# Patient Record
Sex: Male | Born: 1950 | Race: Black or African American | Hispanic: No | Marital: Single | Smoking: Former smoker
Health system: Southern US, Community
[De-identification: ages and names within clinical notes are randomized; demographics above are authoritative.]

## PROBLEM LIST (undated history)

## (undated) DIAGNOSIS — G4730 Sleep apnea, unspecified: Secondary | ICD-10-CM

## (undated) DIAGNOSIS — E7800 Pure hypercholesterolemia, unspecified: Secondary | ICD-10-CM

## (undated) DIAGNOSIS — I10 Essential (primary) hypertension: Secondary | ICD-10-CM

## (undated) DIAGNOSIS — E119 Type 2 diabetes mellitus without complications: Secondary | ICD-10-CM

## (undated) HISTORY — DX: Sleep apnea, unspecified: G47.30

## (undated) HISTORY — PX: HERNIA REPAIR: SHX51

---

## 2002-04-09 DIAGNOSIS — R079 Chest pain, unspecified: Secondary | ICD-10-CM | POA: Insufficient documentation

## 2003-02-18 ENCOUNTER — Other Ambulatory Visit: Payer: Self-pay

## 2003-04-30 ENCOUNTER — Other Ambulatory Visit: Payer: Self-pay

## 2004-01-06 ENCOUNTER — Emergency Department: Payer: Self-pay | Admitting: Emergency Medicine

## 2004-04-22 ENCOUNTER — Ambulatory Visit: Payer: Self-pay

## 2004-05-02 ENCOUNTER — Emergency Department: Payer: Self-pay | Admitting: Emergency Medicine

## 2004-07-27 ENCOUNTER — Ambulatory Visit: Payer: Self-pay | Admitting: Internal Medicine

## 2004-08-07 ENCOUNTER — Emergency Department: Payer: Self-pay | Admitting: Emergency Medicine

## 2005-07-14 ENCOUNTER — Emergency Department: Payer: Self-pay | Admitting: Internal Medicine

## 2005-10-13 ENCOUNTER — Emergency Department: Payer: Self-pay | Admitting: General Practice

## 2006-02-07 ENCOUNTER — Emergency Department: Payer: Self-pay | Admitting: Emergency Medicine

## 2006-07-07 ENCOUNTER — Emergency Department: Payer: Self-pay | Admitting: Emergency Medicine

## 2006-08-01 ENCOUNTER — Emergency Department: Payer: Self-pay | Admitting: Emergency Medicine

## 2006-08-25 ENCOUNTER — Emergency Department: Payer: Self-pay | Admitting: Emergency Medicine

## 2006-09-07 ENCOUNTER — Emergency Department: Payer: Self-pay

## 2006-10-28 ENCOUNTER — Ambulatory Visit: Payer: Self-pay | Admitting: Family Medicine

## 2006-12-24 ENCOUNTER — Emergency Department: Payer: Self-pay

## 2007-01-15 ENCOUNTER — Emergency Department: Payer: Self-pay | Admitting: Internal Medicine

## 2007-01-15 ENCOUNTER — Other Ambulatory Visit: Payer: Self-pay

## 2007-05-30 ENCOUNTER — Other Ambulatory Visit: Payer: Self-pay

## 2007-05-31 ENCOUNTER — Observation Stay: Payer: Self-pay | Admitting: *Deleted

## 2007-09-27 ENCOUNTER — Emergency Department: Payer: Self-pay

## 2008-04-24 ENCOUNTER — Emergency Department: Payer: Self-pay | Admitting: Emergency Medicine

## 2008-05-21 ENCOUNTER — Ambulatory Visit: Payer: Self-pay | Admitting: Internal Medicine

## 2008-06-13 ENCOUNTER — Ambulatory Visit: Payer: Self-pay | Admitting: Internal Medicine

## 2008-06-24 ENCOUNTER — Ambulatory Visit: Payer: Self-pay | Admitting: Specialist

## 2008-12-03 ENCOUNTER — Emergency Department: Payer: Self-pay | Admitting: Emergency Medicine

## 2010-01-05 ENCOUNTER — Encounter: Payer: Self-pay | Admitting: Internal Medicine

## 2010-03-07 ENCOUNTER — Emergency Department: Payer: Self-pay | Admitting: Emergency Medicine

## 2010-10-04 ENCOUNTER — Emergency Department: Payer: Self-pay | Admitting: Emergency Medicine

## 2010-11-06 ENCOUNTER — Emergency Department: Payer: Self-pay | Admitting: Emergency Medicine

## 2011-09-14 ENCOUNTER — Emergency Department: Payer: Self-pay | Admitting: *Deleted

## 2012-02-27 ENCOUNTER — Emergency Department: Payer: Self-pay | Admitting: Emergency Medicine

## 2012-03-13 ENCOUNTER — Ambulatory Visit: Payer: Self-pay | Admitting: General Surgery

## 2012-03-13 LAB — CBC WITH DIFFERENTIAL/PLATELET
Basophil #: 0 10*3/uL (ref 0.0–0.1)
Basophil %: 0.6 %
HGB: 12.5 g/dL — ABNORMAL LOW (ref 13.0–18.0)
Lymphocyte %: 37.3 %
MCV: 84 fL (ref 80–100)
Monocyte %: 11.3 %
RBC: 4.42 10*6/uL (ref 4.40–5.90)
RDW: 15.9 % — ABNORMAL HIGH (ref 11.5–14.5)

## 2012-03-20 ENCOUNTER — Ambulatory Visit: Payer: Self-pay | Admitting: General Surgery

## 2012-03-21 LAB — PATHOLOGY REPORT

## 2012-04-07 ENCOUNTER — Emergency Department: Payer: Self-pay | Admitting: Emergency Medicine

## 2012-04-07 LAB — URINALYSIS, COMPLETE
Bacteria: NONE SEEN
Bilirubin,UR: NEGATIVE
Blood: NEGATIVE
Glucose,UR: NEGATIVE mg/dL (ref 0–75)
Ketone: NEGATIVE
Leukocyte Esterase: NEGATIVE
Nitrite: NEGATIVE
Ph: 5 (ref 4.5–8.0)
Protein: NEGATIVE
RBC,UR: <1 /HPF (ref 0–5)
Specific Gravity: 1.009 (ref 1.003–1.030)
Squamous Epithelial: NONE SEEN
WBC UR: NONE SEEN /HPF (ref 0–5)

## 2012-10-23 ENCOUNTER — Ambulatory Visit: Payer: Self-pay | Admitting: Gastroenterology

## 2012-10-24 LAB — PATHOLOGY REPORT

## 2013-01-08 ENCOUNTER — Emergency Department: Payer: Self-pay | Admitting: Emergency Medicine

## 2013-01-29 ENCOUNTER — Emergency Department: Payer: Self-pay | Admitting: Emergency Medicine

## 2013-01-29 LAB — BASIC METABOLIC PANEL
BUN: 20 mg/dL — ABNORMAL HIGH (ref 7–18)
Calcium, Total: 8.7 mg/dL (ref 8.5–10.1)
Co2: 25 mmol/L (ref 21–32)
EGFR (African American): >60
Glucose: 188 mg/dL — ABNORMAL HIGH (ref 65–99)
Osmolality: 272 (ref 275–301)
Potassium: 4.3 mmol/L (ref 3.5–5.1)
Sodium: 132 mmol/L — ABNORMAL LOW (ref 136–145)

## 2013-01-29 LAB — URINALYSIS, COMPLETE
Glucose,UR: 50 mg/dL (ref 0–75)
Leukocyte Esterase: NEGATIVE
Nitrite: NEGATIVE
Squamous Epithelial: 1
WBC UR: <1 /HPF (ref 0–5)

## 2013-01-29 LAB — CBC WITH DIFFERENTIAL/PLATELET
Basophil %: 0.4 %
Eosinophil %: 1.9 %
HGB: 11.9 g/dL — ABNORMAL LOW (ref 13.0–18.0)
Lymphocyte %: 29 %
MCH: 28.8 pg (ref 26.0–34.0)
MCHC: 33.9 g/dL (ref 32.0–36.0)
MCV: 85 fL (ref 80–100)
Monocyte #: 0.8 x10 3/mm (ref 0.2–1.0)
Monocyte %: 11 %
Neutrophil #: 4.3 10*3/uL (ref 1.4–6.5)
Neutrophil %: 57.7 %
Platelet: 261 10*3/uL (ref 150–440)
RBC: 4.15 10*6/uL — ABNORMAL LOW (ref 4.40–5.90)
RDW: 15.6 % — ABNORMAL HIGH (ref 11.5–14.5)

## 2013-01-29 LAB — TROPONIN I: Troponin-I: <0.02 ng/mL

## 2013-10-16 ENCOUNTER — Emergency Department: Payer: Self-pay | Admitting: Emergency Medicine

## 2013-11-14 ENCOUNTER — Ambulatory Visit: Payer: Self-pay | Admitting: Ophthalmology

## 2014-04-20 ENCOUNTER — Emergency Department: Payer: Self-pay | Admitting: Emergency Medicine

## 2014-06-20 NOTE — Op Note (Signed)
PATIENT NAME:  [name], Randy SHATTO MR#:  [id number] DATE OF BIRTH:  07-Jan-1951  DATE OF PROCEDURE:  03/20/2012  PREOPERATIVE DIAGNOSIS:  Partially incarcerated ventral hernia.   POSTOPERATIVE DIAGNOSIS: Incarcerated ventral hernia with omentum.   OPERATION: Repair of incarcerated ventral hernia, partial omentectomy, use of prosthetic mesh   SURGEON: Mckinley Jewel, MD   ANESTHESIA: General.   COMPLICATIONS: None.   ESTIMATED BLOOD LOSS: Less than 25 mL.   DRAINS: None.   DESCRIPTION OF PROCEDURE: The patient was put to sleep in the supine position on the operating table. The area at the umbilicus and above the umbilicus was prepped and draped out as a sterile field. Vertical skin incision was made extending from about 5 cm above the umbilicus and through the umbilicus to the inferior portion, and carefully dissection was performed to expose a large hernial protrusion which contained omental tissue which could not be reduced easily. It was also significantly scarred and fibrotic in places. The entire hernial protrusion was then dissected off from the surrounding subcutaneous tissue down to the fascia. The patient had a transversely-oriented 3 to 3.5 cm fascial defect. The sac was excised out. The omental tissue that was incarcerated was approximately 6 cm in diameter, and given the fibrotic nature it was decided to remove this. At a normal-appearing portion of the omentum, it was serially clamped, cut and ligated with 2-0 Vicryl and the compromised omentum removed. Following this, the remaining omentum was reduced back into the abdominal cavity. The fascial edges were well outlined. With the fascial edges lifted up, the peritoneum underneath was dissected off from the posterior aspect of the fascia all around for placement of a mesh in the preperitoneal space. After this was achieved, the peritoneal opening was closed with 2-0 Vicryl running stitch. The space was measured. A 6 cm circular Ventral  Proceed Patch was brought up to the field. It was then positioned in the preperitoneal space. The fascial opening was then reapproximated with interrupted figure-of-eight stitches of 0 Prolene incorporating the anterior leaves, and the excess of the anterior leaves were trimmed with the fascial edge. A satisfactory reinforced repair was obtained. The wound was then irrigated. Subcutaneous tissue was closed in 2 layers with 2-0 Vicryl and 3-0 Vicryl, and the skin was approximated with subcuticular 4-0 Vicryl, covered with Dermabond. The procedure was well tolerated. He was subsequently extubated and returned to the recovery room in stable condition.   ____________________________ S.Robinette Haines, MD sgs:cb D: 03/21/2012 09:16:05 ET T: 03/21/2012 10:05:55 ET JOB#: [id number]  cc: Synthia Innocent. Jamal Collin, MD, <Dictator> [facility] Robinette Haines MD ELECTRONICALLY SIGNED 03/22/2012 7:35

## 2014-11-27 ENCOUNTER — Emergency Department
Admission: EM | Admit: 2014-11-27 | Discharge: 2014-11-27 | Disposition: A | Discharge status: Home / Self Care | Payer: Medicare Other | Attending: Emergency Medicine | Admitting: Emergency Medicine

## 2014-11-27 ENCOUNTER — Encounter: Payer: Self-pay | Admitting: Emergency Medicine

## 2014-11-27 DIAGNOSIS — I10 Essential (primary) hypertension: Secondary | ICD-10-CM | POA: Insufficient documentation

## 2014-11-27 DIAGNOSIS — M545 Low back pain, unspecified: Secondary | ICD-10-CM

## 2014-11-27 DIAGNOSIS — Z87891 Personal history of nicotine dependence: Secondary | ICD-10-CM | POA: Insufficient documentation

## 2014-11-27 DIAGNOSIS — E119 Type 2 diabetes mellitus without complications: Secondary | ICD-10-CM | POA: Insufficient documentation

## 2014-11-27 HISTORY — DX: Essential (primary) hypertension: I10

## 2014-11-27 HISTORY — DX: Type 2 diabetes mellitus without complications: E11.9

## 2014-11-27 LAB — URINALYSIS COMPLETE WITH MICROSCOPIC (ARMC ONLY)
BACTERIA UA: NONE SEEN
Bilirubin Urine: NEGATIVE
Glucose, UA: NEGATIVE mg/dL
Hgb urine dipstick: NEGATIVE
KETONES UR: NEGATIVE mg/dL
LEUKOCYTES UA: NEGATIVE
Nitrite: NEGATIVE
PH: 5 (ref 5.0–8.0)
Protein, ur: NEGATIVE mg/dL
RBC / HPF: NONE SEEN RBC/hpf (ref 0–5)
Specific Gravity, Urine: 1.005 (ref 1.005–1.030)

## 2014-11-27 MED ORDER — DIAZEPAM 2 MG PO TABS: 2.0000 mg | ORAL_TABLET | Freq: Three times a day (TID) | ORAL | Status: DC | PRN

## 2014-11-27 MED ORDER — KETOROLAC TROMETHAMINE 60 MG/2ML IM SOLN
60.0000 mg | Freq: Once | INTRAMUSCULAR | Status: AC
  Administered 2014-11-27: 60 mg via INTRAMUSCULAR
  Filled 2014-11-27: qty 2

## 2014-11-27 MED ORDER — ETODOLAC 400 MG PO TABS: 400.0000 mg | ORAL_TABLET | Freq: Two times a day (BID) | ORAL | Status: DC

## 2014-11-27 NOTE — ED Provider Notes (Signed)
[facility] Emergency Department Provider Note  ____________________________________________  Time seen: Approximately 7:24 AM  I have reviewed the triage vital signs and the nursing notes.   HISTORY  Chief Complaint Back Pain   HPI Randy [name] is a 64 y.o. male is here complaining of left lower back pain this week. Patient states he did not have an actual injury but has been doing yardwork. He states that this happened last year and feels the same. He has not taken any over-the-counter medications because "they don't work". He denies any dysuria, hematuria, nausea or vomiting, fever or chills. Family history for kidney stones and 9. Patient denies any paresthesias from his back into his left lower extremity. Pain is constant and 10 out of 10. However initially patient told the nurse that his pain was 15 out of 10.   Past Medical History  Diagnosis Date  . Hypertension   . Diabetes mellitus without complication     There are no active problems to display for this patient.   History reviewed. No pertinent past surgical history.  Current Outpatient Rx  Name  Route  Sig  Dispense  Refill  . diazepam (VALIUM) 2 MG tablet   Oral   Take 1 tablet (2 mg total) by mouth every 8 (eight) hours as needed for muscle spasms.   9 tablet   0   . etodolac (LODINE) 400 MG tablet   Oral   Take 1 tablet (400 mg total) by mouth 2 (two) times daily.   20 tablet   0     Allergies Review of patient's allergies indicates no known allergies.  History reviewed. No pertinent family history.  Social History Social History  Substance Use Topics  . Smoking status: Former Research scientist (life sciences)  . Smokeless tobacco: None  . Alcohol Use: Yes    Review of Systems Constitutional: No fever/chills Eyes: No visual changes. ENT: No sore throat. Cardiovascular: Denies chest pain. Respiratory: Denies shortness of breath. Gastrointestinal: No abdominal pain.  No nausea, no  vomiting.  No diarrhea.  No constipation. Genitourinary: Negative for dysuria. Musculoskeletal: Positive for back pain. Skin: Negative for rash. Neurological: Negative for headaches, focal weakness or numbness.  10-point ROS otherwise negative.  ____________________________________________   PHYSICAL EXAM:  VITAL SIGNS: ED Triage Vitals  Enc Vitals Group     BP --      Pulse --      Resp --      Temp --      Temp src --      SpO2 --      Weight --      Height --      Head Cir --      Peak Flow --      Pain Score --      Pain Loc --      Pain Edu? --      Excl. in [place]? --     Constitutional: Alert and oriented. Well appearing and in no acute distress. Eyes: Conjunctivae are normal. PERRL. EOMI. Head: Atraumatic. Nose: No congestion/rhinnorhea. Neck: No stridor.   Cardiovascular: Normal rate, regular rhythm. Grossly normal heart sounds.  Good peripheral circulation. Respiratory: Normal respiratory effort.  No retractions. Lungs CTAB. Gastrointestinal: Soft and nontender. No distention. Bowel sounds normoactive 4 Musculoskeletal: No gross deformity was noted on examination of the back. There is left lower paravertebral muscle tenderness. Range of motion is slow and guarded but no active muscle spasms are seen. Straight leg raises  on the left arm approximately 45 with discomfort. No lower extremity tenderness nor edema.  No joint effusions. Neurologic:  Normal speech and language. No gross focal neurologic deficits are appreciated. No gait instability. Reflexes 2+ and equal bilaterally Skin:  Skin is warm, dry and intact. No rash noted. Psychiatric: Mood and affect are normal. Speech and behavior are normal.  ____________________________________________   LABS (all labs ordered are listed, but only abnormal results are displayed)  Labs Reviewed  URINALYSIS COMPLETEWITH MICROSCOPIC ([place]) - Abnormal; Notable for the following:    Color, Urine STRAW (*)     APPearance CLEAR (*)    Squamous Epithelial / LPF 0-5 (*)    All other components within normal limits   RADIOLOGY deferred ____________________________________________   PROCEDURES  Procedure(s) performed: None  Critical Care performed: No  ____________________________________________   INITIAL IMPRESSION / ASSESSMENT AND PLAN / ED COURSE  Pertinent labs & imaging results that were available during my care of the patient were reviewed by me and considered in my medical decision making (see chart for details).  Urinalysis was negative. Patient was told that this is more muscle skeletal. He was discharged on etodolac and Valium 2 mg 3 times a day for 3 days. He is to use ice or heat to his muscles. He is to follow-up with his PCP or orthopedist if any continued problems. ____________________________________________   FINAL CLINICAL IMPRESSION(S) / ED DIAGNOSES  Final diagnoses:  Left-sided low back pain without sciatica      Johnn Hai, PA-C 11/27/14 1159  Orbie Pyo, MD 11/27/14 1212

## 2014-11-27 NOTE — Discharge Instructions (Signed)
Follow-up with your doctor if not improving in 5-7 days. Take medication as directed. Use moist heat or ice to muscle area as needed

## 2014-11-27 NOTE — ED Notes (Addendum)
Pt to ed with c/o left side lower back pain this week.  States he was doing yard work earlier in the week and thinks he may have worked too hard. Pt rates pain 15/10, when reexplained pain scale he still states pain is greater than 10.  Pt laughing during assessment.

## 2015-01-06 ENCOUNTER — Ambulatory Visit: Payer: Medicare Other

## 2015-01-16 ENCOUNTER — Ambulatory Visit: Payer: Medicare Other | Admitting: Physical Therapy

## 2015-01-19 ENCOUNTER — Ambulatory Visit: Payer: Medicare Other | Admitting: Physical Therapy

## 2015-01-27 ENCOUNTER — Encounter: Payer: Medicare Other | Admitting: Physical Therapy

## 2015-01-29 ENCOUNTER — Encounter: Payer: Medicare Other | Admitting: Physical Therapy

## 2015-02-02 ENCOUNTER — Encounter: Payer: Medicare Other | Admitting: Physical Therapy

## 2015-02-05 ENCOUNTER — Encounter: Payer: Medicare Other | Admitting: Physical Therapy

## 2015-04-11 ENCOUNTER — Emergency Department
Admission: EM | Admit: 2015-04-11 | Discharge: 2015-04-11 | Disposition: A | Discharge status: Home / Self Care | Payer: Medicare Other | Attending: Emergency Medicine | Admitting: Emergency Medicine

## 2015-04-11 ENCOUNTER — Encounter: Payer: Self-pay | Admitting: *Deleted

## 2015-04-11 DIAGNOSIS — M545 Low back pain, unspecified: Secondary | ICD-10-CM

## 2015-04-11 DIAGNOSIS — Z79899 Other long term (current) drug therapy: Secondary | ICD-10-CM | POA: Diagnosis not present

## 2015-04-11 DIAGNOSIS — I10 Essential (primary) hypertension: Secondary | ICD-10-CM | POA: Diagnosis not present

## 2015-04-11 DIAGNOSIS — Z87891 Personal history of nicotine dependence: Secondary | ICD-10-CM | POA: Diagnosis not present

## 2015-04-11 DIAGNOSIS — E119 Type 2 diabetes mellitus without complications: Secondary | ICD-10-CM | POA: Diagnosis not present

## 2015-04-11 DIAGNOSIS — Z794 Long term (current) use of insulin: Secondary | ICD-10-CM | POA: Insufficient documentation

## 2015-04-11 DIAGNOSIS — Z7982 Long term (current) use of aspirin: Secondary | ICD-10-CM | POA: Diagnosis not present

## 2015-04-11 HISTORY — DX: Pure hypercholesterolemia, unspecified: E78.00

## 2015-04-11 LAB — URINALYSIS COMPLETE WITH MICROSCOPIC (ARMC ONLY)
BILIRUBIN URINE: NEGATIVE
Bacteria, UA: NONE SEEN
Glucose, UA: 50 mg/dL — AB
Hgb urine dipstick: NEGATIVE
KETONES UR: NEGATIVE mg/dL
Leukocytes, UA: NEGATIVE
Nitrite: NEGATIVE
Protein, ur: NEGATIVE mg/dL
RBC / HPF: NONE SEEN RBC/hpf (ref 0–5)
Specific Gravity, Urine: 1.016 (ref 1.005–1.030)
pH: 5 (ref 5.0–8.0)

## 2015-04-11 MED ORDER — DIAZEPAM 2 MG PO TABS
2.0000 mg | ORAL_TABLET | Freq: Once | ORAL | Status: AC
  Administered 2015-04-11: 2 mg via ORAL
  Filled 2015-04-11: qty 1

## 2015-04-11 MED ORDER — DIAZEPAM 2 MG PO TABS: 2.0000 mg | ORAL_TABLET | Freq: Three times a day (TID) | ORAL | Status: DC | PRN

## 2015-04-11 MED ORDER — KETOROLAC TROMETHAMINE 60 MG/2ML IM SOLN
60.0000 mg | Freq: Once | INTRAMUSCULAR | Status: AC
  Administered 2015-04-11: 60 mg via INTRAMUSCULAR
  Filled 2015-04-11: qty 2

## 2015-04-11 MED ORDER — ETODOLAC 400 MG PO TABS: 400.0000 mg | ORAL_TABLET | Freq: Two times a day (BID) | ORAL | Status: DC

## 2015-04-11 MED ORDER — OXYCODONE-ACETAMINOPHEN 5-325 MG PO TABS
1.0000 | ORAL_TABLET | Freq: Once | ORAL | Status: AC
  Administered 2015-04-11: 1 via ORAL
  Filled 2015-04-11: qty 1

## 2015-04-11 NOTE — Discharge Instructions (Signed)
Back Pain, Adult Back pain is very common. The pain often gets better over time. The cause of back pain is usually not dangerous. Most people can learn to manage their back pain on their own.  HOME CARE  Watch your back pain for any changes. The following actions may help to lessen any pain you are feeling:  Stay active. Start with short walks on flat ground if you can. Try to walk farther each day.  Exercise regularly as told by your doctor. Exercise helps your back heal faster. It also helps avoid future injury by keeping your muscles strong and flexible.  Do not sit, drive, or stand in one place for more than 30 minutes.  Do not stay in bed. Resting more than 1-2 days can slow down your recovery.  Be careful when you bend or lift an object. Use good form when lifting:  Bend at your knees.  Keep the object close to your body.  Do not twist.  Sleep on a firm mattress. Lie on your side, and bend your knees. If you lie on your back, put a pillow under your knees.  Take medicines only as told by your doctor.  Put ice on the injured area.  Put ice in a plastic bag.  Place a towel between your skin and the bag.  Leave the ice on for 20 minutes, 2-3 times a day for the first 2-3 days. After that, you can switch between ice and heat packs.  Avoid feeling anxious or stressed. Find good ways to deal with stress, such as exercise.  Maintain a healthy weight. Extra weight puts stress on your back. GET HELP IF:   You have pain that does not go away with rest or medicine.  You have worsening pain that goes down into your legs or buttocks.  You have pain that does not get better in one week.  You have pain at night.  You lose weight.  You have a fever or chills. GET HELP RIGHT AWAY IF:   You cannot control when you poop (bowel movement) or pee (urinate).  Your arms or legs feel weak.  Your arms or legs lose feeling (numbness).  You feel sick to your stomach (nauseous) or  throw up (vomit).  You have belly (abdominal) pain.  You feel like you may pass out (faint).   This information is not intended to replace advice given to you by your health care provider. Make sure you discuss any questions you have with your health care provider.   Document Released: 08/03/2007 Document Revised: 03/07/2014 Document Reviewed: 06/18/2013 Elsevier Interactive Patient Education 2016 [place] will  need to see your doctor at [facility]. Call Monday to set up an appointment. If not improving you should see an orthopedist. Take Lodine and diazepam as directed. Ice and heat to back for comfort.

## 2015-04-11 NOTE — ED Provider Notes (Signed)
[facility] Emergency Department Provider Note  ____________________________________________  Time seen: Approximately 7:15 AM  I have reviewed the triage vital signs and the nursing notes.   HISTORY  Chief Complaint Back Pain   HPI Randy [name] is a 65 y.o. male bilateral low back pain5 days. Patient denies any history of injury. He states that he does a lot of repetition movements and has had problems with his back in the past. Patient states that standing and moving increases his pain. He denies any radiation into his legs, denies incontinence of bowel or bladder. He is not taking any other counter medication for his pain. He denies any urinary symptoms or history of kidney stone. Patient states that he has had pain off and on for many years but has never seen an orthopedist. He also has ongoing seen his primary care doctor for his diabetes and is never mentioned to his back problems. Currently he rates his pain as 10 over 10.   Past Medical History  Diagnosis Date  . Hypertension   . Diabetes mellitus without complication ([place])   . Hypercholesteremia     There are no active problems to display for this patient.   Past Surgical History  Procedure Laterality Date  . Hernia repair      Current Outpatient Rx  Name  Route  Sig  Dispense  Refill  . aspirin 81 MG tablet   Oral   Take 81 mg by mouth daily.         Marland Kitchen atorvastatin (LIPITOR) 40 MG tablet   Oral   Take 40 mg by mouth daily.         . insulin glargine (LANTUS) 100 UNIT/ML injection   Subcutaneous   Inject 50 Units into the skin 2 (two) times daily.         . insulin lispro (HUMALOG) 100 UNIT/ML injection   Subcutaneous   Inject 30 Units into the skin 3 (three) times daily with meals.         Marland Kitchen lisinopril (PRINIVIL,ZESTRIL) 10 MG tablet   Oral   Take 10 mg by mouth daily.         . tamsulosin (FLOMAX) 0.4 MG CAPS capsule   Oral   Take 0.4 mg by mouth.         .  diazepam (VALIUM) 2 MG tablet   Oral   Take 1 tablet (2 mg total) by mouth every 8 (eight) hours as needed for muscle spasms.   9 tablet   0   . etodolac (LODINE) 400 MG tablet   Oral   Take 1 tablet (400 mg total) by mouth 2 (two) times daily.   20 tablet   0     Allergies Review of patient's allergies indicates no known allergies.  History reviewed. No pertinent family history.  Social History Social History  Substance Use Topics  . Smoking status: Former Smoker    Types: Cigarettes  . Smokeless tobacco: Never Used  . Alcohol Use: Yes     Comment: infreqently    Review of Systems Constitutional: No fever/chills Cardiovascular: Denies chest pain. Respiratory: Denies shortness of breath. Gastrointestinal: No abdominal pain.  No nausea, no vomiting.   Genitourinary: Negative for dysuria. Musculoskeletal: Positive for low back pain. Skin: Negative for rash. Neurological: Negative for headaches, focal weakness or numbness.  10-point ROS otherwise negative.  ____________________________________________   PHYSICAL EXAM:  VITAL SIGNS: ED Triage Vitals  Enc Vitals Group  BP 04/11/15 0425 138/72 mmHg     Pulse Rate 04/11/15 0425 88     Resp 04/11/15 0425 20     Temp 04/11/15 0425 98.2 F (36.8 C)     Temp Source 04/11/15 0425 Oral     SpO2 04/11/15 0425 95 %     Weight 04/11/15 0425 271 lb (122.925 kg)     Height 04/11/15 0425 5\' 6"  (1.676 m)     Head Cir --      Peak Flow --      Pain Score --      Pain Loc --      Pain Edu? --      Excl. in [place]? --     Constitutional: Alert and oriented. Well appearing and in no acute distress. Patient is grossly obese. Eyes: Conjunctivae are normal. PERRL. EOMI. Head: Atraumatic. Nose: No congestion/rhinnorhea. Mouth/Throat: Mucous membranes are moist.  Oropharynx non-erythematous. Neck: No stridor.  Supple Cardiovascular: Normal rate, regular rhythm. Grossly normal heart sounds.  Good peripheral  circulation. Respiratory: Normal respiratory effort.  No retractions. Lungs CTAB. Gastrointestinal: Soft and nontender. No distention.Marland Kitchen No CVA tenderness. Musculoskeletal: Examination of the back feels show any gross abnormality. There is moderate tenderness on palpation of that. Vertebral muscles bilaterally lumbar area. There is no active muscle spasm seen however patient is having difficult time getting from a sitting to standing position and also from a supine position to sitting. Straight leg raise is approximately 30 with discomfort in his back. Slow guarded gait was noted. Neurologic:  Normal speech and language. No gross focal neurologic deficits are appreciated. No gait instability. Reflexes 1+ bilaterally. Skin:  Skin is warm, dry and intact. No rash noted. Psychiatric: Mood and affect are normal. Speech and behavior are normal.  ____________________________________________   LABS (all labs ordered are listed, but only abnormal results are displayed)  Labs Reviewed  URINALYSIS COMPLETEWITH MICROSCOPIC ([place]) - Abnormal; Notable for the following:    Color, Urine YELLOW (*)    APPearance CLEAR (*)    Glucose, UA 50 (*)    Squamous Epithelial / LPF 0-5 (*)    All other components within normal limits     PROCEDURES  Procedure(s) performed: None  Critical Care performed: No  ____________________________________________   INITIAL IMPRESSION / ASSESSMENT AND PLAN / ED COURSE  Pertinent labs & imaging results that were available during my care of the patient were reviewed by me and considered in my medical decision making (see chart for details).  Patient was given Toradol 60 mg IM while in the emergency room along with diazepam 2 mg. Patient was given a prescription for this diazepam 1 tablet every 8 hours for 3 days. His also given a prescription for Lodine 400 mg twice a day with food. Patient is to see his primary care doctor and possibly referral to an orthopedist  for his back pain. ____________________________________________   FINAL CLINICAL IMPRESSION(S) / ED DIAGNOSES  Final diagnoses:  Bilateral low back pain without sciatica      Johnn Hai, PA-C 04/11/15 1041  Harvest Dark, MD 04/11/15 1418

## 2015-04-11 NOTE — ED Notes (Signed)
Pt c/o low back pain x 5 days, exacerbated w/ movement. Denies radiation to legs. Pt has taken no OTC meds for his pain.

## 2015-04-11 NOTE — ED Notes (Signed)
Bilateral back and flank pain x 5 days. Denies changes in urination. Denies injury. Tender to palpation. A/o. Able to stand but increases pain. Pain 10/10.

## 2015-09-17 IMAGING — CT CT HEAD WITHOUT CONTRAST
1 series · 16 of 30 positions shown, 20 images · non-contrast
Comparison: None.

CLINICAL DATA: Syncope

EXAM:
CT HEAD WITHOUT CONTRAST
TECHNIQUE: Contiguous axial images were obtained from the base of the skull
through the vertex without intravenous contrast.

[Series 2: head wo · axial · 0.45mm/px · z∈[-32,+128]mm · 16 of 36 slices shown, 20 images]
[im 2/36  brain]
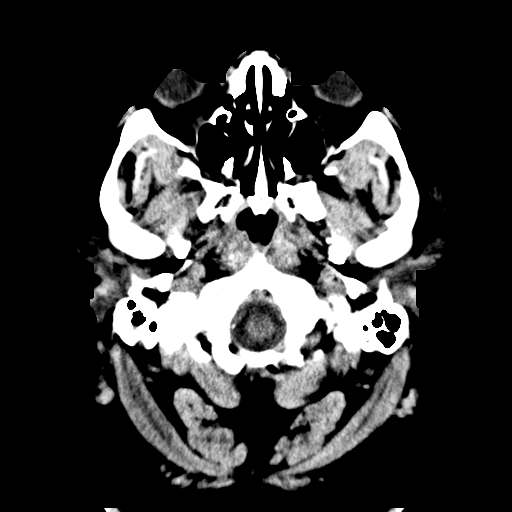
[im 2/36  bone]
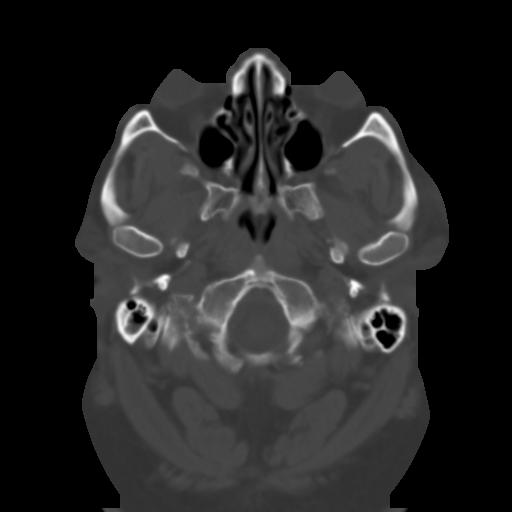
[im 4/36  brain]
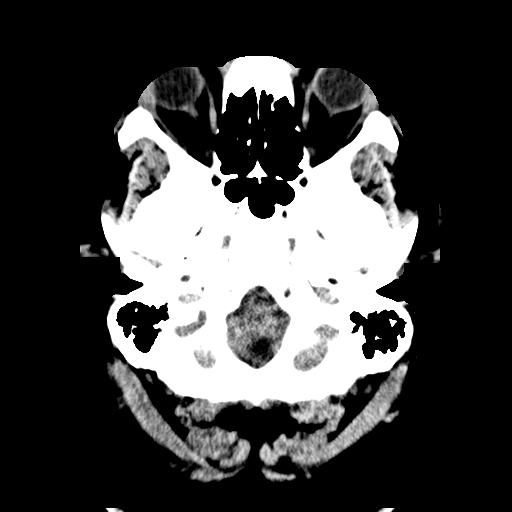
[im 7/36  brain]
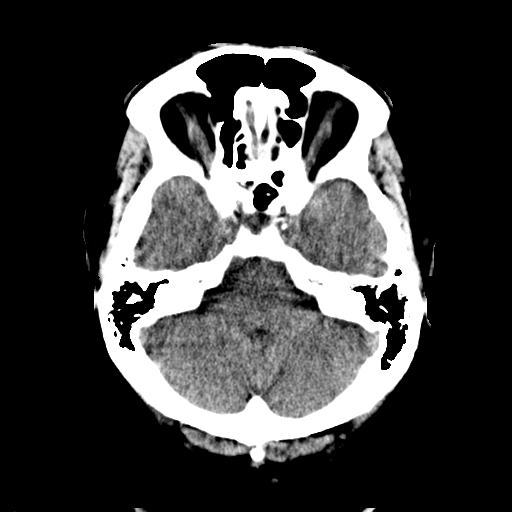
[im 9/36  brain]
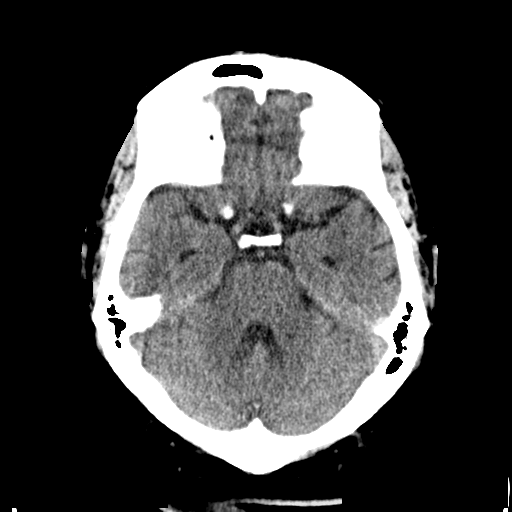
[im 10/36  brain]
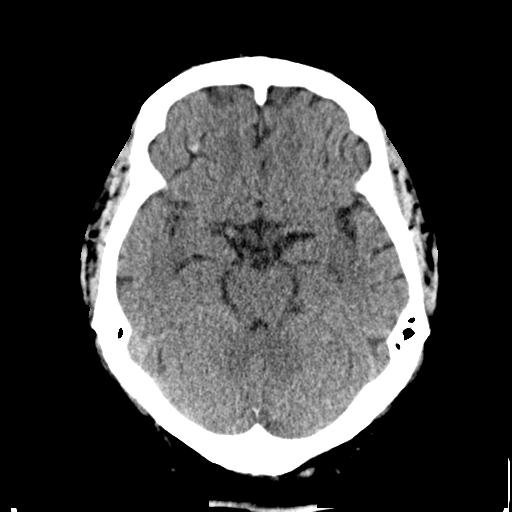
[im 10/36  bone]
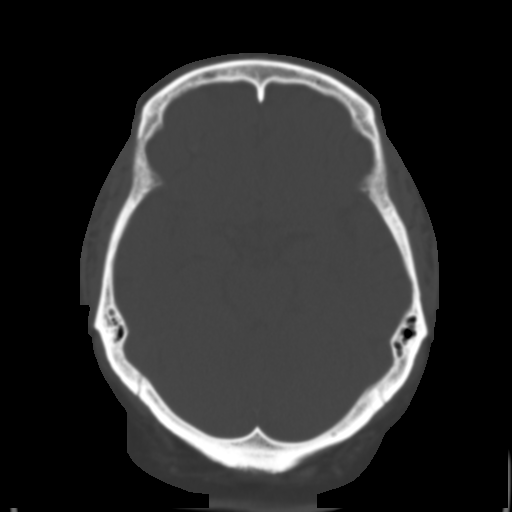
[im 13/36  brain]
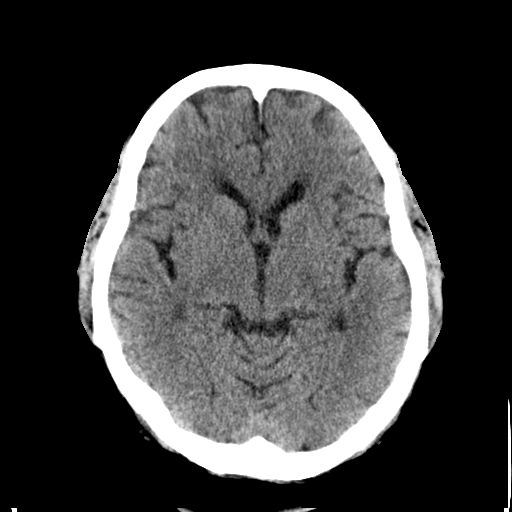
[im 15/36  brain]
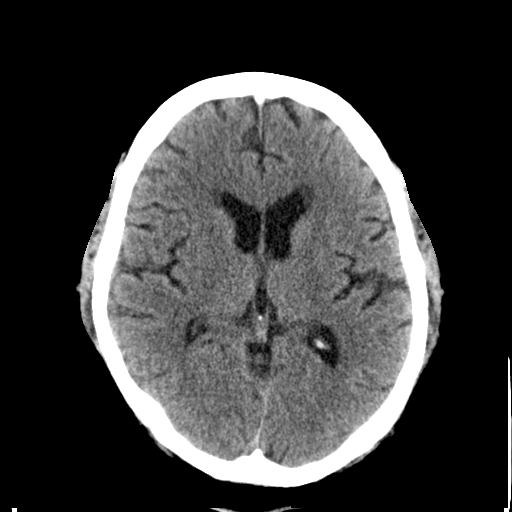
[im 17/36  brain]
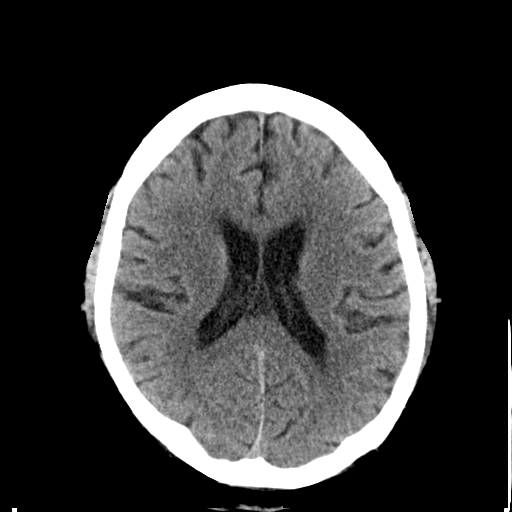
[im 19/36  brain]
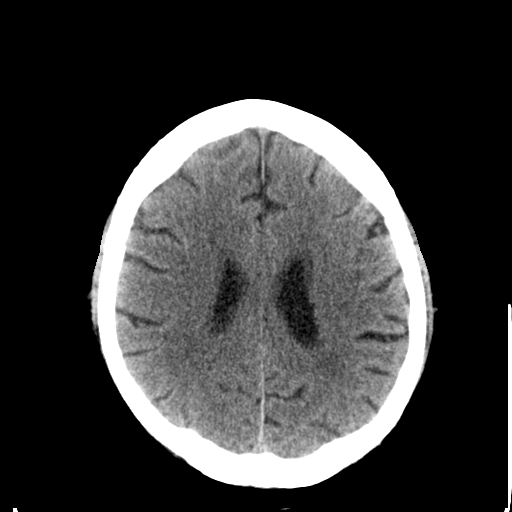
[im 19/36  bone]
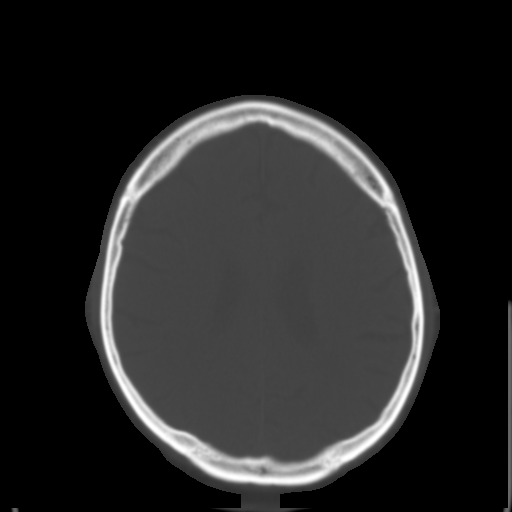
[im 21/36  brain]
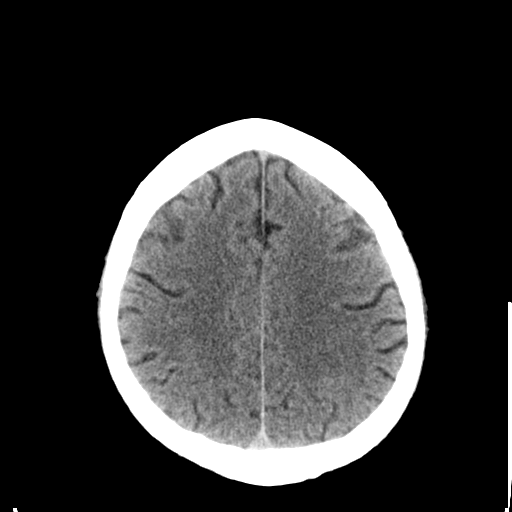
[im 23/36  brain]
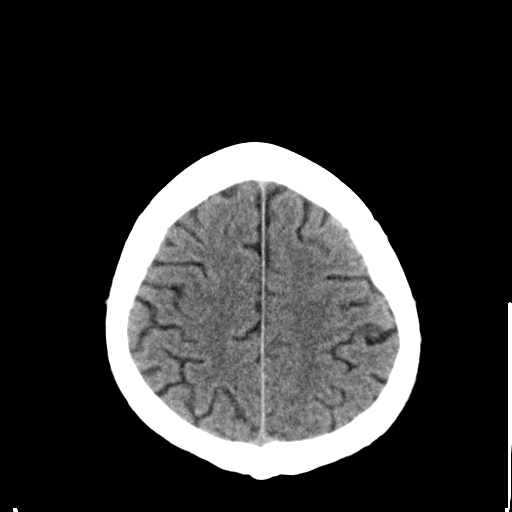
[im 26/36  brain]
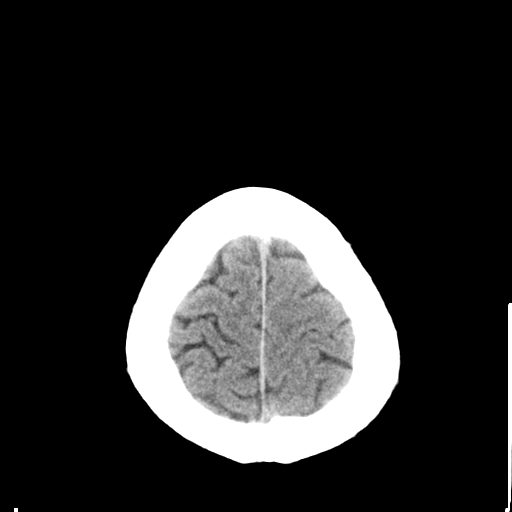
[im 27/36  brain]
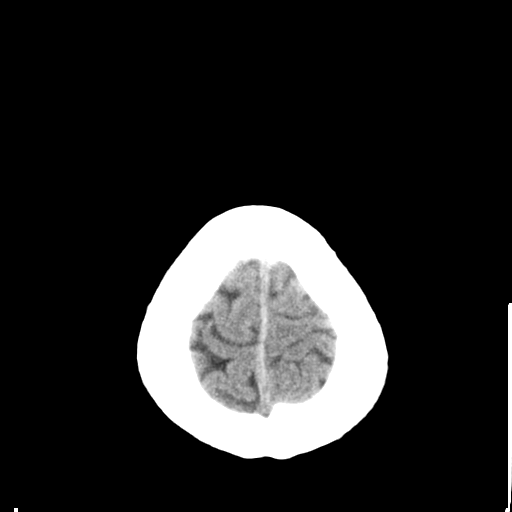
[im 27/36  bone]
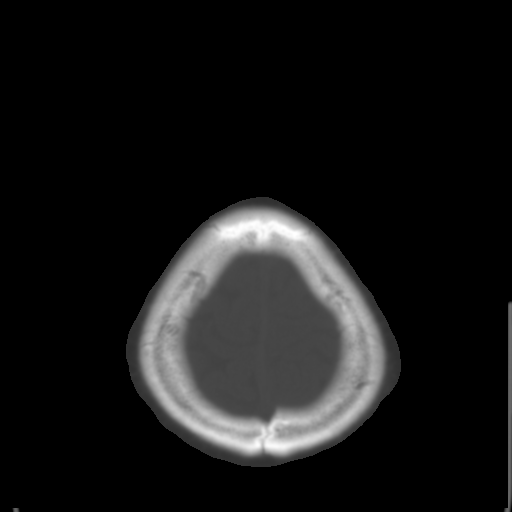
[im 29/36  brain]
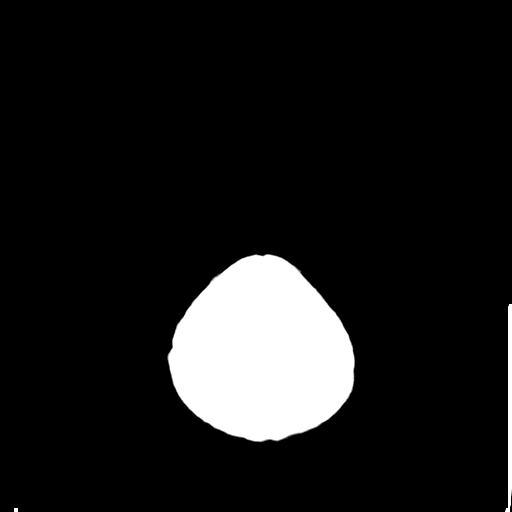
[im 32/36  brain]
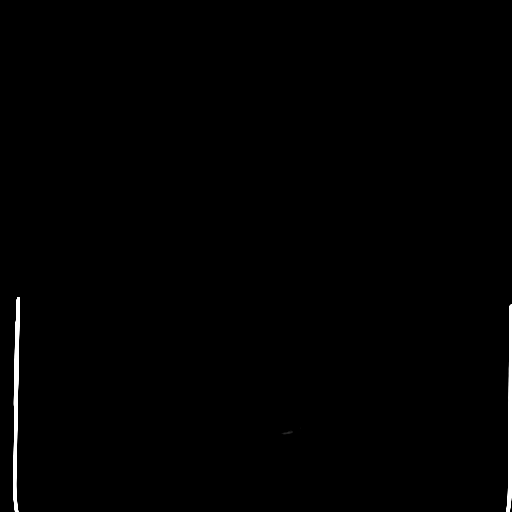
[im 34/36  brain]
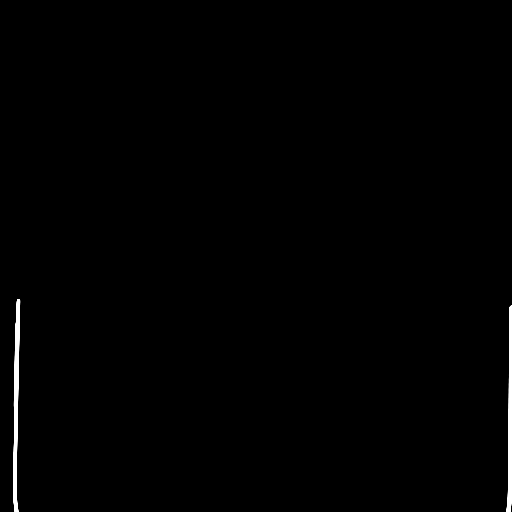

[16 of 30 positions shown; findings below may reference images not displayed]

FINDINGS: No skull fracture is noted. Paranasal sinuses and mastoid air cells
are unremarkable.

No intracranial hemorrhage, mass effect or midline shift. No acute
cortical infarction. Mild cerebral atrophy. No mass lesion is noted
on this unenhanced scan. Mild periventricular white matter decreased
attenuation probable due to chronic small vessel ischemic changes.
IMPRESSION: No acute intracranial abnormality. Mild cerebral atrophy. Mild
periventricular white matter decreased attenuation probable due to
chronic small vessel ischemic changes.

## 2015-09-17 IMAGING — CT CT MAXILLOFACIAL WITHOUT CONTRAST
3 of 4 series · 16 of 47 positions shown, 19 images · non-contrast
Comparison: None.

CLINICAL DATA: Syncopal episode with fall and injury to mandible.

EXAM:
CT MAXILLOFACIAL WITHOUT CONTRAST
TECHNIQUE: Multidetector CT imaging of the maxillofacial structures was
performed. Multiplanar CT image reconstructions were also generated.
A small metallic BB was placed on the right temple in order to
reliably differentiate right from left.

[Series 2: max soft · axial · 0.34mm/px · z∈[-274,-124]mm · 10 of 89 slices shown, 13 images]
[im 7/89  brain]
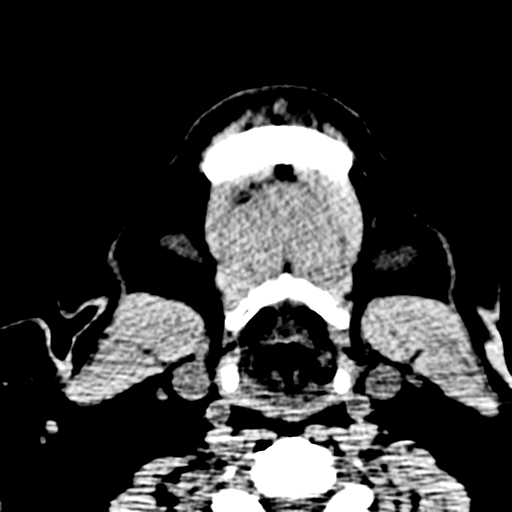
[im 7/89  bone]
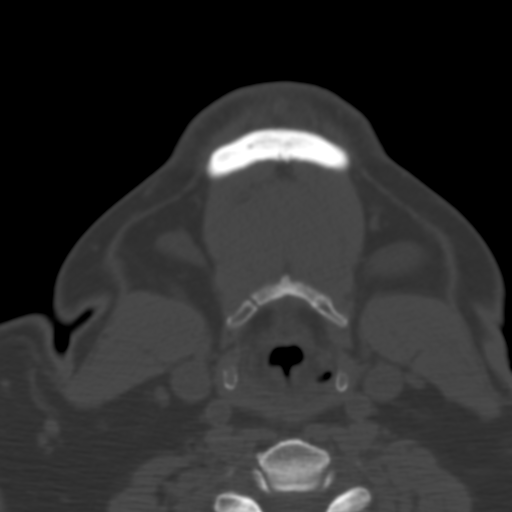
[im 16/89  bone]
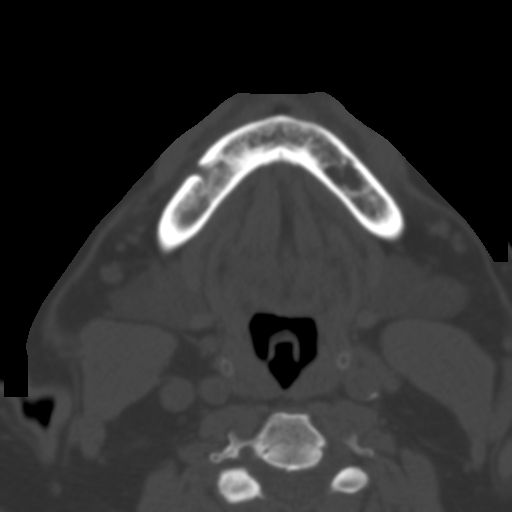
[im 25/89  bone]
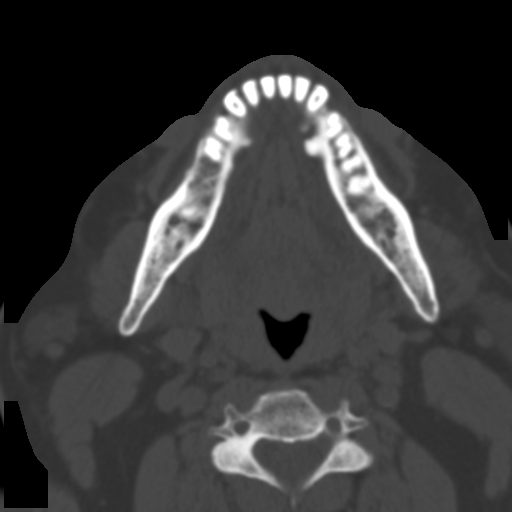
[im 31/89  bone]
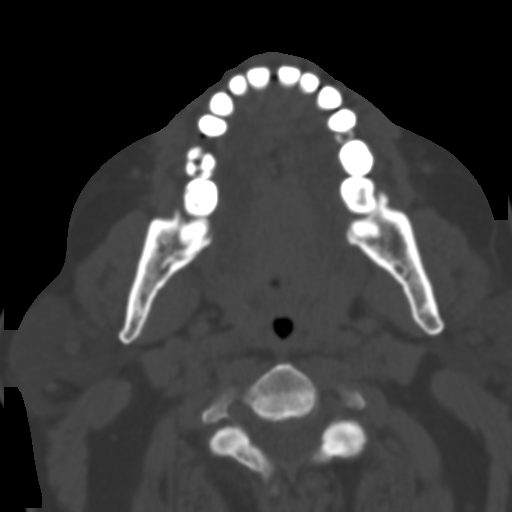
[im 40/89  brain]
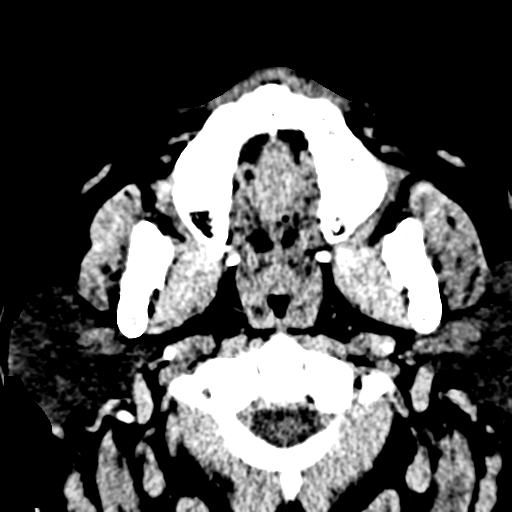
[im 40/89  bone]
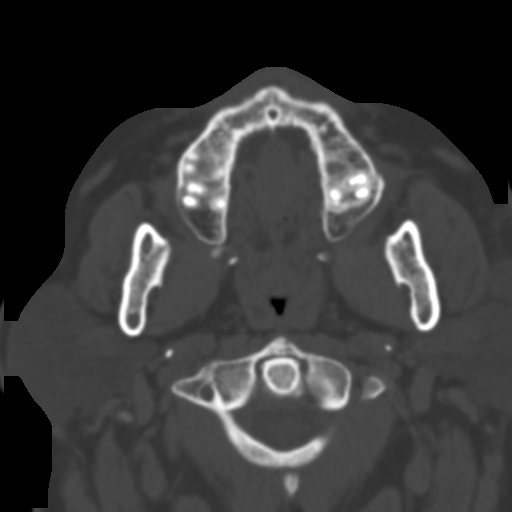
[im 49/89  bone]
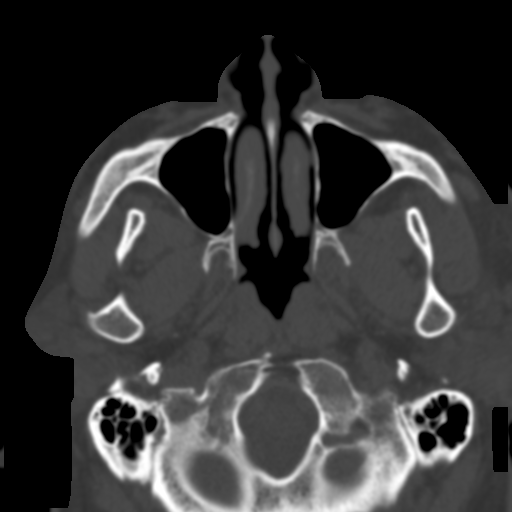
[im 58/89  bone]
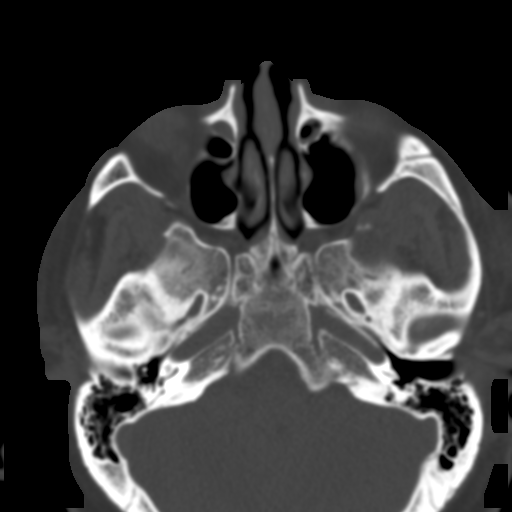
[im 67/89  bone]
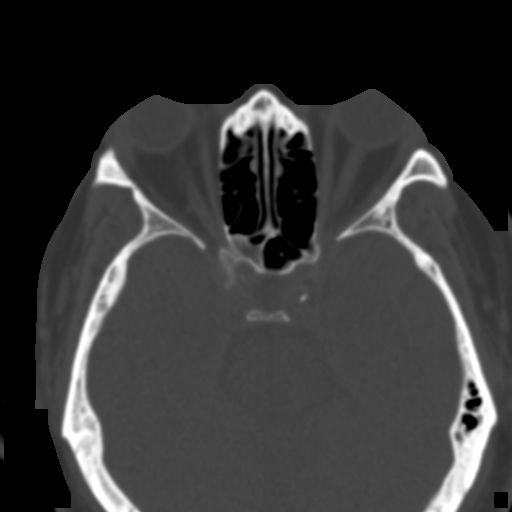
[im 73/89  brain]
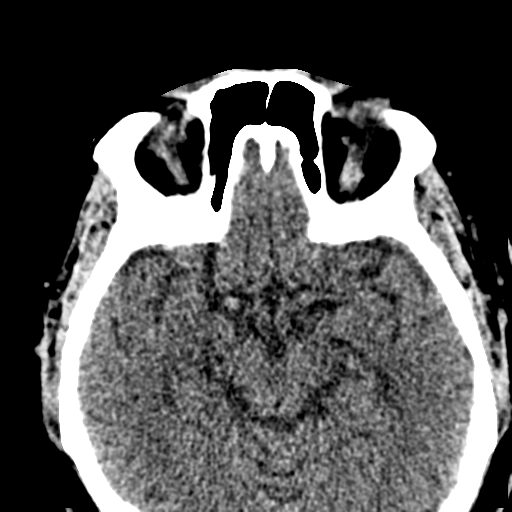
[im 73/89  bone]
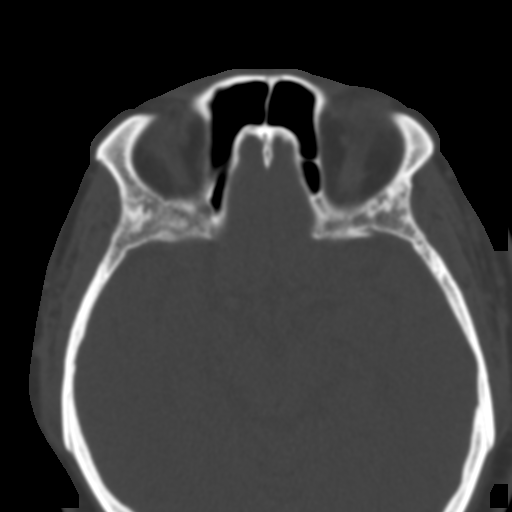
[im 82/89  bone]
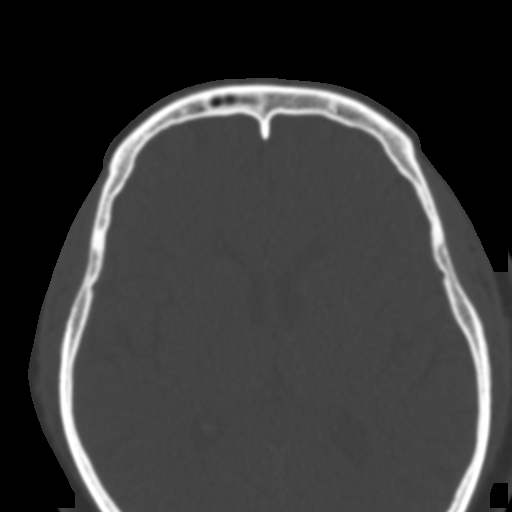

[Series 6: sagittal soft · sagittal · 0.34mm/px · 3 of 84 slices shown]
[im 28/84  bone]
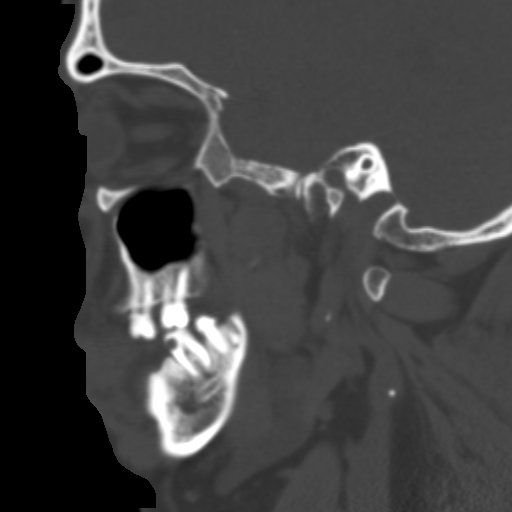
[im 42/84  bone]
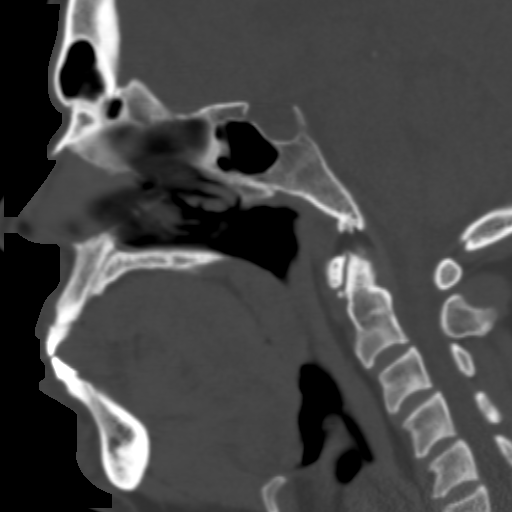
[im 56/84  bone]
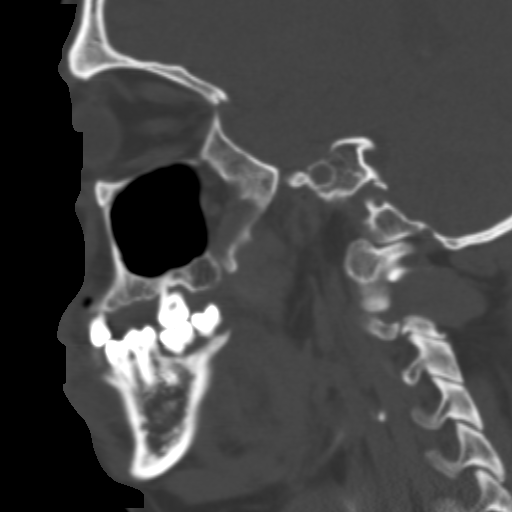

[Series 7: coronal bone · coronal · 0.33mm/px · 3 of 85 slices shown]
[im 22/85  bone]
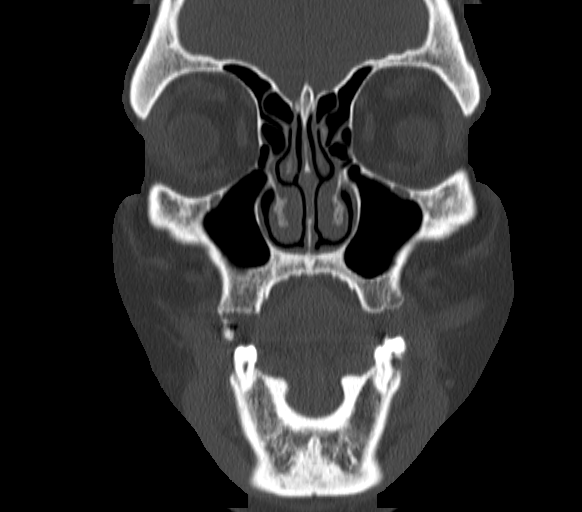
[im 43/85  bone]
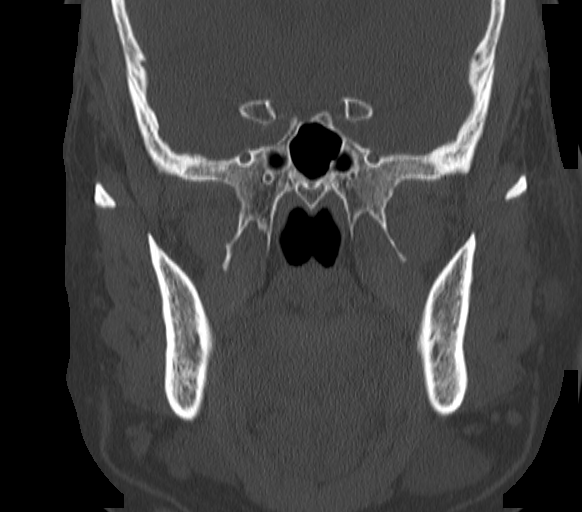
[im 64/85  bone]
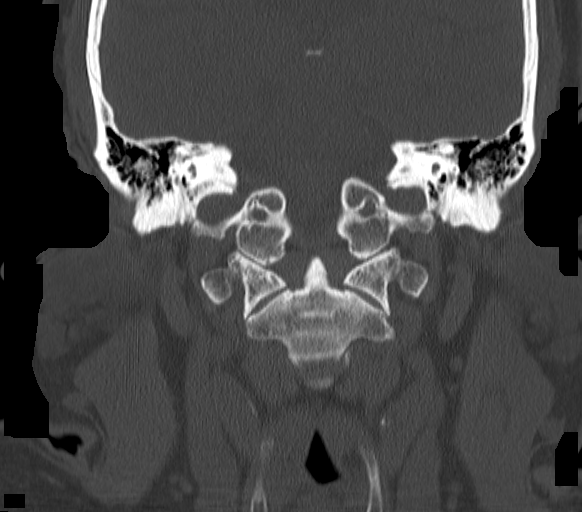

[16 of 47 positions shown; findings below may reference images not displayed]

FINDINGS: No acute fracture is identified. The paranasal sinuses are normally
aerated. Orbits are symmetric and normal in appearance.
Temporomandibular joints show normal alignment. No foreign body is
visualized. No incidental mass lesions. The visualized airway is
normally patent.
IMPRESSION: Normal maxillofacial CT.  No acute injury identified

## 2017-03-05 ENCOUNTER — Emergency Department: Payer: Medicare Other

## 2017-03-05 ENCOUNTER — Emergency Department
Admission: EM | Admit: 2017-03-05 | Discharge: 2017-03-05 | Disposition: A | Discharge status: Home / Self Care | Payer: Medicare Other | Attending: Emergency Medicine | Admitting: Emergency Medicine

## 2017-03-05 ENCOUNTER — Encounter: Payer: Self-pay | Admitting: Emergency Medicine

## 2017-03-05 DIAGNOSIS — E119 Type 2 diabetes mellitus without complications: Secondary | ICD-10-CM | POA: Insufficient documentation

## 2017-03-05 DIAGNOSIS — R0789 Other chest pain: Secondary | ICD-10-CM

## 2017-03-05 DIAGNOSIS — Z794 Long term (current) use of insulin: Secondary | ICD-10-CM | POA: Diagnosis not present

## 2017-03-05 DIAGNOSIS — J40 Bronchitis, not specified as acute or chronic: Secondary | ICD-10-CM

## 2017-03-05 DIAGNOSIS — Z87891 Personal history of nicotine dependence: Secondary | ICD-10-CM | POA: Diagnosis not present

## 2017-03-05 DIAGNOSIS — Z7982 Long term (current) use of aspirin: Secondary | ICD-10-CM | POA: Insufficient documentation

## 2017-03-05 DIAGNOSIS — I10 Essential (primary) hypertension: Secondary | ICD-10-CM | POA: Diagnosis not present

## 2017-03-05 DIAGNOSIS — Z79899 Other long term (current) drug therapy: Secondary | ICD-10-CM | POA: Diagnosis not present

## 2017-03-05 DIAGNOSIS — R05 Cough: Secondary | ICD-10-CM | POA: Diagnosis present

## 2017-03-05 LAB — CBC
HCT: 38.6 % — ABNORMAL LOW (ref 40.0–52.0)
Hemoglobin: 12.7 g/dL — ABNORMAL LOW (ref 13.0–18.0)
MCH: 27.9 pg (ref 26.0–34.0)
MCHC: 32.9 g/dL (ref 32.0–36.0)
MCV: 85 fL (ref 80.0–100.0)
PLATELETS: 278 10*3/uL (ref 150–440)
RBC: 4.55 MIL/uL (ref 4.40–5.90)
RDW: 15.3 % — AB (ref 11.5–14.5)
WBC: 5.9 10*3/uL (ref 3.8–10.6)

## 2017-03-05 LAB — GLUCOSE, CAPILLARY: Glucose-Capillary: 134 mg/dL — ABNORMAL HIGH (ref 65–99)

## 2017-03-05 LAB — BASIC METABOLIC PANEL
Anion gap: 10 (ref 5–15)
BUN: 16 mg/dL (ref 6–20)
CHLORIDE: 102 mmol/L (ref 101–111)
CO2: 24 mmol/L (ref 22–32)
CREATININE: 0.75 mg/dL (ref 0.61–1.24)
Calcium: 9 mg/dL (ref 8.9–10.3)
GFR calc Af Amer: >60 mL/min (ref >60)
GFR calc non Af Amer: >60 mL/min (ref >60)
Glucose, Bld: 148 mg/dL — ABNORMAL HIGH (ref 65–99)
Potassium: 3.9 mmol/L (ref 3.5–5.1)
SODIUM: 136 mmol/L (ref 135–145)

## 2017-03-05 LAB — TROPONIN I: Troponin I: <0.03 ng/mL (ref <0.03)

## 2017-03-05 MED ORDER — BENZONATATE 100 MG PO CAPS
100.0000 mg | ORAL_CAPSULE | Freq: Once | ORAL | Status: AC
  Administered 2017-03-05: 100 mg via ORAL
  Filled 2017-03-05: qty 1

## 2017-03-05 MED ORDER — ALBUTEROL SULFATE HFA 108 (90 BASE) MCG/ACT IN AERS: 2.0000 | INHALATION_SPRAY | Freq: Four times a day (QID) | RESPIRATORY_TRACT | 0 refills | Status: DC | PRN

## 2017-03-05 MED ORDER — BENZONATATE 100 MG PO CAPS: 100.0000 mg | ORAL_CAPSULE | Freq: Three times a day (TID) | ORAL | 0 refills | Status: DC | PRN

## 2017-03-05 MED ORDER — IPRATROPIUM-ALBUTEROL 0.5-2.5 (3) MG/3ML IN SOLN
3.0000 mL | Freq: Once | RESPIRATORY_TRACT | Status: AC
  Administered 2017-03-05: 3 mL via RESPIRATORY_TRACT
  Filled 2017-03-05: qty 3

## 2017-03-05 NOTE — ED Notes (Signed)
Patient transported to X-ray 

## 2017-03-05 NOTE — ED Notes (Signed)
The EKG was completed and signed by Dr. Lord. The EKG was also exported into the system. 

## 2017-03-05 NOTE — ED Triage Notes (Signed)
Pt arrived via ems from home. Pt reports weakness, cough, and chest pain over the last few days. Upon arrival to the ED pt alert and oriented x 4 and in no apparent distress. Pt reports chest pain on the left side of his chest and describes the pain as a dull ache with no radiating pain. Pt reports a productive cough with no OTC use.

## 2017-03-05 NOTE — Discharge Instructions (Signed)
You are evaluated for cough and chest pain with cough, and as we discussed, your exam and evaluation are overall reassuring in the emergency department today.  I am most suspicious of pains being related to the cough, which seems to be due to bronchitis.  I am recommending that you follow-up with a cardiologist to discuss whether or not you should have additional workup including possible stress test.  Return to the emergency department immediately for any worsening symptoms including chest pain, nausea, sweats, dizziness or passing out, palpitations, trouble breathing, or any other symptoms concerning to you.  Please also follow-up with your primary care doctor this week.

## 2017-03-05 NOTE — ED Provider Notes (Signed)
[facility] Emergency Department Provider Note ____________________________________________   I have reviewed the triage vital signs and the triage nursing note.  HISTORY  Chief Complaint Cough and Chest Pain   Historian Patient  HPI Randy [name] is a 67 y.o. male with a history of diabetes, obesity, hypertension and hypercholesterolemia states that he typically has a blood pressure that runs systolic around 683.  For the past 2-3 days he has had a cough and yesterday he started having some pain when he coughed and that continued this morning.  He decided to take his blood pressure because he was coughing a lot this morning and his blood pressure went up to 170/100 and that caused him enough concern to come get seen.  No known history of coronary artery disease.  No known history of asthma or emphysema.  States he had a stress test it was several years ago at this point.  He has a primary care doctor at the [facility] center.   Past Medical History:  Diagnosis Date  . Diabetes mellitus without complication ([place])   . Hypercholesteremia   . Hypertension     There are no active problems to display for this patient.   Past Surgical History:  Procedure Laterality Date  . HERNIA REPAIR      Prior to Admission medications   Medication Sig Start Date End Date Taking? Authorizing Provider  aspirin 81 MG tablet Take 81 mg by mouth daily.    [provider]  atorvastatin (LIPITOR) 40 MG tablet Take 40 mg by mouth daily.    [provider]  diazepam (VALIUM) 2 MG tablet Take 1 tablet (2 mg total) by mouth every 8 (eight) hours as needed for muscle spasms. 04/11/15   Johnn Hai, PA-C  etodolac (LODINE) 400 MG tablet Take 1 tablet (400 mg total) by mouth 2 (two) times daily. 04/11/15   Johnn Hai, PA-C  insulin glargine (LANTUS) 100 UNIT/ML injection Inject 50 Units into the skin 2 (two) times daily.    [provider]   insulin lispro (HUMALOG) 100 UNIT/ML injection Inject 30 Units into the skin 3 (three) times daily with meals.    [provider]  lisinopril (PRINIVIL,ZESTRIL) 10 MG tablet Take 10 mg by mouth daily.    [provider]  tamsulosin (FLOMAX) 0.4 MG CAPS capsule Take 0.4 mg by mouth.    [provider]    No Known Allergies  No family history on file.  Social History Social History   Tobacco Use  . Smoking status: Former Smoker    Types: Cigarettes  . Smokeless tobacco: Never Used  Substance Use Topics  . Alcohol use: Yes    Comment: infreqently  . Drug use: No    Review of Systems  Constitutional: Negative for fever. Eyes: Negative for visual changes. ENT: Negative for sore throat. Cardiovascular: Slight chest pains with coughing on the left side, currently gone. Respiratory: Positive for nonproductive cough. Gastrointestinal: Negative for abdominal pain, vomiting and diarrhea. Genitourinary: Negative for dysuria. Musculoskeletal: Negative for back pain. Skin: Negative for rash. Neurological: Negative for headache.  ____________________________________________   PHYSICAL EXAM:  VITAL SIGNS: ED Triage Vitals  Enc Vitals Group     BP 03/05/17 0856 (!) 143/73     Pulse Rate 03/05/17 0856 97     Resp 03/05/17 0856 18     Temp 03/05/17 0856 98.5 F (36.9 C)     Temp Source 03/05/17 0856 Oral  SpO2 03/05/17 0856 97 %     Weight 03/05/17 0857 271 lb (122.9 kg)     Height 03/05/17 0857 5\' 6"  (1.676 m)     Head Circumference --      Peak Flow --      Pain Score 03/05/17 0856 6     Pain Loc --      Pain Edu? --      Excl. in [place]? --      Constitutional: Alert and oriented. Well appearing and in no distress. HEENT   Head: Normocephalic and atraumatic.      Eyes: Conjunctivae are normal. Pupils equal and round.       Ears:         Nose: No congestion/rhinnorhea.   Mouth/Throat: Mucous membranes are moist.   Neck: No  stridor. Cardiovascular/Chest: Normal rate, regular rhythm.  No murmurs, rubs, or gallops. Respiratory: Normal respiratory effort without tachypnea nor retractions. Breath sounds are clear and equal bilaterally. No wheezes/rales/rhonchi. Gastrointestinal: Soft. No distention, no guarding, no rebound. Nontender.  Obese Genitourinary/rectal:Deferred Musculoskeletal: Nontender with normal range of motion in all extremities. No joint effusions.  Some tenderness behind the knee on the right side, trace edema bilateral lower extremities. Neurologic:  Normal speech and language. No gross or focal neurologic deficits are appreciated. Skin:  Skin is warm, dry and intact. No rash noted. Psychiatric: Mood and affect are normal. Speech and behavior are normal. Patient exhibits appropriate insight and judgment.   ____________________________________________  LABS (pertinent positives/negatives) I, Lisa Roca, MD the attending physician have reviewed the labs noted below.  Labs Reviewed  BASIC METABOLIC PANEL - Abnormal; Notable for the following components:      Result Value   Glucose, Bld 148 (*)    All other components within normal limits  CBC - Abnormal; Notable for the following components:   Hemoglobin 12.7 (*)    HCT 38.6 (*)    RDW 15.3 (*)    All other components within normal limits  GLUCOSE, CAPILLARY - Abnormal; Notable for the following components:   Glucose-Capillary 134 (*)    All other components within normal limits  TROPONIN I  TROPONIN I    ____________________________________________    EKG I, Lisa Roca, MD, the attending physician have personally viewed and interpreted all ECGs.  94 bpm.  Normal sinus rhythm.  Nonspecific intraventricular conduction delay.  Normal ST and T wave ____________________________________________  RADIOLOGY All Xrays were viewed by me.  Imaging interpreted by Radiologist, and I, Lisa Roca, MD the attending physician have reviewed  the radiologist interpretation noted below.  Chest x-ray two-view:  IMPRESSION: Normal exam.  [place] rle:  IMPRESSION: No evidence of deep venous thrombosis. __________________________________________  PROCEDURES  Procedure(s) performed: None  Critical Care performed: None   ____________________________________________  ED COURSE / ASSESSMENT AND PLAN  Pertinent labs & imaging results that were available during my care of the patient were reviewed by me and considered in my medical decision making (see chart for details).    Patient seems to have bronchitis clinically, very mild end expiratory wheeze with forced exhalation, but then does seem to go into bronchospastic coughing fits at times where he complains of the pain to his left side chest wall.  I am most suspicious that the chest discomfort over the last 24 hours is probably musculoskeletal.  His EKG is reassuring.  His initial troponin is reassuring.  Chest discomfort with cough has been there since yesterday, I do not think he needs repeat  troponin today.  I am to go ahead and recommend that he follow-up with a cardiologist since he has not been seen or had a stress test for several years.  He does have some risk factors including hypertension, hyperlipidemia, and obesity.  Is complaining a little bit of right leg pain especially behind the knee, although clinically I am not highly suspicious for PE, I am going to go ahead and ultrasound that right lower extremity.  Symptomatically for his cough, I am going to try Tessalon as well as DuoNeb.  Regarding his blood pressure "spikes" the patient was most concerned about, we discussed that it was during coughing episode, and currently blood pressure is better with a systolic around 768.  Ultrasound negative for DVT.  Patient feels better after duoneb.    DIFFERENTIAL DIAGNOSIS: Differential diagnosis includes, but is not limited to, ACS, aortic dissection, pulmonary  embolism, cardiac tamponade, pneumothorax, pneumonia, pericarditis, myocarditis, GI-related causes including esophagitis/gastritis, and musculoskeletal chest wall pain.    Differential includes, but is not limited to, viral syndrome, bronchitis including COPD exacerbation, pneumonia, reactive airway disease including asthma, CHF including exacerbation with or without pulmonary/interstitial edema, pneumothorax, ACS, thoracic trauma, and pulmonary embolism.  CONSULTATIONS:   None  Patient / Family / Caregiver informed of clinical course, medical decision-making process, and agree with plan.   I discussed return precautions, follow-up instructions, and discharge instructions with patient and/or family.  Discharge Instructions : You are evaluated for cough and chest pain with cough, and as we discussed, your exam and evaluation are overall reassuring in the emergency department today.  I am most suspicious of pains being related to the cough, which seems to be due to bronchitis.  I am recommending that you follow-up with a cardiologist to discuss whether or not you should have additional workup including possible stress test.  Return to the emergency department immediately for any worsening symptoms including chest pain, nausea, sweats, dizziness or passing out, palpitations, trouble breathing, or any other symptoms concerning to you.  Please also follow-up with your primary care doctor this week.    ___________________________________________   FINAL CLINICAL IMPRESSION(S) / ED DIAGNOSES   Final diagnoses:  Bronchitis  Atypical chest pain  Chest wall pain      ___________________________________________        Note: This dictation was prepared with Dragon dictation. Any transcriptional errors that result from this process are unintentional    Lisa Roca, MD 03/05/17 1126

## 2017-05-10 NOTE — Progress Notes (Signed)
05/11/2017 8:17 PM   [place] [birth year] [id number]  Referring provider: [facility], [address] [place], [place] [address]  No chief complaint on file.   HPI: Patient is a 67 year old diabetic African American male who was referred by Dr. Ellin Goodie for nocturia not improving with medications.  He states he started to experience nocturia x6-8 about 3 months ago.  He has been on tamsulosin and finasteride during that time without improvement of his symptoms.    He is also complaining of frequency, urgency, intermittency, straining to urinate and a weak urinary stream.  Patient denies any gross hematuria, dysuria or suprapubic/flank pain.  Patient denies any fevers, chills, nausea or vomiting.  His PVR was 0 mL.  Nothing seems to make the nocturia worse and nothing seems to make the nocturia better.  He did admit that he is not sleeping with his BiPAP machine on a nightly basis.   Reviewed referral notes.   Started on finasteride 5 mg daily three months ago On tamsulosin 0.4 mg daily In 2013 was on Veiscare 5 mg - which he stated helped Has sleep apnea - has Bi-pap Hbg A1c was 7.2% in 04/2017  PMH: Past Medical History:  Diagnosis Date  . Diabetes mellitus without complication ([place])   . Hypercholesteremia   . Hypertension     Surgical History: Past Surgical History:  Procedure Laterality Date  . HERNIA REPAIR      Home Medications:  Allergies as of 05/11/2017   No Known Allergies     Medication List        Accurate as of 05/11/17 11:59 PM. Always use your most recent med list.          albuterol 108 (90 Base) MCG/ACT inhaler Commonly known as:  PROVENTIL HFA;VENTOLIN HFA Inhale 2 puffs into the lungs every 6 (six) hours as needed for wheezing or shortness of breath.   aspirin 81 MG tablet Take 81 mg by mouth daily.   atorvastatin 40 MG tablet Commonly known as:  LIPITOR Take 40 mg by mouth daily.     insulin glargine 100 UNIT/ML injection Commonly known as:  LANTUS Inject 50 Units into the skin 2 (two) times daily.   insulin lispro 100 UNIT/ML injection Commonly known as:  HUMALOG Inject 30 Units into the skin 3 (three) times daily with meals.   lisinopril 10 MG tablet Commonly known as:  PRINIVIL,ZESTRIL Take 10 mg by mouth daily.       Allergies: No Known Allergies  Family History: Family History  Problem Relation Age of Onset  . Prostate cancer Neg Hx   . Bladder Cancer Neg Hx   . Kidney cancer Neg Hx     Social History:  reports that he has quit smoking. His smoking use included cigarettes. He has never used smokeless tobacco. He reports that he drinks alcohol. He reports that he does not use drugs.  ROS: UROLOGY Frequent Urination?: Yes Hard to postpone urination?: Yes Burning/pain with urination?: No Get up at night to urinate?: Yes Leakage of urine?: No Urine stream starts and stops?: Yes Trouble starting stream?: No Do you have to strain to urinate?: Yes Blood in urine?: No Urinary tract infection?: No Sexually transmitted disease?: No Injury to kidneys or bladder?: No Painful intercourse?: No Weak stream?: Yes Erection problems?: No Penile pain?: No  Gastrointestinal Nausea?: No Vomiting?: No Indigestion/heartburn?: No Diarrhea?: No Constipation?: No  Constitutional Fever: No Night sweats?: No Weight loss?:  No Fatigue?: No  Skin Skin rash/lesions?: No Itching?: No  Eyes Blurred vision?: No Double vision?: No  Ears/Nose/Throat Sore throat?: No Sinus problems?: Yes  Hematologic/Lymphatic Swollen glands?: No Easy bruising?: No  Cardiovascular Leg swelling?: Yes Chest pain?: No  Respiratory Cough?: No Shortness of breath?: No  Endocrine Excessive thirst?: No  Musculoskeletal Back pain?: Yes Joint pain?: Yes  Neurological Headaches?: No Dizziness?: No  Psychologic Depression?: No Anxiety?: No  Physical  Exam: BP (!) 150/81   Pulse 94   Resp 16   Ht 5\' 8"  (1.727 m)   Wt 278 lb 4.8 oz (126.2 kg)   SpO2 96%   BMI 42.32 kg/m   Constitutional: Well nourished. Alert and oriented, No acute distress. HEENT: [place] AT, moist mucus membranes. Trachea midline, no masses. Cardiovascular: No clubbing, cyanosis, or edema. Respiratory: Normal respiratory effort, no increased work of breathing. GI: Abdomen is soft, non tender, non distended, no abdominal masses. Liver and spleen not palpable.  No hernias appreciated.  Stool sample for occult testing is not indicated.   GU: No CVA tenderness.  No bladder fullness or masses.  Patient with uncircumcised phallus.   Foreskin easily retracted   Urethral meatus is patent.  No penile discharge. No penile lesions or rashes. Scrotum without lesions, cysts, rashes and/or edema.  Testicles are located scrotally bilaterally. No masses are appreciated in the testicles. Left and right epididymis are normal. Rectal: Patient with  normal sphincter tone. Anus and perineum without scarring or rashes. No rectal masses are appreciated. Prostate is approximately 55 grams, no nodules are appreciated. Seminal vesicles are normal. Skin: No rashes, bruises or suspicious lesions. Lymph: No cervical or inguinal adenopathy. Neurologic: Grossly intact, no focal deficits, moving all 4 extremities. Psychiatric: Normal mood and affect.  Laboratory Data: Lab Results  Component Value Date   WBC 5.9 03/05/2017   HGB 12.7 (L) 03/05/2017   HCT 38.6 (L) 03/05/2017   MCV 85.0 03/05/2017   PLT 278 03/05/2017    Lab Results  Component Value Date   CREATININE 0.75 03/05/2017    No results found for: PSA  No results found for: TESTOSTERONE  No results found for: HGBA1C  No results found for: TSH  No results found for: CHOL, HDL, CHOLHDL, VLDL, LDLCALC  No results found for: AST No results found for: ALT No components found for: ALKALINEPHOPHATASE No components found for:  BILIRUBINTOTAL  No results found for: ESTRADIOL  Urinalysis    Component Value Date/Time   COLORURINE YELLOW (A) 04/11/2015 0433   APPEARANCEUR CLEAR (A) 04/11/2015 0433   APPEARANCEUR Clear 01/29/2013 1330   LABSPEC 1.016 04/11/2015 0433   LABSPEC 1.016 01/29/2013 1330   PHURINE 5.0 04/11/2015 0433   GLUCOSEU 50 (A) 04/11/2015 0433   GLUCOSEU 50 mg/dL 01/29/2013 1330   HGBUR NEGATIVE 04/11/2015 [place] 04/11/2015 0433   BILIRUBINUR Negative 01/29/2013 1330   KETONESUR NEGATIVE 04/11/2015 0433   PROTEINUR NEGATIVE 04/11/2015 0433   NITRITE NEGATIVE 04/11/2015 0433   LEUKOCYTESUR NEGATIVE 04/11/2015 0433   LEUKOCYTESUR Negative 01/29/2013 1330    I have reviewed the labs.   Pertinent Imaging: Results for [place], JONHATAN HEARTY (MRN [id number]) as of 06/04/2017 20:27  Ref. Range 05/11/2017 11:24  Scan Result Unknown 19ml   I have independently reviewed the films.    Assessment & Plan:    1. Nocturia I explained to the patient that nocturia is often multi-factorial and difficult to treat.  Sleeping disorders, heart conditions, peripheral vascular disease, diabetes,  an enlarged prostate for men, an urethral stricture causing bladder outlet obstruction and/or certain medications can contribute to nocturia. I have suggested that the patient avoid caffeine after noon and alcohol in the evening.  He or she may also benefit from fluid restrictions after 6:00 in the evening and voiding just prior to bedtime. I have explained that research studies have showed that over 84% of patients with sleep apnea reported frequent nighttime urination.   With sleep apnea, oxygen decreases, carbon dioxide increases, the blood become more acidic, the heart rate drops and blood vessels in the lung constrict.  The body is then alerted that something is very wrong. The sleeper must wake enough to reopen the airway. By this time, the heart is racing and experiences a false signal of fluid  overload. The heart excretes a hormone-like protein that tells the body to get rid of sodium and water, resulting in nocturia.  I also informed the patient that a recent study noted that decreasing sodium intake to 2.3 grams daily, if they don't have issues with hyponatremia, can also reduce the number of nightly voids Encouraged patient to sleep with Bi-pap consistently for the next 30 days and RTC for symptom recheck  2. BPH with LUTS  - Continue conservative management, avoiding bladder irritants and timed voiding's  - most bothersome symptoms is/are nocturia  - Continue tamsulosin 0.4 mg daily and finasteride 5 mg daily  - RTC in one month for IPSS and PVR  Return in about 1 month (around 06/11/2017) for IPSS and PVR.  These notes generated with voice recognition software. I apologize for typographical errors.  Zara Council, [facility] [address] [place], [place] [address] [phone]

## 2017-05-11 ENCOUNTER — Encounter: Payer: Self-pay | Admitting: Urology

## 2017-05-11 ENCOUNTER — Ambulatory Visit (INDEPENDENT_AMBULATORY_CARE_PROVIDER_SITE_OTHER): Payer: Medicare Other | Admitting: Urology

## 2017-05-11 VITALS — BP 150/81 | HR 94 | Resp 16 | Ht 68.0 in | Wt 278.3 lb

## 2017-05-11 DIAGNOSIS — R351 Nocturia: Secondary | ICD-10-CM | POA: Diagnosis not present

## 2017-05-11 DIAGNOSIS — N138 Other obstructive and reflux uropathy: Secondary | ICD-10-CM

## 2017-05-11 DIAGNOSIS — N401 Enlarged prostate with lower urinary tract symptoms: Secondary | ICD-10-CM | POA: Diagnosis not present

## 2017-05-11 LAB — BLADDER SCAN AMB NON-IMAGING

## 2017-06-04 ENCOUNTER — Telehealth: Payer: Self-pay | Admitting: Urology

## 2017-06-04 NOTE — Telephone Encounter (Signed)
I need patient's PSA's from Dr. Ellin Goodie office.

## 2017-06-05 NOTE — Telephone Encounter (Signed)
Records requested They said if they have any they will fax them over, if not they will call and let me know.  Sharyn Lull

## 2017-06-08 ENCOUNTER — Other Ambulatory Visit: Payer: Self-pay

## 2017-06-12 ENCOUNTER — Ambulatory Visit: Payer: Medicare Other | Admitting: Urology

## 2017-06-20 ENCOUNTER — Ambulatory Visit: Payer: Medicare Other | Admitting: Urology

## 2017-07-10 NOTE — Progress Notes (Signed)
07/12/2017 11:13 AM   [name] 11-Feb-1951 [id number]  Referring provider: [facility], [place], [place] [address]  Chief Complaint  Patient presents with  . Nocturia    HPI: 67 yo AAM with DM, nocturia and BPH with LU TS who presents today for follow up.  Background history Patient is a 67 year old diabetic African American male who was referred by Dr. Ellin Goodie for nocturia not improving with medications.  He states he started to experience nocturia x6-8 about 3 months ago.  He has been on tamsulosin and finasteride during that time without improvement of his symptoms.  He is also complaining of frequency, urgency, intermittency, straining to urinate and a weak urinary stream.  Patient denies any gross hematuria, dysuria or suprapubic/flank pain.  Patient denies any fevers, chills, nausea or vomiting.  His PVR was 0 mL.  Nothing seems to make the nocturia worse and nothing seems to make the nocturia better.  He did admit that he is not sleeping with his BiPAP machine on a nightly basis.  At his visit on 05/11/2017,  he strongly encouraged to sleep with his BiPAP every night for the next 30 days and to return for evaluation.  Today, he has been sleeping with his BiPAP machine nightly and restricting fluids after 6 pm.  His nocturia has been reduced by half.    BPH WITH LUTS  (prostate and/or bladder) IPSS score: 10/2  PVR: 18 mL    Major complaint(s): nocturia x 4 x a few months. Denies any dysuria, hematuria or suprapubic pain.   Currently taking: Tamsulosin 0.4 mg daily and finasteride 5 mg daily  Denies any recent fevers, chills, nausea or vomiting.  He does not have a family history of PCa.  IPSS    Row Name 07/12/17 1000         International Prostate Symptom Score   How often have you had the sensation of not emptying your bladder?  About half the time     How often have you had to urinate less than  every two hours?  Less than 1 in 5 times     How often have you found you stopped and started again several times when you urinated?  Less than half the time     How often have you found it difficult to postpone urination?  Not at All     How often have you had a weak urinary stream?  Not at All     How often have you had to strain to start urination?  Not at All     How many times did you typically get up at night to urinate?  4 Times     Total IPSS Score  10       Quality of Life due to urinary symptoms   If you were to spend the rest of your life with your urinary condition just the way it is now how would you feel about that?  Mostly Satisfied        Score:  1-7 Mild 8-19 Moderate 20-35 Severe   Reviewed referral notes.   Started on finasteride 5 mg daily three months ago On tamsulosin 0.4 mg daily In 2013 was on Veiscare 5 mg - which he stated helped Has sleep apnea - has Bi-pap Hbg A1c was 7.2% in 04/2017  PMH: Past Medical History:  Diagnosis Date  . Diabetes mellitus without complication ([place])   . Hypercholesteremia   .  Hypertension     Surgical History: Past Surgical History:  Procedure Laterality Date  . HERNIA REPAIR      Home Medications:  Allergies as of 07/12/2017   No Known Allergies     Medication List        Accurate as of 07/12/17 11:13 AM. Always use your most recent med list.          albuterol 108 (90 Base) MCG/ACT inhaler Commonly known as:  PROVENTIL HFA;VENTOLIN HFA Inhale 2 puffs into the lungs every 6 (six) hours as needed for wheezing or shortness of breath.   aspirin 81 MG tablet Take 81 mg by mouth daily.   atorvastatin 40 MG tablet Commonly known as:  LIPITOR Take 40 mg by mouth daily.   fluticasone 50 MCG/ACT nasal spray Commonly known as:  FLONASE USE 2 SPRAYS INTO EACH NOSTRIL EVERY DAY   insulin glargine 100 UNIT/ML injection Commonly known as:  LANTUS Inject 50 Units into the skin 2 (two) times daily.   insulin  lispro 100 UNIT/ML injection Commonly known as:  HUMALOG Inject 30 Units into the skin 3 (three) times daily with meals.   lisinopril 10 MG tablet Commonly known as:  PRINIVIL,ZESTRIL Take 10 mg by mouth daily.   SUPER B COMPLEX/C PO TAKE 1 CAPSULE BY MOUTH TWICE A DAY TO HELP WITH CRAMPS       Allergies: No Known Allergies  Family History: Family History  Problem Relation Age of Onset  . Prostate cancer Neg Hx   . Bladder Cancer Neg Hx   . Kidney cancer Neg Hx     Social History:  reports that he has quit smoking. His smoking use included cigarettes. He has never used smokeless tobacco. He reports that he drinks alcohol. He reports that he does not use drugs.  ROS: UROLOGY Frequent Urination?: No Hard to postpone urination?: No Burning/pain with urination?: No Get up at night to urinate?: No Leakage of urine?: No Urine stream starts and stops?: No Trouble starting stream?: No Do you have to strain to urinate?: No Blood in urine?: No Urinary tract infection?: No Sexually transmitted disease?: No Injury to kidneys or bladder?: No Painful intercourse?: No Weak stream?: No Erection problems?: No Penile pain?: No  Gastrointestinal Nausea?: No Vomiting?: No Indigestion/heartburn?: No Diarrhea?: No Constipation?: No  Constitutional Fever: No Night sweats?: No Weight loss?: No Fatigue?: No  Skin Skin rash/lesions?: No Itching?: No  Eyes Blurred vision?: No Double vision?: No  Ears/Nose/Throat Sore throat?: No Sinus problems?: No  Hematologic/Lymphatic Swollen glands?: No Easy bruising?: No  Cardiovascular Leg swelling?: No Chest pain?: No  Respiratory Cough?: No Shortness of breath?: No  Endocrine Excessive thirst?: No  Musculoskeletal Back pain?: No Joint pain?: No  Neurological Headaches?: No Dizziness?: No  Psychologic Depression?: No Anxiety?: No  Physical Exam: BP (!) 152/80 (BP Location: Right Arm, Patient Position:  Sitting, Cuff Size: Large)   Pulse 92   Ht 5\' 8"  (1.727 m)   Wt 275 lb (124.7 kg)   SpO2 99%   BMI 41.81 kg/m   Constitutional: Well nourished. Alert and oriented, No acute distress. HEENT: [place] AT, moist mucus membranes. Trachea midline, no masses. Cardiovascular: No clubbing, cyanosis, or edema. Respiratory: Normal respiratory effort, no increased work of breathing. Skin: No rashes, bruises or suspicious lesions. Lymph: No cervical or inguinal adenopathy. Neurologic: Grossly intact, no focal deficits, moving all 4 extremities. Psychiatric: Normal mood and affect.  Laboratory Data: Lab Results  Component Value Date  WBC 5.9 03/05/2017   HGB 12.7 (L) 03/05/2017   HCT 38.6 (L) 03/05/2017   MCV 85.0 03/05/2017   PLT 278 03/05/2017    Lab Results  Component Value Date   CREATININE 0.75 03/05/2017    No results found for: PSA  No results found for: TESTOSTERONE  No results found for: HGBA1C  No results found for: TSH  No results found for: CHOL, HDL, CHOLHDL, VLDL, LDLCALC  No results found for: AST No results found for: ALT No components found for: ALKALINEPHOPHATASE No components found for: BILIRUBINTOTAL  No results found for: ESTRADIOL  Urinalysis    Component Value Date/Time   COLORURINE YELLOW (A) 04/11/2015 0433   APPEARANCEUR CLEAR (A) 04/11/2015 0433   APPEARANCEUR Clear 01/29/2013 1330   LABSPEC 1.016 04/11/2015 0433   LABSPEC 1.016 01/29/2013 1330   PHURINE 5.0 04/11/2015 0433   GLUCOSEU 50 (A) 04/11/2015 0433   GLUCOSEU 50 mg/dL 01/29/2013 1330   HGBUR NEGATIVE 04/11/2015 [place] 04/11/2015 0433   BILIRUBINUR Negative 01/29/2013 1330   KETONESUR NEGATIVE 04/11/2015 0433   PROTEINUR NEGATIVE 04/11/2015 0433   NITRITE NEGATIVE 04/11/2015 0433   LEUKOCYTESUR NEGATIVE 04/11/2015 0433   LEUKOCYTESUR Negative 01/29/2013 1330    I have reviewed the labs.   Pertinent Imaging: Results for Mollenkopf, DEJA KAIGLER (MRN [id number]) as  of 07/12/2017 11:02  Ref. Range 07/12/2017 11:00  Scan Result Unknown 24mL    I have independently reviewed the films.    Assessment & Plan:    1. Nocturia Sleeping with BiPAP machine nightly - still getting up x 3 Start Mybetriq 25 mg daily #28 samples given RTC in 3 weeks for I PSS and PVR   2. BPH with LU TS I PSS score today is 10/2 Continue conservative management, avoiding bladder irritants and timed voiding's Most bothersome symptoms is/are nocturia Continue tamsulosin 0.4 mg daily and finasteride 5 mg daily PSA    Return in about 3 weeks (around 08/02/2017) for IPSS and PVR.  These notes generated with voice recognition software. I apologize for typographical errors.  Zara Council, PA-C  [facility] [address] [place], [place] [address] [phone]

## 2017-07-12 ENCOUNTER — Ambulatory Visit (INDEPENDENT_AMBULATORY_CARE_PROVIDER_SITE_OTHER): Payer: Medicare Other | Admitting: Urology

## 2017-07-12 ENCOUNTER — Other Ambulatory Visit: Payer: Self-pay

## 2017-07-12 ENCOUNTER — Encounter: Payer: Self-pay | Admitting: Urology

## 2017-07-12 VITALS — BP 152/80 | HR 92 | Ht 68.0 in | Wt 275.0 lb

## 2017-07-12 DIAGNOSIS — N138 Other obstructive and reflux uropathy: Secondary | ICD-10-CM | POA: Diagnosis not present

## 2017-07-12 DIAGNOSIS — N401 Enlarged prostate with lower urinary tract symptoms: Secondary | ICD-10-CM

## 2017-07-12 DIAGNOSIS — R351 Nocturia: Secondary | ICD-10-CM | POA: Diagnosis not present

## 2017-07-12 LAB — BLADDER SCAN AMB NON-IMAGING

## 2017-07-12 NOTE — Patient Instructions (Signed)
Mirabegron extended-release tablets What is this medicine? MIRABEGRON (MIR a BEG ron) is used to treat overactive bladder. This medicine reduces the amount of bathroom visits. It may also help to control wetting accidents. This medicine may be used for other purposes; ask your health care provider or pharmacist if you have questions. COMMON BRAND NAME(S): Myrbetriq What should I tell my health care provider before I take this medicine? They need to know if you have any of these conditions: -difficulty passing urine -high blood pressure -kidney disease -liver disease -an unusual or allergic reaction to mirabegron, other medicines, foods, dyes, or preservatives -pregnant or trying to get pregnant -breast-feeding How should I use this medicine? Take this medicine by mouth with a glass of water. Follow the directions on the prescription label. Do not cut, crush or chew this medicine. You can take it with or without food. If it upsets your stomach, take it with food. Take your medicine at regular intervals. Do not take it more often than directed. Do not stop taking except on your doctor's advice. Talk to your pediatrician regarding the use of this medicine in children. Special care may be needed. Overdosage: If you think you have taken too much of this medicine contact a poison control center or emergency room at once. NOTE: This medicine is only for you. Do not share this medicine with others. What if I miss a dose? If you miss a dose, take it as soon as you can. If it is almost time for your next dose, take only that dose. Do not take double or extra doses. What may interact with this medicine? -certain medicines for bladder problems like fesoterodine, oxybutynin, solifenacin, tolterodine -desipramine -digoxin -flecainide -ketoconazole -MAOIs like Carbex, Eldepryl, Marplan, Nardil, and Parnate -metoprolol -propafenone -thioridazine -warfarin This list may not describe all possible  interactions. Give your health care provider a list of all the medicines, herbs, non-prescription drugs, or dietary supplements you use. Also tell them if you smoke, drink alcohol, or use illegal drugs. Some items may interact with your medicine. What should I watch for while using this medicine? It may take 8 weeks to notice the full benefit from this medicine. You may need to limit your intake tea, coffee, caffeinated sodas, and alcohol. These drinks may make your symptoms worse. Visit your doctor or health care professional for regular checks on your progress. Check your blood pressure as directed. Ask your doctor or health care professional what your blood pressure should be and when you should contact him or her. What side effects may I notice from receiving this medicine? Side effects that you should report to your doctor or health care professional as soon as possible: -allergic reactions like skin rash, itching or hives, swelling of the face, lips, or tongue -chest pain or palpitations -severe or sudden headache -high blood pressure -fast, irregular heartbeat -redness, blistering, peeling or loosening of the skin, including inside the mouth -signs of infection like fever or chills; cough; sore throat; pain or difficulty passing urine -trouble passing urine or change in the amount of urine Side effects that usually do not require medical attention (report to your doctor or health care professional if they continue or are bothersome): -constipation -diarrhea -dizziness -dry eyes -joint pain -mild headache -nausea -runny nose This list may not describe all possible side effects. Call your doctor for medical advice about side effects. You may report side effects to FDA at 1-800-FDA-1088. Where should I keep my medicine? Keep out of the reach   of children. Store at room temperature between 15 and 30 degrees C (59 and 86 degrees F). Throw away any unused medicine after the expiration  date. NOTE: This sheet is a summary. It may not cover all possible information. If you have questions about this medicine, talk to your doctor, pharmacist, or health care provider.  2018 Elsevier/Gold Standard (2015-03-19 12:14:30)  

## 2017-07-13 ENCOUNTER — Telehealth: Payer: Self-pay

## 2017-07-13 LAB — PSA: Prostate Specific Ag, Serum: 1 ng/mL (ref 0.0–4.0)

## 2017-07-13 NOTE — Telephone Encounter (Signed)
-----   Message from Nori Riis, PA-C sent at 07/13/2017  8:11 AM EDT ----- Please let Randy [name] know that his PSA is normal at 1.0.

## 2017-07-13 NOTE — Telephone Encounter (Signed)
Pt informed

## 2017-08-04 NOTE — Progress Notes (Signed)
08/07/2017 10:07 AM   Randy [name] Mar 05-12-1950 [id number]  Referring provider: [facility], [place], [place] [address]  Chief Complaint  Patient presents with  . Nocturia  . Benign Prostatic Hypertrophy    HPI: 67 yo AAM with DM, nocturia and BPH with LU TS who presents today for follow up.  Background history Patient is a 67 year old diabetic African American male who was referred by Dr. Ellin Goodie for nocturia not improving with medications.  He states he started to experience nocturia x6-8 about 3 months ago.  He has been on tamsulosin and finasteride during that time without improvement of his symptoms.  He is also complaining of frequency, urgency, intermittency, straining to urinate and a weak urinary stream.  Patient denies any gross hematuria, dysuria or suprapubic/flank pain.  Patient denies any fevers, chills, nausea or vomiting.  His PVR was 0 mL.  Nothing seems to make the nocturia worse and nothing seems to make the nocturia better.  He did admit that he is not sleeping with his BiPAP machine on a nightly basis.  At his visit on 05/11/2017,  he strongly encouraged to sleep with his BiPAP every night for the next 30 days and to return for evaluation.  At his visit on 07/13/2017, he had been sleeping with his BiPAP machine nightly and restricting fluids after 6 pm.  His nocturia has been reduced by half.    BPH WITH LUTS  (prostate and/or bladder) I PSS 10/2    PVR  17 mL   Previous IPSS score: 10/2  Previous PVR: 18 mL    Major complaint(s): nocturia x 2 for the last three weeks.  Denies any dysuria, hematuria or suprapubic pain.   Currently taking: Myrbetriq 25 mg daily.  His BP is 151/84.    Denies any recent fevers, chills, nausea or vomiting.  He does not have a family history of PCa.  IPSS    Row Name 07/12/17 1000 08/07/17 0900       International Prostate Symptom Score   How often have you had the  sensation of not emptying your bladder?  About half the time  About half the time    How often have you had to urinate less than every two hours?  Less than 1 in 5 times  Less than half the time    How often have you found you stopped and started again several times when you urinated?  Less than half the time  Not at All    How often have you found it difficult to postpone urination?  Not at All  Not at All    How often have you had a weak urinary stream?  Not at All  Not at All    How often have you had to strain to start urination?  Not at All  Not at All    How many times did you typically get up at night to urinate?  4 Times  2 Times    Total IPSS Score  10  7      Quality of Life due to urinary symptoms   If you were to spend the rest of your life with your urinary condition just the way it is now how would you feel about that?  Mostly Satisfied  Mostly Satisfied       Score:  1-7 Mild 8-19 Moderate 20-35 Severe     Started on finasteride 5 mg daily three  months ago On tamsulosin 0.4 mg daily In 2013 was on Veiscare 5 mg - which he stated helped Has sleep apnea - has Bi-pap Hbg A1c was 7.2% in 04/2017  PMH: Past Medical History:  Diagnosis Date  . Diabetes mellitus without complication ([place])   . Hypercholesteremia   . Hypertension     Surgical History: Past Surgical History:  Procedure Laterality Date  . HERNIA REPAIR      Home Medications:  Allergies as of 08/07/2017   No Known Allergies     Medication List        Accurate as of 08/07/17 10:07 AM. Always use your most recent med list.          albuterol 108 (90 Base) MCG/ACT inhaler Commonly known as:  PROVENTIL HFA;VENTOLIN HFA Inhale 2 puffs into the lungs every 6 (six) hours as needed for wheezing or shortness of breath.   aspirin 81 MG tablet Take 81 mg by mouth daily.   atorvastatin 40 MG tablet Commonly known as:  LIPITOR Take 40 mg by mouth daily.   fluticasone 50 MCG/ACT nasal  spray Commonly known as:  FLONASE USE 2 SPRAYS INTO EACH NOSTRIL EVERY DAY   insulin glargine 100 UNIT/ML injection Commonly known as:  LANTUS Inject 50 Units into the skin 2 (two) times daily.   insulin lispro 100 UNIT/ML injection Commonly known as:  HUMALOG Inject 30 Units into the skin 3 (three) times daily with meals.   lisinopril 10 MG tablet Commonly known as:  PRINIVIL,ZESTRIL Take 10 mg by mouth daily.   mirabegron ER 25 MG Tb24 tablet Commonly known as:  MYRBETRIQ Take 1 tablet (25 mg total) by mouth daily.   SUPER B COMPLEX/C PO TAKE 1 CAPSULE BY MOUTH TWICE A DAY TO HELP WITH CRAMPS       Allergies: No Known Allergies  Family History: Family History  Problem Relation Age of Onset  . Prostate cancer Neg Hx   . Bladder Cancer Neg Hx   . Kidney cancer Neg Hx     Social History:  reports that he has quit smoking. His smoking use included cigarettes. He has never used smokeless tobacco. He reports that he drinks alcohol. He reports that he does not use drugs.  ROS: UROLOGY Frequent Urination?: No Hard to postpone urination?: No Burning/pain with urination?: No Get up at night to urinate?: No Leakage of urine?: No Urine stream starts and stops?: No Trouble starting stream?: No Do you have to strain to urinate?: No Blood in urine?: No Urinary tract infection?: No Sexually transmitted disease?: No Injury to kidneys or bladder?: No Painful intercourse?: No Weak stream?: No Erection problems?: No Penile pain?: No  Gastrointestinal Nausea?: No Vomiting?: No Indigestion/heartburn?: No Diarrhea?: No Constipation?: No  Constitutional Fever: No Night sweats?: No Weight loss?: No Fatigue?: No  Skin Skin rash/lesions?: No Itching?: No  Eyes Blurred vision?: No Double vision?: No  Ears/Nose/Throat Sore throat?: No Sinus problems?: No  Hematologic/Lymphatic Swollen glands?: No Easy bruising?: No  Cardiovascular Leg swelling?: No Chest  pain?: No  Respiratory Cough?: No Shortness of breath?: No  Endocrine Excessive thirst?: No  Musculoskeletal Back pain?: No Joint pain?: No  Neurological Headaches?: No Dizziness?: No  Psychologic Depression?: No Anxiety?: No  Physical Exam: BP (!) 151/84   Pulse 91   Resp 16   Ht 5\' 6"  (1.676 m)   Wt 279 lb 9.6 oz (126.8 kg)   SpO2 96%   BMI 45.13 kg/m   Constitutional: Well  nourished. Alert and oriented, No acute distress. HEENT: [place] AT, moist mucus membranes. Trachea midline, no masses. Cardiovascular: No clubbing, cyanosis, or edema. Respiratory: Normal respiratory effort, no increased work of breathing. Skin: No rashes, bruises or suspicious lesions. Lymph: No cervical or inguinal adenopathy. Neurologic: Grossly intact, no focal deficits, moving all 4 extremities. Psychiatric: Normal mood and affect.  Laboratory Data: PSA   1.0  07/12/2017 Lab Results  Component Value Date   WBC 5.9 03/05/2017   HGB 12.7 (L) 03/05/2017   HCT 38.6 (L) 03/05/2017   MCV 85.0 03/05/2017   PLT 278 03/05/2017    Lab Results  Component Value Date   CREATININE 0.75 03/05/2017    No results found for: PSA  No results found for: TESTOSTERONE  No results found for: HGBA1C  No results found for: TSH  No results found for: CHOL, HDL, CHOLHDL, VLDL, LDLCALC  No results found for: AST No results found for: ALT No components found for: ALKALINEPHOPHATASE No components found for: BILIRUBINTOTAL  No results found for: ESTRADIOL  Urinalysis    Component Value Date/Time   COLORURINE YELLOW (A) 04/11/2015 0433   APPEARANCEUR CLEAR (A) 04/11/2015 0433   APPEARANCEUR Clear 01/29/2013 1330   LABSPEC 1.016 04/11/2015 0433   LABSPEC 1.016 01/29/2013 1330   PHURINE 5.0 04/11/2015 0433   GLUCOSEU 50 (A) 04/11/2015 0433   GLUCOSEU 50 mg/dL 01/29/2013 1330   HGBUR NEGATIVE 04/11/2015 [place] 04/11/2015 0433   BILIRUBINUR Negative 01/29/2013 1330    KETONESUR NEGATIVE 04/11/2015 0433   PROTEINUR NEGATIVE 04/11/2015 0433   NITRITE NEGATIVE 04/11/2015 0433   LEUKOCYTESUR NEGATIVE 04/11/2015 0433   LEUKOCYTESUR Negative 01/29/2013 1330    I have reviewed the labs.   Pertinent Imaging: Results for [place], Randy [name] (MRN [id number]) as of 08/07/2017 10:02  Ref. Range 08/07/2017 09:33  Scan Result Unknown 17     I have independently reviewed the films.    Assessment & Plan:    1. Nocturia Sleeping with BiPAP machine nightly - still getting up x 3 Myrberiq 25 mg daily has reduced nocturia to 1 to 2 times nightly Send prescription for Myrbetriq 25 mg daily to pharmacy - given samples # 28 in case a PA is needed to fill the script RTC in 12 months for I PSS and PVR  2. BPH with LU TS I PSS score today is 7/2, it is improving Continue conservative management, avoiding bladder irritants and timed voiding's Most bothersome symptoms is/are nocturia - improved with BiPAP and Myrbetriq  Continue tamsulosin 0.4 mg daily and finasteride 5 mg daily     Return in about 1 year (around 08/08/2018) for IPSS, PSA and exam.  These notes generated with voice recognition software. I apologize for typographical errors.  Zara Council, PA-C  [facility] [address] [place], [place] [address] [phone]

## 2017-08-07 ENCOUNTER — Ambulatory Visit (INDEPENDENT_AMBULATORY_CARE_PROVIDER_SITE_OTHER): Payer: Medicare Other | Admitting: Urology

## 2017-08-07 ENCOUNTER — Encounter: Payer: Self-pay | Admitting: Urology

## 2017-08-07 VITALS — BP 151/84 | HR 91 | Resp 16 | Ht 66.0 in | Wt 279.6 lb

## 2017-08-07 DIAGNOSIS — N138 Other obstructive and reflux uropathy: Secondary | ICD-10-CM

## 2017-08-07 DIAGNOSIS — N401 Enlarged prostate with lower urinary tract symptoms: Secondary | ICD-10-CM | POA: Diagnosis not present

## 2017-08-07 DIAGNOSIS — R351 Nocturia: Secondary | ICD-10-CM

## 2017-08-07 LAB — BLADDER SCAN AMB NON-IMAGING: Scan Result: 17

## 2017-08-07 MED ORDER — MIRABEGRON ER 25 MG PO TB24: 25.0000 mg | ORAL_TABLET | Freq: Every day | ORAL | 3 refills | Status: DC

## 2018-03-09 ENCOUNTER — Emergency Department: Payer: Medicare Other

## 2018-03-09 ENCOUNTER — Emergency Department
Admission: EM | Admit: 2018-03-09 | Discharge: 2018-03-09 | Disposition: A | Discharge status: Home / Self Care | Payer: Medicare Other | Attending: Emergency Medicine | Admitting: Emergency Medicine

## 2018-03-09 ENCOUNTER — Other Ambulatory Visit: Payer: Self-pay

## 2018-03-09 DIAGNOSIS — I10 Essential (primary) hypertension: Secondary | ICD-10-CM | POA: Diagnosis not present

## 2018-03-09 DIAGNOSIS — R0789 Other chest pain: Secondary | ICD-10-CM

## 2018-03-09 DIAGNOSIS — Z794 Long term (current) use of insulin: Secondary | ICD-10-CM | POA: Insufficient documentation

## 2018-03-09 DIAGNOSIS — Z79899 Other long term (current) drug therapy: Secondary | ICD-10-CM | POA: Insufficient documentation

## 2018-03-09 DIAGNOSIS — Z7982 Long term (current) use of aspirin: Secondary | ICD-10-CM | POA: Insufficient documentation

## 2018-03-09 DIAGNOSIS — E119 Type 2 diabetes mellitus without complications: Secondary | ICD-10-CM | POA: Insufficient documentation

## 2018-03-09 DIAGNOSIS — Z87891 Personal history of nicotine dependence: Secondary | ICD-10-CM | POA: Insufficient documentation

## 2018-03-09 DIAGNOSIS — J40 Bronchitis, not specified as acute or chronic: Secondary | ICD-10-CM | POA: Insufficient documentation

## 2018-03-09 LAB — BASIC METABOLIC PANEL
ANION GAP: 10 (ref 5–15)
BUN: 12 mg/dL (ref 8–23)
CALCIUM: 8.8 mg/dL — AB (ref 8.9–10.3)
CO2: 21 mmol/L — ABNORMAL LOW (ref 22–32)
Chloride: 102 mmol/L (ref 98–111)
Creatinine, Ser: 0.83 mg/dL (ref 0.61–1.24)
GFR calc Af Amer: >60 mL/min (ref >60)
GFR calc non Af Amer: >60 mL/min (ref >60)
Glucose, Bld: 135 mg/dL — ABNORMAL HIGH (ref 70–99)
POTASSIUM: 3.9 mmol/L (ref 3.5–5.1)
SODIUM: 133 mmol/L — AB (ref 135–145)

## 2018-03-09 LAB — CBC
HCT: 39 % (ref 39.0–52.0)
HEMOGLOBIN: 13 g/dL (ref 13.0–17.0)
MCH: 28.4 pg (ref 26.0–34.0)
MCHC: 33.3 g/dL (ref 30.0–36.0)
MCV: 85.2 fL (ref 80.0–100.0)
PLATELETS: 308 10*3/uL (ref 150–400)
RBC: 4.58 MIL/uL (ref 4.22–5.81)
RDW: 14.4 % (ref 11.5–15.5)
WBC: 6.6 10*3/uL (ref 4.0–10.5)
nRBC: 0 % (ref 0.0–0.2)

## 2018-03-09 LAB — TROPONIN I

## 2018-03-09 MED ORDER — ALBUTEROL SULFATE HFA 108 (90 BASE) MCG/ACT IN AERS: 2.0000 | INHALATION_SPRAY | Freq: Four times a day (QID) | RESPIRATORY_TRACT | 0 refills | Status: DC | PRN

## 2018-03-09 NOTE — ED Triage Notes (Signed)
Pt states that so far he feels ok, states that he has never had chest pain before, states that it maybe related to all the people smoking in the house, states that he hasn't smoked in 30 years and really shouldn't be around it, pt reports cough as well

## 2018-03-09 NOTE — ED Notes (Signed)
AAOx3.  Skin warm and dry. NAD.  No SOB/ DOE. 

## 2018-03-09 NOTE — ED Triage Notes (Signed)
Pt in via EMS from home with c/o cp that started 2 days ago and got worse this am. Pt describes pain as sharp and non radiating worsening with palpation on left side  137/80, FSBS 140, 98.4.

## 2018-03-09 NOTE — ED Provider Notes (Signed)
[facility] Emergency Department Provider Note  ____________________________________________  Time seen: Approximately 12:31 PM  I have reviewed the triage vital signs and the nursing notes.   HISTORY  Chief Complaint Chest Pain and Cough    HPI Randy [name] is a 68 y.o. male with a history of diabetes and hypertension who reports that he was in his usual state of health until about 2 days ago when he was exposed to a lot of secondhand smoke at home which caused him to start coughing.  After a lot of forceful coughing he started having pain in the left side of his chest that hurts when he moves and takes a deep breath.  It is intermittent, nonradiating.  Not exertional.  Denies shortness of breath.  Cough is nonproductive.  No fevers or chills or sweats.  Denies leg pain or swelling.  No recent travel trauma hospitalization or surgery.  No history of DVT or PE.      Past Medical History:  Diagnosis Date  . Diabetes mellitus without complication ([place])   . Hypercholesteremia   . Hypertension      There are no active problems to display for this patient.    Past Surgical History:  Procedure Laterality Date  . HERNIA REPAIR       Prior to Admission medications   Medication Sig Start Date End Date Taking? Authorizing Provider  albuterol (PROVENTIL HFA;VENTOLIN HFA) 108 (90 Base) MCG/ACT inhaler Inhale 2 puffs into the lungs every 6 (six) hours as needed for wheezing or shortness of breath. 03/09/18   Carrie Mew, MD  aspirin 81 MG tablet Take 81 mg by mouth daily.    [provider]  atorvastatin (LIPITOR) 40 MG tablet Take 40 mg by mouth daily.    [provider]  fluticasone (FLONASE) 50 MCG/ACT nasal spray USE 2 SPRAYS INTO EACH NOSTRIL EVERY DAY 05/28/17   [provider]  insulin glargine (LANTUS) 100 UNIT/ML injection Inject 50 Units into the skin 2 (two) times daily.    [provider]  insulin lispro  (HUMALOG) 100 UNIT/ML injection Inject 30 Units into the skin 3 (three) times daily with meals.    [provider]  lisinopril (PRINIVIL,ZESTRIL) 10 MG tablet Take 10 mg by mouth daily.    [provider]  mirabegron ER (MYRBETRIQ) 25 MG TB24 tablet Take 1 tablet (25 mg total) by mouth daily. 08/07/17   McGowan, Larene Beach A, PA-C  SUPER B COMPLEX/C PO TAKE 1 CAPSULE BY MOUTH TWICE A DAY TO HELP WITH CRAMPS 06/29/17   [provider]     Allergies Patient has no known allergies.   Family History  Problem Relation Age of Onset  . Prostate cancer Neg Hx   . Bladder Cancer Neg Hx   . Kidney cancer Neg Hx     Social History Social History   Tobacco Use  . Smoking status: Former Smoker    Types: Cigarettes  . Smokeless tobacco: Never Used  Substance Use Topics  . Alcohol use: Yes    Comment: infreqently  . Drug use: No    Review of Systems  Constitutional:   No fever or chills.  ENT:   No sore throat. No rhinorrhea. Cardiovascular: Positive as above chest pain without syncope. Respiratory:   No dyspnea positive nonproductive cough. Gastrointestinal:   Negative for abdominal pain, vomiting and diarrhea.  Musculoskeletal:   Negative for focal pain or swelling All other systems reviewed and are negative except as  documented above in ROS and HPI.  ____________________________________________   PHYSICAL EXAM:  VITAL SIGNS: ED Triage Vitals [03/09/18 0833]  Enc Vitals Group     BP 130/72     Pulse Rate 96     Resp 20     Temp 98.2 F (36.8 C)     Temp Source Oral     SpO2 96 %     Weight 275 lb (124.7 kg)     Height 5\' 8"  (1.727 m)     Head Circumference      Peak Flow      Pain Score 8     Pain Loc      Pain Edu?      Excl. in [place]?     Vital signs reviewed, nursing assessments reviewed.   Constitutional:   Alert and oriented. Non-toxic appearance. Eyes:   Conjunctivae are normal. EOMI. PERRL. ENT      Head:   Normocephalic and  atraumatic.      Nose:   No congestion/rhinnorhea.       Mouth/Throat:   MMM, no pharyngeal erythema. No peritonsillar mass.       Neck:   No meningismus. Full ROM. Hematological/Lymphatic/Immunilogical:   No cervical lymphadenopathy. Cardiovascular:   RRR. Symmetric bilateral radial and DP pulses.  No murmurs. Cap refill less than 2 seconds. Respiratory:   Normal respiratory effort without tachypnea/retractions. Breath sounds are clear and equal bilaterally. No wheezes/rales/rhonchi. Gastrointestinal:   Soft and nontender. Non distended. There is no CVA tenderness.  No rebound, rigidity, or guarding. Musculoskeletal:   Normal range of motion in all extremities. No joint effusions.  No lower extremity tenderness.  No edema.  Anterior chest wall is exquisitely tender to the touch reproducing his pain in the area of the upper pectoralis on the left. Neurologic:   Normal speech and language.  Motor grossly intact. No acute focal neurologic deficits are appreciated.  Skin:    Skin is warm, dry and intact. No rash noted.  No petechiae, purpura, or bullae.  ____________________________________________    LABS (pertinent positives/negatives) (all labs ordered are listed, but only abnormal results are displayed) Labs Reviewed  BASIC METABOLIC PANEL - Abnormal; Notable for the following components:      Result Value   Sodium 133 (*)    CO2 21 (*)    Glucose, Bld 135 (*)    Calcium 8.8 (*)    All other components within normal limits  CBC  TROPONIN I   ____________________________________________   EKG  Interpreted by me Normal sinus rhythm rate of 95, normal axis intervals QRS ST segments and T waves.  No evidence of right heart strain.  Nonischemic.  ____________________________________________    RADIOLOGY  Dg Chest 2 View  Result Date: 03/09/2018 CLINICAL DATA:  Chest pain for 2 days EXAM: CHEST - 2 VIEW COMPARISON:  03/05/2017 FINDINGS: Cardiac shadow is within normal limits.  The lungs are well aerated bilaterally. No focal infiltrate or sizable effusion is seen. No bony abnormality is noted. IMPRESSION: No acute abnormality seen. Electronically Signed   By: Inez Catalina M.D.   On: 03/09/2018 09:01    ____________________________________________   PROCEDURES Procedures  ____________________________________________  DIFFERENTIAL DIAGNOSIS   Bronchospasm due to secondhand smoke exposure, bronchitis, chest wall strain.  CLINICAL IMPRESSION / ASSESSMENT AND PLAN / ED COURSE  Pertinent labs & imaging results that were available during my care of the patient were reviewed by me and considered in my medical decision making (see chart  for details).    Patient presents with atypical chest pain and reassuring physical exam.  Vital signs are normal, making PE extremely unlikely especially in the absence of strong risk factors.Considering the patient's symptoms, medical history, and physical examination today, I have low suspicion for ACS, PE, TAD, pneumothorax, carditis, mediastinitis, pneumonia, CHF, or sepsis.        ____________________________________________   FINAL CLINICAL IMPRESSION(S) / ED DIAGNOSES    Final diagnoses:  Chest wall pain  Bronchitis     ED Discharge Orders         Ordered    albuterol (PROVENTIL HFA;VENTOLIN HFA) 108 (90 Base) MCG/ACT inhaler  Every 6 hours PRN     03/09/18 1230          Portions of this note were generated with dragon dictation software. Dictation errors may occur despite best attempts at proofreading.   Carrie Mew, MD 03/09/18 1234

## 2018-03-23 ENCOUNTER — Emergency Department
Admission: EM | Admit: 2018-03-23 | Discharge: 2018-03-23 | Disposition: A | Discharge status: Home / Self Care | Payer: Medicare Other | Attending: Emergency Medicine | Admitting: Emergency Medicine

## 2018-03-23 ENCOUNTER — Other Ambulatory Visit: Payer: Self-pay

## 2018-03-23 DIAGNOSIS — Z87891 Personal history of nicotine dependence: Secondary | ICD-10-CM | POA: Insufficient documentation

## 2018-03-23 DIAGNOSIS — Z79899 Other long term (current) drug therapy: Secondary | ICD-10-CM | POA: Diagnosis not present

## 2018-03-23 DIAGNOSIS — Z7982 Long term (current) use of aspirin: Secondary | ICD-10-CM | POA: Insufficient documentation

## 2018-03-23 DIAGNOSIS — M545 Low back pain: Secondary | ICD-10-CM | POA: Diagnosis present

## 2018-03-23 DIAGNOSIS — Z794 Long term (current) use of insulin: Secondary | ICD-10-CM | POA: Insufficient documentation

## 2018-03-23 DIAGNOSIS — E119 Type 2 diabetes mellitus without complications: Secondary | ICD-10-CM | POA: Insufficient documentation

## 2018-03-23 DIAGNOSIS — M5432 Sciatica, left side: Secondary | ICD-10-CM | POA: Insufficient documentation

## 2018-03-23 DIAGNOSIS — I10 Essential (primary) hypertension: Secondary | ICD-10-CM | POA: Insufficient documentation

## 2018-03-23 MED ORDER — NAPROXEN 500 MG PO TABS: 500.0000 mg | ORAL_TABLET | Freq: Two times a day (BID) | ORAL | 2 refills | Status: DC

## 2018-03-23 MED ORDER — KETOROLAC TROMETHAMINE 30 MG/ML IJ SOLN
30.0000 mg | Freq: Once | INTRAMUSCULAR | Status: AC
  Administered 2018-03-23: 30 mg via INTRAMUSCULAR
  Filled 2018-03-23: qty 1

## 2018-03-23 NOTE — ED Triage Notes (Signed)
Pt stated that since Wednesday he has been having pain in his left hip that radiating down into his leg. Denies any injury to site.

## 2018-03-23 NOTE — ED Provider Notes (Signed)
[facility] Emergency Department Provider Note   ____________________________________________    I have reviewed the triage vital signs and the nursing notes.   HISTORY  Chief Complaint Leg Pain     HPI Randy [name] is a 68 y.o. male with a history of diabetes who presents today with complaints of left lower back pain with radiation into the left leg.  No numbness weakness or tingling.  No loss of urinary continence.  He describes the pain is moderate and worse with flexion at the waist and occasionally rotating to the left.  He reports a started 2 days ago.  No abdominal pain.  Has not take anything for this.  No injuries reported.  No trauma  Past Medical History:  Diagnosis Date  . Diabetes mellitus without complication ([place])   . Hypercholesteremia   . Hypertension     There are no active problems to display for this patient.   Past Surgical History:  Procedure Laterality Date  . HERNIA REPAIR      Prior to Admission medications   Medication Sig Start Date End Date Taking? Authorizing Provider  albuterol (PROVENTIL HFA;VENTOLIN HFA) 108 (90 Base) MCG/ACT inhaler Inhale 2 puffs into the lungs every 6 (six) hours as needed for wheezing or shortness of breath. 03/09/18   Carrie Mew, MD  aspirin 81 MG tablet Take 81 mg by mouth daily.    [provider]  atorvastatin (LIPITOR) 40 MG tablet Take 40 mg by mouth daily.    [provider]  fluticasone (FLONASE) 50 MCG/ACT nasal spray USE 2 SPRAYS INTO EACH NOSTRIL EVERY DAY 05/28/17   [provider]  insulin glargine (LANTUS) 100 UNIT/ML injection Inject 50 Units into the skin 2 (two) times daily.    [provider]  insulin lispro (HUMALOG) 100 UNIT/ML injection Inject 30 Units into the skin 3 (three) times daily with meals.    [provider]  lisinopril (PRINIVIL,ZESTRIL) 10 MG tablet Take 10 mg by mouth daily.    [provider]   mirabegron ER (MYRBETRIQ) 25 MG TB24 tablet Take 1 tablet (25 mg total) by mouth daily. 08/07/17   Zara Council A, PA-C  naproxen (NAPROSYN) 500 MG tablet Take 1 tablet (500 mg total) by mouth 2 (two) times daily with a meal. 03/23/18   Lavonia Drafts, MD  SUPER B COMPLEX/C PO TAKE 1 CAPSULE BY MOUTH TWICE A DAY TO HELP WITH CRAMPS 06/29/17   [provider]     Allergies Patient has no known allergies.  Family History  Problem Relation Age of Onset  . Prostate cancer Neg Hx   . Bladder Cancer Neg Hx   . Kidney cancer Neg Hx     Social History Social History   Tobacco Use  . Smoking status: Former Smoker    Types: Cigarettes  . Smokeless tobacco: Never Used  Substance Use Topics  . Alcohol use: Yes    Comment: infreqently  . Drug use: No    Review of Systems  Constitutional: No fever/chills Eyes: No visual changes.  ENT: No neck pain Cardiovascular: Denies chest pain. Respiratory: Denies shortness of breath. Gastrointestinal: No abdominal pain.    Genitourinary: Negative for incontinence, no dysuria Musculoskeletal: As above Skin: No rash Neurological: Negative for numbness weakness   ____________________________________________   PHYSICAL EXAM:  VITAL SIGNS: ED Triage Vitals  Enc Vitals Group     BP 03/23/18 0610 (!) 145/71     Pulse Rate 03/23/18 0610  81     Resp 03/23/18 0610 18     Temp 03/23/18 0610 97.9 F (36.6 C)     Temp Source 03/23/18 0610 Oral     SpO2 03/23/18 0610 96 %     Weight 03/23/18 0607 124.7 kg (275 lb)     Height 03/23/18 0607 1.727 m (5\' 8" )     Head Circumference --      Peak Flow --      Pain Score 03/23/18 0607 10     Pain Loc --      Pain Edu? --      Excl. in [place]? --     Constitutional: Alert and oriented. No acute distress.  Nose: No congestion/rhinnorhea. Mouth/Throat: Mucous membranes are moist.   Neck: Painless range of motion, no vertebral tenderness palpation Cardiovascular: Normal rate, regular  rhythm. Grossly normal heart sounds.  Good peripheral circulation. Respiratory: Normal respiratory effort.  No retractions. Gastrointestinal: Soft and nontender. No distention.   Genitourinary: deferred Musculoskeletal: Normal strength in the lower extremities, warm and well perfused, normal range of motion.  Back: No vertebral tenderness to palpation, left paraspinal lumbar area of mild tenderness consistent with muscle spasm Neurologic:  Normal speech and language. No gross focal neurologic deficits are appreciated.  No saddle anesthesia, normal strength in lower extremities, sensation grossly intact Skin:  Skin is warm, dry and intact. Psychiatric: Mood and affect are normal. Speech and behavior are normal.  ____________________________________________   LABS (all labs ordered are listed, but only abnormal results are displayed)  Labs Reviewed - No data to display ____________________________________________  EKG  None ____________________________________________  RADIOLOGY  None ____________________________________________   PROCEDURES  Procedure(s) performed: No  Procedures   Critical Care performed: No ____________________________________________   INITIAL IMPRESSION / ASSESSMENT AND PLAN / ED COURSE  Pertinent labs & imaging results that were available during my care of the patient were reviewed by me and considered in my medical decision making (see chart for details).  Patient well-appearing and in no acute distress.  Exam and HPI most consistent with sciatica possibly related to muscle spasm.  Patient is diabetic so we will avoid steroids at this time, will treat with Naprosyn, supportive care.  Recommend outpatient follow-up    ____________________________________________   FINAL CLINICAL IMPRESSION(S) / ED DIAGNOSES  Final diagnoses:  Sciatica of left side        Note:  This document was prepared using Dragon voice recognition software and may  include unintentional dictation errors.   Lavonia Drafts, MD 03/23/18 [phone]

## 2018-08-03 ENCOUNTER — Other Ambulatory Visit: Payer: Self-pay | Admitting: *Deleted

## 2018-08-03 DIAGNOSIS — N138 Other obstructive and reflux uropathy: Secondary | ICD-10-CM

## 2018-08-06 ENCOUNTER — Other Ambulatory Visit: Payer: Medicare Other

## 2018-08-06 ENCOUNTER — Encounter: Payer: Self-pay | Admitting: Urology

## 2018-08-07 DIAGNOSIS — M6530 Trigger finger, unspecified finger: Secondary | ICD-10-CM | POA: Insufficient documentation

## 2018-08-07 DIAGNOSIS — M19079 Primary osteoarthritis, unspecified ankle and foot: Secondary | ICD-10-CM | POA: Insufficient documentation

## 2018-08-07 NOTE — Progress Notes (Deleted)
08/08/2018 2:05 PM   Randy [name] 05/07/1950 [id number]  Referring provider: [facility] [address] [place], [place] [address]  No chief complaint on file.   HPI: 69 yo AAM with DM, nocturia and BPH with LU TS who presents today for follow up.  BPH WITH LUTS  (prostate and/or bladder) IPSS score: *** PVR: ***   Previous score: 10/2  Previous PVR: 17 mL  Major complaint(s):  x *** years. Denies any dysuria, hematuria or suprapubic pain.   Currently taking: ***.  His has had ***.   Denies any recent fevers, chills, nausea or vomiting.  He has a family history of PCa, with ***.   He does not have a family history of PCa.***    Score:  1-7 Mild 8-19 Moderate 20-35 Severe    PMH: Past Medical History:  Diagnosis Date  . Diabetes mellitus without complication ([place])   . Hypercholesteremia   . Hypertension     Surgical History: Past Surgical History:  Procedure Laterality Date  . HERNIA REPAIR      Home Medications:  Allergies as of 08/08/2018   No Known Allergies     Medication List       Accurate as of August 07, 2018  2:05 PM. If you have any questions, ask your nurse or doctor.        albuterol 108 (90 Base) MCG/ACT inhaler Commonly known as:  VENTOLIN HFA Inhale 2 puffs into the lungs every 6 (six) hours as needed for wheezing or shortness of breath.   aspirin 81 MG tablet Take 81 mg by mouth daily.   atorvastatin 40 MG tablet Commonly known as:  LIPITOR Take 40 mg by mouth daily.   fluticasone 50 MCG/ACT nasal spray Commonly known as:  FLONASE USE 2 SPRAYS INTO EACH NOSTRIL EVERY DAY   insulin glargine 100 UNIT/ML injection Commonly known as:  LANTUS Inject 50 Units into the skin 2 (two) times daily.   insulin lispro 100 UNIT/ML injection Commonly known as:  HUMALOG Inject 30 Units into the skin 3 (three) times daily with meals.   lisinopril 10 MG tablet Commonly known as:  ZESTRIL Take  10 mg by mouth daily.   mirabegron ER 25 MG Tb24 tablet Commonly known as:  MYRBETRIQ Take 1 tablet (25 mg total) by mouth daily.   naproxen 500 MG tablet Commonly known as:  Naprosyn Take 1 tablet (500 mg total) by mouth 2 (two) times daily with a meal.   SUPER B COMPLEX/C PO TAKE 1 CAPSULE BY MOUTH TWICE A DAY TO HELP WITH CRAMPS       Allergies: No Known Allergies  Family History: Family History  Problem Relation Age of Onset  . Prostate cancer Neg Hx   . Bladder Cancer Neg Hx   . Kidney cancer Neg Hx     Social History:  reports that he has quit smoking. His smoking use included cigarettes. He has never used smokeless tobacco. He reports current alcohol use. He reports that he does not use drugs.  ROS:                                        Physical Exam: There were no vitals taken for this visit.  Constitutional:  Well nourished. Alert and oriented, No acute distress. HEENT: [place] AT, moist mucus membranes.  Trachea midline, no masses.  Cardiovascular: No clubbing, cyanosis, or edema. Respiratory: Normal respiratory effort, no increased work of breathing. GI: Abdomen is soft, non tender, non distended, no abdominal masses. Liver and spleen not palpable.  No hernias appreciated.  Stool sample for occult testing is not indicated.   GU: No CVA tenderness.  No bladder fullness or masses.  Patient with circumcised/uncircumcised phallus. ***Foreskin easily retracted***  Urethral meatus is patent.  No penile discharge. No penile lesions or rashes. Scrotum without lesions, cysts, rashes and/or edema.  Testicles are located scrotally bilaterally. No masses are appreciated in the testicles. Left and right epididymis are normal. Rectal: Patient with  normal sphincter tone. Anus and perineum without scarring or rashes. No rectal masses are appreciated. Prostate is approximately *** grams, *** nodules are appreciated. Seminal vesicles are normal. Skin: No rashes,  bruises or suspicious lesions. Lymph: No cervical or inguinal adenopathy. Neurologic: Grossly intact, no focal deficits, moving all 4 extremities. Psychiatric: Normal mood and affect.  Laboratory Data: PSA   1.0  07/12/2017 Lab Results  Component Value Date   WBC 6.6 03/09/2018   HGB 13.0 03/09/2018   HCT 39.0 03/09/2018   MCV 85.2 03/09/2018   PLT 308 03/09/2018    Lab Results  Component Value Date   CREATININE 0.83 03/09/2018    No results found for: PSA  No results found for: TESTOSTERONE  No results found for: HGBA1C  No results found for: TSH  No results found for: CHOL, HDL, CHOLHDL, VLDL, LDLCALC  No results found for: AST No results found for: ALT No components found for: ALKALINEPHOPHATASE No components found for: BILIRUBINTOTAL  No results found for: ESTRADIOL  Urinalysis    Component Value Date/Time   COLORURINE YELLOW (A) 04/11/2015 0433   APPEARANCEUR CLEAR (A) 04/11/2015 0433   APPEARANCEUR Clear 01/29/2013 1330   LABSPEC 1.016 04/11/2015 0433   LABSPEC 1.016 01/29/2013 1330   PHURINE 5.0 04/11/2015 0433   GLUCOSEU 50 (A) 04/11/2015 0433   GLUCOSEU 50 mg/dL 01/29/2013 1330   HGBUR NEGATIVE 04/11/2015 [place] 04/11/2015 0433   BILIRUBINUR Negative 01/29/2013 1330   KETONESUR NEGATIVE 04/11/2015 0433   PROTEINUR NEGATIVE 04/11/2015 0433   NITRITE NEGATIVE 04/11/2015 0433   LEUKOCYTESUR NEGATIVE 04/11/2015 0433   LEUKOCYTESUR Negative 01/29/2013 1330    I have reviewed the labs.   Pertinent Imaging: ***    I have independently reviewed the films.    Assessment & Plan:    1. Nocturia Sleeping with BiPAP machine nightly - still getting up x 3 Myrberiq 25 mg daily has reduced nocturia to 1 to 2 times nightly Send prescription for Myrbetriq 25 mg daily to pharmacy - given samples # 28 in case a PA is needed to fill the script RTC in 12 months for I PSS and PVR  2. BPH with LU TS I PSS score today is 7/2, it is  improving Continue conservative management, avoiding bladder irritants and timed voiding's Most bothersome symptoms is/are nocturia - improved with BiPAP and Myrbetriq  Continue tamsulosin 0.4 mg daily and finasteride 5 mg daily     No follow-ups on file.  These notes generated with voice recognition software. I apologize for typographical errors.  Zara Council, PA-C  [facility] [address] [place], [place] [address] [phone]

## 2018-08-08 ENCOUNTER — Encounter: Payer: Self-pay | Admitting: Urology

## 2018-08-08 ENCOUNTER — Ambulatory Visit: Payer: Medicare Other | Admitting: Urology

## 2018-09-25 ENCOUNTER — Telehealth: Payer: Self-pay | Admitting: Urology

## 2018-09-25 NOTE — Telephone Encounter (Signed)
Would you call Mr. Cassin and get him rescheduled for his missed appointment?  He will need a PSA prior.

## 2018-10-01 ENCOUNTER — Other Ambulatory Visit: Payer: Medicare Other

## 2018-10-01 ENCOUNTER — Other Ambulatory Visit: Payer: Self-pay

## 2018-10-01 DIAGNOSIS — N138 Other obstructive and reflux uropathy: Secondary | ICD-10-CM

## 2018-10-02 LAB — PSA: Prostate Specific Ag, Serum: 0.8 ng/mL (ref 0.0–4.0)

## 2018-10-04 NOTE — Progress Notes (Signed)
10/05/2018 3:43 PM   [place] 1950-04-11 [id number]  Referring provider: [facility], [place],  [place] [address]  Chief Complaint  Patient presents with  . Nocturia    HPI: 68 yo male with DM, nocturia and BPH with LU TS who presents today for follow up.  Nocturia He continues to use his BiPAP machine, but he only wears it three nights a week.   BPH WITH LUTS  (prostate and/or bladder) I PSS 7/1  PVR  21 mL    Previous IPSS score: 10/2  Previous PVR: 17 mL    Major complaint(s): Nocturia x 2.   Denies any dysuria, hematuria or suprapubic pain.   Currently taking: Tamsulosin 0.4 mg and finasteride 5 mg daily  Denies any recent fevers, chills, nausea or vomiting.  He does not have a family history of PCa.  IPSS    Row Name 10/05/18 1000         International Prostate Symptom Score   How often have you had the sensation of not emptying your bladder?  Not at All     How often have you had to urinate less than every two hours?  Less than half the time     How often have you found you stopped and started again several times when you urinated?  Not at All     How often have you found it difficult to postpone urination?  Less than 1 in 5 times     How often have you had a weak urinary stream?  Not at All     How often have you had to strain to start urination?  Not at All     How many times did you typically get up at night to urinate?  4 Times     Total IPSS Score  7       Quality of Life due to urinary symptoms   If you were to spend the rest of your life with your urinary condition just the way it is now how would you feel about that?  Pleased        Score:  1-7 Mild 8-19 Moderate 20-35 Severe     PMH: Past Medical History:  Diagnosis Date  . Diabetes mellitus without complication ([place])   . Hypercholesteremia   . Hypertension     Surgical History: Past Surgical History:  Procedure Laterality  Date  . HERNIA REPAIR      Home Medications:  Allergies as of 10/05/2018   No Known Allergies     Medication List       Accurate as of October 05, 2018 11:59 PM. If you have any questions, ask your nurse or doctor.        STOP taking these medications   albuterol 108 (90 Base) MCG/ACT inhaler Commonly known as: VENTOLIN HFA Stopped by: Beola Vasallo, PA-C   fluticasone 50 MCG/ACT nasal spray Commonly known as: FLONASE Stopped by: Zara Council, PA-C   mirabegron ER 25 MG Tb24 tablet Commonly known as: MYRBETRIQ Stopped by: Billie Intriago, PA-C   naproxen 500 MG tablet Commonly known as: Naprosyn Stopped by: Jamilyn Pigeon, PA-C   SUPER B COMPLEX/C PO Stopped by: Zeynep Fantroy, PA-C     TAKE these medications   aspirin 81 MG tablet Take 81 mg by mouth daily.   atorvastatin 40 MG tablet Commonly known as: LIPITOR Take 40 mg by mouth daily.   finasteride 5 MG  tablet Commonly known as: PROSCAR TAKE 1 TABLET BY MOUTH DAILY FOR ENLARGED PROSTATE ALONG WITH TAMSULOSIN   insulin glargine 100 UNIT/ML injection Commonly known as: LANTUS Inject 50 Units into the skin 2 (two) times daily.   insulin lispro 100 UNIT/ML injection Commonly known as: HUMALOG Inject 30 Units into the skin 3 (three) times daily with meals.   lisinopril 10 MG tablet Commonly known as: ZESTRIL Take 10 mg by mouth daily.   tamsulosin 0.4 MG Caps capsule Commonly known as: FLOMAX TAKE 1 CAPSULE BY MOUTH EVERY DAY 30 MINUTES AFTER THE SAME MEAL       Allergies: No Known Allergies  Family History: Family History  Problem Relation Age of Onset  . Prostate cancer Neg Hx   . Bladder Cancer Neg Hx   . Kidney cancer Neg Hx     Social History:  reports that he has quit smoking. His smoking use included cigarettes. He has never used smokeless tobacco. He reports current alcohol use. He reports that he does not use drugs.  ROS: UROLOGY Frequent Urination?: No Hard to postpone  urination?: No Burning/pain with urination?: No Get up at night to urinate?: Yes Leakage of urine?: No Urine stream starts and stops?: No Trouble starting stream?: No Do you have to strain to urinate?: No Blood in urine?: No Urinary tract infection?: No Sexually transmitted disease?: No Injury to kidneys or bladder?: No Painful intercourse?: No Weak stream?: No Erection problems?: No Penile pain?: No  Gastrointestinal Nausea?: No Vomiting?: No Indigestion/heartburn?: No Diarrhea?: No Constipation?: No  Constitutional Fever: No Night sweats?: No Weight loss?: No Fatigue?: No  Skin Skin rash/lesions?: No Itching?: No  Eyes Blurred vision?: No Double vision?: No  Ears/Nose/Throat Sore throat?: No Sinus problems?: Yes  Hematologic/Lymphatic Swollen glands?: No Easy bruising?: No  Cardiovascular Leg swelling?: No Chest pain?: No  Respiratory Cough?: No Shortness of breath?: No  Endocrine Excessive thirst?: No  Musculoskeletal Back pain?: No Joint pain?: No  Neurological Headaches?: No Dizziness?: No  Psychologic Depression?: No Anxiety?: No  Physical Exam: BP (!) 171/84 (BP Location: Left Arm, Patient Position: Sitting, Cuff Size: Normal)   Pulse 89   Ht 5\' 8"  (1.727 m)   Wt 273 lb 12.8 oz (124.2 kg)   BMI 41.63 kg/m   Constitutional:  Well nourished. Alert and oriented, No acute distress. HEENT: [place] AT, moist mucus membranes.  Trachea midline, no masses. Cardiovascular: No clubbing, cyanosis, or edema. Respiratory: Normal respiratory effort, no increased work of breathing. GI: Abdomen is soft, non tender, non distended, no abdominal masses. Liver and spleen not palpable.  No hernias appreciated.  Stool sample for occult testing is not indicated.   GU: No CVA tenderness.  No bladder fullness or masses.  Patient with uncircumcised phallus.  Foreskin easily retracted.  Urethral meatus is patent.  No penile discharge. Smegma.  No penile lesions  or rashes. Scrotum without lesions, cysts, rashes and/or edema.  Testicles are located scrotally bilaterally. No masses are appreciated in the testicles. Left and right epididymis are normal. Rectal: Patient with  normal sphincter tone. Anus and perineum without scarring or rashes. No rectal masses are appreciated. Prostate is approximately 50 grams, could only palpate the apex and the midportion of the glands, no nodules are appreciated. Seminal vesicles could not be palpated.   Skin: No rashes, bruises or suspicious lesions. Lymph: No  inguinal adenopathy. Neurologic: Grossly intact, no focal deficits, moving all 4 extremities. Psychiatric: Normal mood and affect.   Laboratory Data:  PSA   1.0  07/12/2017 PSA    0.8 09/2018 Lab Results  Component Value Date   WBC 6.6 03/09/2018   HGB 13.0 03/09/2018   HCT 39.0 03/09/2018   MCV 85.2 03/09/2018   PLT 308 03/09/2018    Lab Results  Component Value Date   CREATININE 0.83 03/09/2018    No results found for: PSA  No results found for: TESTOSTERONE  No results found for: HGBA1C  No results found for: TSH  No results found for: CHOL, HDL, CHOLHDL, VLDL, LDLCALC  No results found for: AST No results found for: ALT No components found for: ALKALINEPHOPHATASE No components found for: BILIRUBINTOTAL  No results found for: ESTRADIOL  Urinalysis    Component Value Date/Time   COLORURINE YELLOW (A) 04/11/2015 0433   APPEARANCEUR CLEAR (A) 04/11/2015 0433   APPEARANCEUR Clear 01/29/2013 1330   LABSPEC 1.016 04/11/2015 0433   LABSPEC 1.016 01/29/2013 1330   PHURINE 5.0 04/11/2015 0433   GLUCOSEU 50 (A) 04/11/2015 0433   GLUCOSEU 50 mg/dL 01/29/2013 1330   HGBUR NEGATIVE 04/11/2015 [place] 04/11/2015 0433   BILIRUBINUR Negative 01/29/2013 1330   KETONESUR NEGATIVE 04/11/2015 0433   PROTEINUR NEGATIVE 04/11/2015 0433   NITRITE NEGATIVE 04/11/2015 0433   LEUKOCYTESUR NEGATIVE 04/11/2015 0433   LEUKOCYTESUR  Negative 01/29/2013 1330    I have reviewed the labs.   Pertinent Imaging: Results for [place]ing[place], Randy [name] (MRN [id number]) as of 10/08/2018 15:47  Ref. Range 10/05/2018 10:48  Scan Result Unknown 21 mL   Assessment & Plan:    1. Nocturia Not sleeping with the BiPAP consistently and accepts that he may get up more often at night to urinate because of this  2. BPH with LU TS I PSS score today is 7/2, it is improving Continue conservative management, avoiding bladder irritants and timed voiding's Most bothersome symptoms is/are nocturia - see # 1 Continue tamsulosin 0.4 mg daily and finasteride 5 mg daily     Return in about 1 year (around 10/05/2019) for IPSS, PSA and exam.  These notes generated with voice recognition software. I apologize for typographical errors.  Zara Council, PA-C  [facility] [address] [place], [place] [address] [phone]

## 2018-10-05 ENCOUNTER — Other Ambulatory Visit: Payer: Self-pay

## 2018-10-05 ENCOUNTER — Ambulatory Visit (INDEPENDENT_AMBULATORY_CARE_PROVIDER_SITE_OTHER): Payer: Medicare Other | Admitting: Urology

## 2018-10-05 ENCOUNTER — Encounter: Payer: Self-pay | Admitting: Urology

## 2018-10-05 VITALS — BP 171/84 | HR 89 | Ht 68.0 in | Wt 273.8 lb

## 2018-10-05 DIAGNOSIS — R351 Nocturia: Secondary | ICD-10-CM | POA: Diagnosis not present

## 2018-10-05 DIAGNOSIS — N401 Enlarged prostate with lower urinary tract symptoms: Secondary | ICD-10-CM | POA: Diagnosis not present

## 2018-10-05 DIAGNOSIS — N138 Other obstructive and reflux uropathy: Secondary | ICD-10-CM | POA: Diagnosis not present

## 2018-10-05 LAB — BLADDER SCAN AMB NON-IMAGING: Scan Result: 21

## 2019-09-05 ENCOUNTER — Ambulatory Visit: Payer: Self-pay | Admitting: Surgery

## 2019-09-12 ENCOUNTER — Other Ambulatory Visit: Payer: Self-pay

## 2019-09-12 ENCOUNTER — Ambulatory Visit (INDEPENDENT_AMBULATORY_CARE_PROVIDER_SITE_OTHER): Payer: Medicare Other | Admitting: Surgery

## 2019-09-12 ENCOUNTER — Encounter: Payer: Self-pay | Admitting: Surgery

## 2019-09-12 VITALS — BP 139/68 | HR 102 | Temp 98.4°F | Resp 12 | Ht 67.0 in | Wt 260.0 lb

## 2019-09-12 DIAGNOSIS — M6208 Separation of muscle (nontraumatic), other site: Secondary | ICD-10-CM

## 2019-09-12 DIAGNOSIS — R1013 Epigastric pain: Secondary | ICD-10-CM | POA: Diagnosis not present

## 2019-09-12 NOTE — Progress Notes (Signed)
Patient ID: Randy [name], male   DOB: [birth year], 69 y.o.   MRN: [id number]  Chief Complaint: Recurrent umbilical hernia.  History of Present Illness Randy [name] is a 69 y.o. male with history of umbilical hernia repair, 2014 with preperitoneal mesh placement, open technique.  Patient reports a pain that is 8 out of 10, exacerbated by heavy lifting, arising from supine positioning.  He denies any presence of specific or new bulge.  He reports a good appetite.  He denies fevers, chills, nausea, vomiting or bowel habit changes.  He reports the pain has gotten worse over the last 2 months.  Past Medical History Past Medical History:  Diagnosis Date  . Diabetes mellitus without complication ([place])   . Hypercholesteremia   . Hypertension   . Sleep apnea       Past Surgical History:  Procedure Laterality Date  . HERNIA REPAIR      No Known Allergies  Current Outpatient Medications  Medication Sig Dispense Refill  . albuterol (VENTOLIN HFA) 108 (90 Base) MCG/ACT inhaler Inhale into the lungs.    Marland Kitchen allopurinol (ZYLOPRIM) 300 MG tablet Take 300 mg by mouth daily.    Marland Kitchen aspirin 81 MG tablet Take 81 mg by mouth daily.    Marland Kitchen atorvastatin (LIPITOR) 40 MG tablet Take 40 mg by mouth daily.    . finasteride (PROSCAR) 5 MG tablet TAKE 1 TABLET BY MOUTH DAILY FOR ENLARGED PROSTATE ALONG WITH TAMSULOSIN    . fluticasone (FLONASE) 50 MCG/ACT nasal spray Place 2 sprays into both nostrils daily.    . insulin glargine (LANTUS) 100 UNIT/ML injection Inject 50 Units into the skin 2 (two) times daily.    . insulin lispro (HUMALOG) 100 UNIT/ML injection Inject 30 Units into the skin 3 (three) times daily with meals.    Marland Kitchen lisinopril (PRINIVIL,ZESTRIL) 10 MG tablet Take 10 mg by mouth daily.    . predniSONE (DELTASONE) 10 MG tablet SMARTSIG:4 Tablet(s) By Mouth    . tamsulosin (FLOMAX) 0.4 MG CAPS capsule TAKE 1 CAPSULE BY MOUTH EVERY DAY 30 MINUTES AFTER THE SAME MEAL     No current  facility-administered medications for this visit.    Family History Family History  Problem Relation Age of Onset  . Prostate cancer Neg Hx   . Bladder Cancer Neg Hx   . Kidney cancer Neg Hx       Social History Social History   Tobacco Use  . Smoking status: Former Smoker    Types: Cigarettes  . Smokeless tobacco: Never Used  Vaping Use  . Vaping Use: Never used  Substance Use Topics  . Alcohol use: Yes    Comment: infreqently  . Drug use: No        Review of Systems  Constitutional: Negative.   HENT: Negative.   Eyes: Negative.   Respiratory: Negative.   Cardiovascular: Negative.   Gastrointestinal: Positive for abdominal pain.  Genitourinary: Negative.   Skin: Negative.   Neurological: Negative.   Psychiatric/Behavioral: Negative.       Physical Exam Blood pressure 139/68, pulse (!) 102, temperature 98.4 F (36.9 C), resp. rate 12, height 5\' 7"  (1.702 m), weight 260 lb (117.9 kg), SpO2 96 %. Last Weight  Most recent update: 09/12/2019  9:57 AM   Weight  117.9 kg (260 lb)            CONSTITUTIONAL: Well developed, and nourished, appropriately responsive and aware without distress.   EYES: Sclera non-icteric.   EARS, NOSE, MOUTH  AND THROAT: Mask worn.    Hearing is intact to voice.  NECK: Trachea is midline, and there is no jugular venous distension.  LYMPH NODES:  Lymph nodes in the neck are not enlarged. RESPIRATORY:  Lungs are clear, and breath sounds are equal bilaterally. Normal respiratory effort without pathologic use of accessory muscles. CARDIOVASCULAR: Heart is regular in rate and rhythm. GI: The abdomen is soft, nontender, and nondistended. There were no palpable masses.  He has a remarkable diastases recti, without appreciable fascial defects.  On intense evaluation of the periumbilical region I did not identify any fascial defect.  There is no distinct mass or bulge present in the area either.  I did not appreciate hepatosplenomegaly. There  were normal bowel sounds.  MUSCULOSKELETAL:  Symmetrical muscle tone appreciated in all four extremities.    SKIN: Skin turgor is normal. No pathologic skin lesions appreciated.  NEUROLOGIC:  Motor and sensation appear grossly normal.  Cranial nerves are grossly without defect. PSYCH:  Alert and oriented to person, place and time. Affect is appropriate for situation.  Data Reviewed I have personally reviewed what is currently available of the patient's imaging, recent labs and medical records.   Labs:  CBC Latest Ref Rng & Units 03/09/2018 03/05/2017 01/29/2013  WBC 4.0 - 10.5 K/uL 6.6 5.9 7.5  Hemoglobin 13.0 - 17.0 g/dL 13.0 12.7(L) 11.9(L)  Hematocrit 39 - 52 % 39.0 38.6(L) 35.2(L)  Platelets 150 - 400 K/uL 308 278 261   CMP Latest Ref Rng & Units 03/09/2018 03/05/2017 01/29/2013  Glucose 70 - 99 mg/dL 135(H) 148(H) 188(H)  BUN 8 - 23 mg/dL 12 16 20(H)  Creatinine 0.61 - 1.24 mg/dL 0.83 0.75 1.30  Sodium 135 - 145 mmol/L 133(L) 136 132(L)  Potassium 3.5 - 5.1 mmol/L 3.9 3.9 4.3  Chloride 98 - 111 mmol/L 102 102 102  CO2 22 - 32 mmol/L 21(L) 24 25  Calcium 8.9 - 10.3 mg/dL 8.8(L) 9.0 8.7      Imaging:  Within last 24 hrs: No results found.  Assessment    Pain likely secondary to diastases recti and rectus strain.  No clinical evidence of recurrent hernia. Patient Active Problem List   Diagnosis Date Noted  . Acquired trigger finger 08/07/2018  . 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 weight loss.  We discussed the detail of not doing any drastic terms of diet or exercise but to do sustainable and consistent application of calorie burning efforts with diminished calorie consumption.  I believe he understands this and desires to pursue the route.  I do not have anything else to offer, I do not believe a CT scan is necessary to have objective imaging regarding my physical  examination.  Face-to-face time spent with the patient and accompanying care providers(if present) was 30 minutes, with more than 50% of the time spent counseling, educating, and coordinating care of the patient.      Ronny Bacon M.D., FACS 09/12/2019, 4:10 PM

## 2019-09-12 NOTE — Patient Instructions (Signed)
Follow up as needed, call the office if you have any questions or concerns.  

## 2019-10-09 ENCOUNTER — Other Ambulatory Visit: Payer: Medicare Other

## 2019-10-11 ENCOUNTER — Ambulatory Visit: Payer: Medicare Other | Admitting: Urology

## 2019-11-05 ENCOUNTER — Other Ambulatory Visit: Payer: Self-pay | Admitting: Family Medicine

## 2019-11-05 DIAGNOSIS — N138 Other obstructive and reflux uropathy: Secondary | ICD-10-CM

## 2019-11-06 ENCOUNTER — Other Ambulatory Visit: Payer: Self-pay

## 2019-11-06 ENCOUNTER — Other Ambulatory Visit: Payer: Medicare Other

## 2019-11-06 DIAGNOSIS — N138 Other obstructive and reflux uropathy: Secondary | ICD-10-CM

## 2019-11-07 LAB — PSA: Prostate Specific Ag, Serum: 1.1 ng/mL (ref 0.0–4.0)

## 2019-11-12 NOTE — Progress Notes (Signed)
11/13/2019 9:21 PM   [place] 07-13-1950 [id number]  Referring provider: [facility], [place],  [place] [address]  Chief Complaint  Patient presents with  . Benign Prostatic Hypertrophy    HPI: 69 yo male with DM, nocturia and BPH with LU TS who presents today for follow up.  Nocturia His BiPAP machine has been recalled.   BPH WITH LUTS  (prostate and/or bladder) I PSS 11/2    Previous IPSS score: 7/1  Previous PVR: 21 mL    Major complaint(s): Nocturia x 3-4.  Has not been sleeping with his BiPAP due to a recall.   Patient denies any modifying or aggravating factors.  Patient denies any gross hematuria, dysuria or suprapubic/flank pain.  Patient denies any fevers, chills, nausea or vomiting.   Currently taking: Tamsulosin 0.4 mg and finasteride 5 mg daily  He does not have a family history of PCa.   IPSS    Row Name 11/13/19 1400         International Prostate Symptom Score   How often have you had the sensation of not emptying your bladder? Not at All     How often have you had to urinate less than every two hours? Not at All     How often have you found you stopped and started again several times when you urinated? About half the time     How often have you found it difficult to postpone urination? Not at All     How often have you had a weak urinary stream? Less than half the time     How often have you had to strain to start urination? Less than half the time     How many times did you typically get up at night to urinate? 4 Times     Total IPSS Score 11       Quality of Life due to urinary symptoms   If you were to spend the rest of your life with your urinary condition just the way it is now how would you feel about that? Mostly Satisfied            Score:  1-7 Mild 8-19 Moderate 20-35 Severe     PMH: Past Medical History:  Diagnosis Date  . Diabetes mellitus without complication ([place])     . Hypercholesteremia   . Hypertension   . Sleep apnea     Surgical History: Past Surgical History:  Procedure Laterality Date  . HERNIA REPAIR      Home Medications:  Allergies as of 11/13/2019   No Known Allergies     Medication List       Accurate as of November 13, 2019 11:59 PM. If you have any questions, ask your nurse or doctor.        albuterol 108 (90 Base) MCG/ACT inhaler Commonly known as: VENTOLIN HFA Inhale into the lungs.   allopurinol 300 MG tablet Commonly known as: ZYLOPRIM Take 300 mg by mouth daily.   aspirin 81 MG tablet Take 81 mg by mouth daily.   atorvastatin 40 MG tablet Commonly known as: LIPITOR Take 40 mg by mouth daily.   finasteride 5 MG tablet Commonly known as: PROSCAR TAKE 1 TABLET BY MOUTH DAILY FOR ENLARGED PROSTATE ALONG WITH TAMSULOSIN   fluticasone 50 MCG/ACT nasal spray Commonly known as: FLONASE Place 2 sprays into both nostrils daily.   insulin glargine 100 UNIT/ML injection Commonly known as:  LANTUS Inject 50 Units into the skin 2 (two) times daily.   insulin lispro 100 UNIT/ML injection Commonly known as: HUMALOG Inject 30 Units into the skin 3 (three) times daily with meals.   lisinopril 10 MG tablet Commonly known as: ZESTRIL Take 10 mg by mouth daily.   predniSONE 10 MG tablet Commonly known as: DELTASONE SMARTSIG:4 Tablet(s) By Mouth   tamsulosin 0.4 MG Caps capsule Commonly known as: FLOMAX TAKE 1 CAPSULE BY MOUTH EVERY DAY 30 MINUTES AFTER THE SAME MEAL       Allergies: No Known Allergies  Family History: Family History  Problem Relation Age of Onset  . Prostate cancer Neg Hx   . Bladder Cancer Neg Hx   . Kidney cancer Neg Hx     Social History:  reports that he has quit smoking. His smoking use included cigarettes. He has never used smokeless tobacco. He reports current alcohol use. He reports that he does not use drugs.  ROS: For pertinent review of systems please refer to history of  present illness  Physical Exam: BP (!) 178/97 (BP Location: Left Arm, Patient Position: Sitting, Cuff Size: Normal)   Pulse 86   Ht 5\' 7"  (1.702 m)   Wt 265 lb 8 oz (120.4 kg)   BMI 41.58 kg/m   Constitutional:  Well nourished. Alert and oriented, No acute distress. HEENT: [place] AT, mask in place.  Trachea midline Cardiovascular: No clubbing, cyanosis, or edema. Respiratory: Normal respiratory effort, no increased work of breathing. GI: Abdomen is soft, non tender, non distended, no abdominal masses. Liver and spleen not palpable.  No hernias appreciated.  Stool sample for occult testing is not indicated.   GU: No CVA tenderness.  No bladder fullness or masses.  Patient with uncircumcised phallus. Foreskin easily retracted   Urethral meatus is patent.  No penile discharge. No penile lesions or rashes. Scrotum without lesions, cysts, rashes and/or edema.  Testicles are located scrotally bilaterally. No masses are appreciated in the testicles. Left and right epididymis are normal. Rectal: Patient with  normal sphincter tone. Anus and perineum without scarring or rashes. No rectal masses are appreciated. Prostate is approximately 50 grams, could only palpate the apex and the midportion of the gland, no nodules are appreciated. Seminal vesicles could not be palpated Skin: No rashes, bruises or suspicious lesions. Lymph: No inguinal adenopathy. Neurologic: Grossly intact, no focal deficits, moving all 4 extremities. Psychiatric: Normal mood and affect.   Laboratory Data: Component     Latest Ref Rng & Units 07/12/2017 10/01/2018 11/06/2019  Prostate Specific Ag, Serum     0.0 - 4.0 ng/mL 1.0 0.8 1.1   Lab Results  Component Value Date   WBC 6.6 03/09/2018   HGB 13.0 03/09/2018   HCT 39.0 03/09/2018   MCV 85.2 03/09/2018   PLT 308 03/09/2018    Lab Results  Component Value Date   CREATININE 0.83 03/09/2018    Urinalysis Component     Latest Ref Rng & Units 11/13/2019  Specific Gravity,  UA     1.005 - 1.030 1.020  pH, UA     5.0 - 7.5 7.0  Color, UA     Yellow Yellow  Appearance Ur     Clear Clear  Leukocytes,UA     Negative Negative  Protein,UA     Negative/Trace Negative  Glucose, UA     Negative Negative  Ketones, UA     Negative Negative  RBC, UA     Negative Negative  Bilirubin, UA     Negative Negative  Urobilinogen, Ur     0.2 - 1.0 mg/dL 0.2  Nitrite, UA     Negative Negative  Microscopic Examination      See below:   Component     Latest Ref Rng & Units 11/13/2019  WBC, UA     0 - 5 /hpf 0-5  RBC     0 - 2 /hpf None seen  Epithelial Cells (non renal)     0 - 10 /hpf 0-10  Bacteria, UA     None seen/Few None seen    I have reviewed the labs.   Pertinent Imaging: Results for Pfannenstiel, URA HAUSEN (MRN [id number]) as of 10/08/2018 15:47  Ref. Range 10/05/2018 10:48  Scan Result Unknown 21 mL   Assessment & Plan:    1. Nocturia BiPAP has been recalled.  2. BPH with LU TS I PSS score today is 11/2, it is worsening Continue conservative management, avoiding bladder irritants and timed voiding's Most bothersome symptoms is/are nocturia - see # 1 Continue tamsulosin 0.4 mg daily and finasteride 5 mg daily     Return in about 1 year (around 11/12/2020) for IPSS, PSA and exam.  These notes generated with voice recognition software. I apologize for typographical errors.  Zara Council, PA-C  [facility] [address] [place], [place] [address] [phone]

## 2019-11-13 ENCOUNTER — Other Ambulatory Visit: Payer: Self-pay

## 2019-11-13 ENCOUNTER — Encounter: Payer: Self-pay | Admitting: Urology

## 2019-11-13 ENCOUNTER — Ambulatory Visit (INDEPENDENT_AMBULATORY_CARE_PROVIDER_SITE_OTHER): Payer: Medicare Other | Admitting: Urology

## 2019-11-13 VITALS — BP 178/97 | HR 86 | Ht 67.0 in | Wt 265.5 lb

## 2019-11-13 DIAGNOSIS — N401 Enlarged prostate with lower urinary tract symptoms: Secondary | ICD-10-CM

## 2019-11-13 DIAGNOSIS — R351 Nocturia: Secondary | ICD-10-CM

## 2019-11-13 DIAGNOSIS — N138 Other obstructive and reflux uropathy: Secondary | ICD-10-CM

## 2019-11-14 LAB — MICROSCOPIC EXAMINATION
Bacteria, UA: NONE SEEN
RBC, Urine: NONE SEEN /hpf (ref 0–2)

## 2019-11-14 LAB — URINALYSIS, COMPLETE
Bilirubin, UA: NEGATIVE
Glucose, UA: NEGATIVE
Ketones, UA: NEGATIVE
Leukocytes,UA: NEGATIVE
Nitrite, UA: NEGATIVE
Protein,UA: NEGATIVE
RBC, UA: NEGATIVE
Specific Gravity, UA: 1.02 (ref 1.005–1.030)
Urobilinogen, Ur: 0.2 mg/dL (ref 0.2–1.0)
pH, UA: 7 (ref 5.0–7.5)

## 2020-03-06 ENCOUNTER — Other Ambulatory Visit: Payer: Self-pay

## 2020-03-06 ENCOUNTER — Emergency Department: Payer: Medicare Other

## 2020-03-06 ENCOUNTER — Emergency Department
Admission: EM | Admit: 2020-03-06 | Discharge: 2020-03-06 | Disposition: A | Discharge status: Home / Self Care | Payer: Medicare Other | Attending: Emergency Medicine | Admitting: Emergency Medicine

## 2020-03-06 DIAGNOSIS — R55 Syncope and collapse: Secondary | ICD-10-CM | POA: Insufficient documentation

## 2020-03-06 DIAGNOSIS — R780 Finding of alcohol in blood: Secondary | ICD-10-CM | POA: Diagnosis not present

## 2020-03-06 DIAGNOSIS — Z046 Encounter for general psychiatric examination, requested by authority: Secondary | ICD-10-CM | POA: Diagnosis present

## 2020-03-06 DIAGNOSIS — Z7982 Long term (current) use of aspirin: Secondary | ICD-10-CM | POA: Diagnosis not present

## 2020-03-06 DIAGNOSIS — Z87891 Personal history of nicotine dependence: Secondary | ICD-10-CM | POA: Insufficient documentation

## 2020-03-06 DIAGNOSIS — Z794 Long term (current) use of insulin: Secondary | ICD-10-CM | POA: Insufficient documentation

## 2020-03-06 DIAGNOSIS — F3289 Other specified depressive episodes: Secondary | ICD-10-CM

## 2020-03-06 DIAGNOSIS — E782 Mixed hyperlipidemia: Secondary | ICD-10-CM | POA: Diagnosis not present

## 2020-03-06 DIAGNOSIS — I10 Essential (primary) hypertension: Secondary | ICD-10-CM | POA: Diagnosis not present

## 2020-03-06 DIAGNOSIS — E1169 Type 2 diabetes mellitus with other specified complication: Secondary | ICD-10-CM | POA: Insufficient documentation

## 2020-03-06 DIAGNOSIS — Z79899 Other long term (current) drug therapy: Secondary | ICD-10-CM | POA: Diagnosis not present

## 2020-03-06 LAB — COMPREHENSIVE METABOLIC PANEL
ALT: 14 U/L (ref 0–44)
AST: 17 U/L (ref 15–41)
Albumin: 3.6 g/dL (ref 3.5–5.0)
Alkaline Phosphatase: 50 U/L (ref 38–126)
Anion gap: 12 (ref 5–15)
BUN: 9 mg/dL (ref 8–23)
CO2: 22 mmol/L (ref 22–32)
Calcium: 8.8 mg/dL — ABNORMAL LOW (ref 8.9–10.3)
Chloride: 103 mmol/L (ref 98–111)
Creatinine, Ser: 0.62 mg/dL (ref 0.61–1.24)
GFR, Estimated: >60 mL/min (ref >60)
Glucose, Bld: 169 mg/dL — ABNORMAL HIGH (ref 70–99)
Potassium: 3.4 mmol/L — ABNORMAL LOW (ref 3.5–5.1)
Sodium: 137 mmol/L (ref 135–145)
Total Bilirubin: 0.7 mg/dL (ref 0.3–1.2)
Total Protein: 7.2 g/dL (ref 6.5–8.1)

## 2020-03-06 LAB — CBC
HCT: 35.7 % — ABNORMAL LOW (ref 39.0–52.0)
Hemoglobin: 11.9 g/dL — ABNORMAL LOW (ref 13.0–17.0)
MCH: 28.6 pg (ref 26.0–34.0)
MCHC: 33.3 g/dL (ref 30.0–36.0)
MCV: 85.8 fL (ref 80.0–100.0)
Platelets: 293 10*3/uL (ref 150–400)
RBC: 4.16 MIL/uL — ABNORMAL LOW (ref 4.22–5.81)
RDW: 14.2 % (ref 11.5–15.5)
WBC: 7 10*3/uL (ref 4.0–10.5)
nRBC: 0 % (ref 0.0–0.2)

## 2020-03-06 LAB — ETHANOL: Alcohol, Ethyl (B): 181 mg/dL — ABNORMAL HIGH (ref <10)

## 2020-03-06 LAB — TROPONIN I (HIGH SENSITIVITY): Troponin I (High Sensitivity): 6 ng/L (ref <18)

## 2020-03-06 LAB — ACETAMINOPHEN LEVEL: Acetaminophen (Tylenol), Serum: <10 ug/mL — ABNORMAL LOW (ref 10–30)

## 2020-03-06 LAB — SALICYLATE LEVEL: Salicylate Lvl: <7 mg/dL — ABNORMAL LOW (ref 7.0–30.0)

## 2020-03-06 NOTE — ED Notes (Signed)
Pt. Transferred from Triage to room 19H after dressing out and screening for contraband. Report to include Situation, Background, Assessment and Recommendations from [facility]. Pt. Oriented to Quad including Q15 minute rounds as well as Engineer, drilling for their protection. Patient is alert and oriented, warm and dry in no acute distress. Patient denies SI, HI, and AVH. Pt. Encouraged to let me know if needs arise.

## 2020-03-06 NOTE — ED Provider Notes (Addendum)
[facility] Emergency Department Provider Note ____________________________________________   Event Date/Time   First MD Initiated Contact with Patient 03/06/20 2141     (approximate)  I have reviewed the triage vital signs and the nursing notes.  HISTORY  Chief Complaint Loss of Consciousness and Psychiatric Evaluation   HPI Randy [name] is a 70 y.o. malewho presents to the ED for evaluation of syncope.   Chart review indicates DM on insulin, HTN, HLD and on ASA 81 as his only antithrombotic medication. BPH.  Patient reports to the ED after a syncopal episode that occurred after getting up from the toilet.  He reports trying to pass a bowel movement, feeling lightheaded dizziness upon standing and then having a syncopal episode.  Patient reports this happened "a couple hours" prior to arrival.  Patient reports drinking "a couple" alcoholic beverages this evening, but denies additional recreational drug use.  Patient primarily reports significant depression that he attributes to his psychosocial stressors at home, primarily a daughter who is hemodialysis dependent.  Reports significant anhedonia and "just want to be left alone."  He denies suicidal thoughts, suicidal plans, homicidality or hallucinations.  Past Medical History:  Diagnosis Date  . Diabetes mellitus without complication ([place])   . Hypercholesteremia   . Hypertension   . Sleep apnea     Patient Active Problem List   Diagnosis Date Noted  . Diastasis recti 09/12/2019  . Abdominal wall pain in epigastric region 09/12/2019  . Acquired trigger finger 08/07/2018  . 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 HERNIA REPAIR      Prior to Admission medications   Medication Sig Start Date End Date Taking? Authorizing Provider  albuterol (VENTOLIN HFA) 108 (90 Base) MCG/ACT inhaler Inhale into the lungs. 09/09/19   [provider]   allopurinol (ZYLOPRIM) 300 MG tablet Take 300 mg by mouth daily. Patient not taking: Reported on 11/13/2019 08/17/19   [provider]  aspirin 81 MG tablet Take 81 mg by mouth daily.    [provider]  atorvastatin (LIPITOR) 40 MG tablet Take 40 mg by mouth daily.    [provider]  finasteride (PROSCAR) 5 MG tablet TAKE 1 TABLET BY MOUTH DAILY FOR ENLARGED PROSTATE ALONG WITH TAMSULOSIN 08/20/18   [provider]  fluticasone (FLONASE) 50 MCG/ACT nasal spray Place 2 sprays into both nostrils daily. 09/05/19   [provider]  insulin glargine (LANTUS) 100 UNIT/ML injection Inject 50 Units into the skin 2 (two) times daily.    [provider]  insulin lispro (HUMALOG) 100 UNIT/ML injection Inject 30 Units into the skin 3 (three) times daily with meals.    [provider]  lisinopril (PRINIVIL,ZESTRIL) 10 MG tablet Take 10 mg by mouth daily.    [provider]  predniSONE (DELTASONE) 10 MG tablet SMARTSIG:4 Tablet(s) By Mouth 06/03/19   [provider]  tamsulosin (FLOMAX) 0.4 MG CAPS capsule TAKE 1 CAPSULE BY MOUTH EVERY DAY 30 MINUTES AFTER THE SAME MEAL 08/20/18   [provider]    Allergies Patient has no known allergies.  Family History  Problem Relation Age of Onset  . Prostate cancer Neg Hx   . Bladder Cancer Neg Hx   . Kidney cancer Neg Hx     Social History Social History   Tobacco Use  . Smoking status: Former Smoker    Types: Cigarettes  . Smokeless tobacco: Never Used  Vaping Use  .  Vaping Use: Never used  Substance Use Topics  . Alcohol use: Yes    Comment: infreqently  . Drug use: No    Review of Systems  Constitutional: No fever/chills Eyes: No visual changes. ENT: No sore throat. Cardiovascular: Denies chest pain. Respiratory: Denies shortness of breath. Gastrointestinal: No abdominal pain.  No nausea, no vomiting.  No diarrhea.  No constipation. Genitourinary: Negative  for dysuria. Musculoskeletal: Negative for back pain. Skin: Negative for rash. Neurological: Negative for headaches, focal weakness or numbness.  Positive for syncope Psychiatric: Positive for depression, negative for suicidality  ____________________________________________   PHYSICAL EXAM:  VITAL SIGNS: Vitals:   03/06/20 2103 03/06/20 2200  BP: 122/67 (!) 116/56  Pulse: 78 74  Resp: 14 19  SpO2: 97% 95%     Constitutional: Alert and oriented. Well appearing and in no acute distress.  Clinically intoxicated. Eyes: Conjunctivae are normal. PERRL. EOMI. Head: Atraumatic. Nose: No congestion/rhinnorhea. Mouth/Throat: Mucous membranes are moist.  Oropharynx non-erythematous. Neck: No stridor. No cervical spine tenderness to palpation. Cardiovascular: Normal rate, regular rhythm. Grossly normal heart sounds.  Good peripheral circulation. Respiratory: Normal respiratory effort.  No retractions. Lungs CTAB. Gastrointestinal: Soft , nondistended, nontender to palpation. No CVA tenderness. Musculoskeletal: No lower extremity tenderness nor edema.  No joint effusions. No signs of acute trauma. Neurologic:  Normal speech and language. No gross focal neurologic deficits are appreciated. No gait instability noted. Cranial nerves II through XII intact 5/5 strength and sensation in all 4 extremities Skin:  Skin is warm, dry and intact. No rash noted. Psychiatric: Flat affect, but with linear thought processes.  ____________________________________________   LABS (all labs ordered are listed, but only abnormal results are displayed)  Labs Reviewed  COMPREHENSIVE METABOLIC PANEL - Abnormal; Notable for the following components:      Result Value   Potassium 3.4 (*)    Glucose, Bld 169 (*)    Calcium 8.8 (*)    All other components within normal limits  ETHANOL - Abnormal; Notable for the following components:   Alcohol, Ethyl (B) 181 (*)    All other components within normal limits   SALICYLATE LEVEL - Abnormal; Notable for the following components:   Salicylate Lvl <8.8 (*)    All other components within normal limits  ACETAMINOPHEN LEVEL - Abnormal; Notable for the following components:   Acetaminophen (Tylenol), Serum <10 (*)    All other components within normal limits  CBC - Abnormal; Notable for the following components:   RBC 4.16 (*)    Hemoglobin 11.9 (*)    HCT 35.7 (*)    All other components within normal limits  URINE DRUG SCREEN, QUALITATIVE ([facility] ONLY)  TROPONIN I (HIGH SENSITIVITY)  TROPONIN I (HIGH SENSITIVITY)   ____________________________________________  12 Lead EKG  Sinus rhythm, rate of 74 bpm.  Normal axis.  Normal intervals.  No evidence of acute ischemia.  Couple PACs. ____________________________________________  RADIOLOGY  ED MD interpretation: CT head reviewed by me without evidence of acute intracranial pathology. CT C-spine reviewed and without evidence of acute fracture  Official radiology report(s): CT Head Wo Contrast  Result Date: 03/06/2020 CLINICAL DATA:  Status post fall.  Syncope.  Depression. EXAM: CT HEAD WITHOUT CONTRAST CT CERVICAL SPINE WITHOUT CONTRAST TECHNIQUE: Multidetector CT imaging of the head and cervical spine was performed following the standard protocol without intravenous contrast. Multiplanar CT image reconstructions of the cervical spine were also generated. COMPARISON:  CT head 01/29/2013. FINDINGS: CT HEAD FINDINGS Brain: Patchy and confluent areas  of decreased attenuation are noted throughout the deep and periventricular white matter of the cerebral hemispheres bilaterally, compatible with chronic microvascular ischemic disease. No evidence of large-territorial acute infarction. No parenchymal hemorrhage. No mass lesion. No extra-axial collection. No mass effect or midline shift. No hydrocephalus. Basilar cisterns are patent. Vascular: No hyperdense vessel. Atherosclerotic calcifications are present  within the cavernous internal carotid arteries. Skull: No acute fracture or focal lesion. Sinuses/Orbits: Paranasal sinuses and mastoid air cells are clear. The orbits are unremarkable. Other: None. CT CERVICAL SPINE FINDINGS Alignment: Slight reversal normal cervical lordosis likely due to positioning. Skull base and vertebrae: Multilevel degenerative changes of the spine. No acute fracture. No aggressive appearing focal osseous lesion or focal pathologic process. Soft tissues and spinal canal: No prevertebral fluid or swelling. No visible canal hematoma. Disc levels:  Maintained. Upper chest: Unremarkable. Other: At least mild to moderate carotid artery calcifications within the neck. IMPRESSION: 1. No acute intracranial abnormality. 2. No acute displaced fracture or traumatic listhesis of the cervical spine. Electronically Signed   By: Iven Finn M.D.   On: 03/06/2020 21:58   CT Cervical Spine Wo Contrast  Result Date: 03/06/2020 CLINICAL DATA:  Status post fall.  Syncope.  Depression. EXAM: CT HEAD WITHOUT CONTRAST CT CERVICAL SPINE WITHOUT CONTRAST TECHNIQUE: Multidetector CT imaging of the head and cervical spine was performed following the standard protocol without intravenous contrast. Multiplanar CT image reconstructions of the cervical spine were also generated. COMPARISON:  CT head 01/29/2013. FINDINGS: CT HEAD FINDINGS Brain: Patchy and confluent areas of decreased attenuation are noted throughout the deep and periventricular white matter of the cerebral hemispheres bilaterally, compatible with chronic microvascular ischemic disease. No evidence of large-territorial acute infarction. No parenchymal hemorrhage. No mass lesion. No extra-axial collection. No mass effect or midline shift. No hydrocephalus. Basilar cisterns are patent. Vascular: No hyperdense vessel. Atherosclerotic calcifications are present within the cavernous internal carotid arteries. Skull: No acute fracture or focal lesion.  Sinuses/Orbits: Paranasal sinuses and mastoid air cells are clear. The orbits are unremarkable. Other: None. CT CERVICAL SPINE FINDINGS Alignment: Slight reversal normal cervical lordosis likely due to positioning. Skull base and vertebrae: Multilevel degenerative changes of the spine. No acute fracture. No aggressive appearing focal osseous lesion or focal pathologic process. Soft tissues and spinal canal: No prevertebral fluid or swelling. No visible canal hematoma. Disc levels:  Maintained. Upper chest: Unremarkable. Other: At least mild to moderate carotid artery calcifications within the neck. IMPRESSION: 1. No acute intracranial abnormality. 2. No acute displaced fracture or traumatic listhesis of the cervical spine. Electronically Signed   By: Iven Finn M.D.   On: 03/06/2020 21:58    ____________________________________________   PROCEDURES and INTERVENTIONS  Procedure(s) performed (including Critical Care):  .1-3 Lead EKG Interpretation Performed by: Vladimir Crofts, MD Authorized by: Vladimir Crofts, MD     Interpretation: normal     ECG rate:  76   ECG rate assessment: normal     Rhythm: sinus rhythm     Ectopy: none     Conduction: normal      Medications - No data to display  ____________________________________________   MDM / ED COURSE   70 year old male presents to the ED after syncopal episode that sounds vasovagal in etiology, without evidence of acute medical pathology, amenable to TTS evaluation for his depression and likely outpatient management.  Normal vitals on room air.  Exam with a flat affect, but with linear thought processes, no distress or evidence of neurovascular deficit.  No  significant trauma from his fall/syncope.  Blood work with alcohol intoxication, but no further evidence of acute pathology.  CT head without ICH or signs of CVA.  EKG is nonischemic with appropriate intervals without evidence of cardiac etiology of syncope.  Patient is not suicidal,  but I think would benefit from a conversation with TTS at the very least to discuss outpatient management for his depression and to set up counseling.  Patient signed out to oncoming provider to facilitate this.  Anticipate outpatient management thereafter.  TTS, to evaluate the patient, and patient now is denying request for resources.  I reevaluate him he continues to deny suicidality.  No evidence of pathology to preclude outpatient management.  No evidence of psychiatric emergencies.  We thoroughly discussed return precautions for the ED.  Patient is clinically sober and has capacity to make this decision upon the time of my reassessment and discharge.   Clinical Course as of 03/06/20 2249  Fri Mar 06, 2020  2222 Spoke with TTS, who can see the patient soon.  I discussed my low suspicion for suicidality or need for IVC. [DS]  2244 TTS has seen the patient and is denying depression or desire for counseling.  [DS]  2245 I reassessed the patient and he continues to deny suicidality.  We discussed outpatient management and following up with [facility] for counseling.  We discussed return precautions for the ED. [DS]    Clinical Course User Index [DS] Vladimir Crofts, MD    ____________________________________________   FINAL CLINICAL IMPRESSION(S) / ED DIAGNOSES  Final diagnoses:  Other depression  Syncope, unspecified syncope type     ED Discharge Orders    None       Rahima Fleishman Tamala Julian   Note:  This document was prepared using Dragon voice recognition software and may include unintentional dictation errors.   Vladimir Crofts, MD 03/06/20 2353    Vladimir Crofts, MD 03/06/20 2250

## 2020-03-06 NOTE — ED Triage Notes (Signed)
Pt with syncope tonight per ems and depression. Pt denies SI but states is depressed and needs to speak with someone. Per ems pt stated "he didn't want to be here anymore". Pt with ETOH consumption tonight. Pt unsure if he hit his head during syncopal episode.

## 2020-03-06 NOTE — ED Notes (Signed)
Hourly rounding reveals patient in room. No complaints, stable, in no acute distress. Q15 minute rounds and monitoring via Rover and Officer to continue.   

## 2020-03-06 NOTE — ED Notes (Signed)
Pt was sleeping in lobby, found after being called 3 times

## 2020-03-06 NOTE — ED Notes (Signed)
EDP, Gurpreet Mariani to bedside.

## 2020-03-06 NOTE — ED Notes (Addendum)
Patient transported to CT 

## 2020-03-06 NOTE — Discharge Instructions (Signed)
I would strongly recommend that you call [facility] to set up counseling.  If you develop any worsening depression, thoughts of hurting herself or suicide, please return to the ED. If you develop any further episodes of passing out, please return to the ED.

## 2020-03-06 NOTE — ED Notes (Signed)
Patient is calm and cooperative. He is discharged to home via self. Patient given discharge instruction and reviewed follow up care. Patient belongings are given. No issues.

## 2020-10-23 ENCOUNTER — Encounter: Payer: Self-pay | Admitting: Emergency Medicine

## 2020-10-23 ENCOUNTER — Emergency Department: Payer: Medicare Other

## 2020-10-23 ENCOUNTER — Other Ambulatory Visit: Payer: Self-pay

## 2020-10-23 ENCOUNTER — Emergency Department
Admission: EM | Admit: 2020-10-23 | Discharge: 2020-10-23 | Disposition: A | Discharge status: Home / Self Care | Payer: Medicare Other | Attending: Emergency Medicine | Admitting: Emergency Medicine

## 2020-10-23 DIAGNOSIS — Y92017 Garden or yard in single-family (private) house as the place of occurrence of the external cause: Secondary | ICD-10-CM | POA: Insufficient documentation

## 2020-10-23 DIAGNOSIS — Z79899 Other long term (current) drug therapy: Secondary | ICD-10-CM | POA: Diagnosis not present

## 2020-10-23 DIAGNOSIS — X501XXA Overexertion from prolonged static or awkward postures, initial encounter: Secondary | ICD-10-CM | POA: Diagnosis not present

## 2020-10-23 DIAGNOSIS — Z794 Long term (current) use of insulin: Secondary | ICD-10-CM | POA: Diagnosis not present

## 2020-10-23 DIAGNOSIS — I10 Essential (primary) hypertension: Secondary | ICD-10-CM | POA: Diagnosis not present

## 2020-10-23 DIAGNOSIS — Z7982 Long term (current) use of aspirin: Secondary | ICD-10-CM | POA: Diagnosis not present

## 2020-10-23 DIAGNOSIS — E7800 Pure hypercholesterolemia, unspecified: Secondary | ICD-10-CM | POA: Insufficient documentation

## 2020-10-23 DIAGNOSIS — S46012A Strain of muscle(s) and tendon(s) of the rotator cuff of left shoulder, initial encounter: Secondary | ICD-10-CM | POA: Diagnosis not present

## 2020-10-23 DIAGNOSIS — E1169 Type 2 diabetes mellitus with other specified complication: Secondary | ICD-10-CM | POA: Diagnosis not present

## 2020-10-23 DIAGNOSIS — Y93H2 Activity, gardening and landscaping: Secondary | ICD-10-CM | POA: Diagnosis not present

## 2020-10-23 DIAGNOSIS — Z87891 Personal history of nicotine dependence: Secondary | ICD-10-CM | POA: Diagnosis not present

## 2020-10-23 DIAGNOSIS — M25512 Pain in left shoulder: Secondary | ICD-10-CM | POA: Diagnosis not present

## 2020-10-23 DIAGNOSIS — S4992XA Unspecified injury of left shoulder and upper arm, initial encounter: Secondary | ICD-10-CM | POA: Diagnosis present

## 2020-10-23 MED ORDER — TRAMADOL HCL 50 MG PO TABS: 50.0000 mg | ORAL_TABLET | Freq: Four times a day (QID) | ORAL | 0 refills | Status: DC | PRN

## 2020-10-23 MED ORDER — LIDOCAINE 5 % EX PTCH: 1.0000 | MEDICATED_PATCH | Freq: Two times a day (BID) | CUTANEOUS | 0 refills | Status: AC

## 2020-10-23 MED ORDER — MELOXICAM 15 MG PO TABS: 15.0000 mg | ORAL_TABLET | Freq: Every day | ORAL | 0 refills | Status: AC

## 2020-10-23 MED ORDER — LIDOCAINE 5 % EX PTCH
1.0000 | MEDICATED_PATCH | Freq: Once | CUTANEOUS | Status: DC
  Administered 2020-10-23: 1 via TRANSDERMAL
  Filled 2020-10-23: qty 1

## 2020-10-23 MED ORDER — DEXAMETHASONE SODIUM PHOSPHATE 10 MG/ML IJ SOLN
10.0000 mg | Freq: Once | INTRAMUSCULAR | Status: AC
  Administered 2020-10-23: 10 mg via INTRAVENOUS
  Filled 2020-10-23: qty 1

## 2020-10-23 MED ORDER — ORPHENADRINE CITRATE 30 MG/ML IJ SOLN
60.0000 mg | Freq: Once | INTRAMUSCULAR | Status: AC
  Administered 2020-10-23: 60 mg via INTRAVENOUS
  Filled 2020-10-23: qty 2

## 2020-10-23 MED ORDER — BACLOFEN 10 MG PO TABS: 10.0000 mg | ORAL_TABLET | Freq: Three times a day (TID) | ORAL | 0 refills | Status: AC

## 2020-10-23 NOTE — ED Provider Notes (Signed)
[facility] Emergency Department Provider Note  ____________________________________________   Event Date/Time   First MD Initiated Contact with Patient 10/23/20 [phone]     (approximate)  I have reviewed the triage vital signs and the nursing notes.   HISTORY  Chief Complaint Arm Pain    HPI Randy [name] is a 70 y.o. male presents emergency department with left shoulder pain after weed eating and mowing his yard yesterday.  Patient states he cannot move the left arm without pain.  He denies any chest pain or shortness of breath.  He denies any numbness or tingling.  Symptoms started overnight.  Past Medical History:  Diagnosis Date   Diabetes mellitus without complication ([place])    Hypercholesteremia    Hypertension    Sleep apnea     Patient Active Problem List   Diagnosis Date Noted   Diastasis recti 09/12/2019   Abdominal wall pain in epigastric region 09/12/2019   Acquired trigger finger 08/07/2018   Osteoarthritis of ankle 08/07/2018    Past Surgical History:  Procedure Laterality Date   HERNIA REPAIR      Prior to Admission medications   Medication Sig Start Date End Date Taking? Authorizing Provider  baclofen (LIORESAL) 10 MG tablet Take 1 tablet (10 mg total) by mouth 3 (three) times daily for 7 days. 10/23/20 10/30/20 Yes Zollie Clemence, Linden Dolin, PA-C  lidocaine (LIDODERM) 5 % Place 1 patch onto the skin every 12 (twelve) hours. Remove & Discard patch within 12 hours or as directed by MD 10/23/20 10/23/21 Yes Fabion Gatson, Linden Dolin, PA-C  meloxicam (MOBIC) 15 MG tablet Take 1 tablet (15 mg total) by mouth daily. 10/23/20 10/23/21 Yes Tynslee Bowlds, Linden Dolin, PA-C  traMADol (ULTRAM) 50 MG tablet Take 1 tablet (50 mg total) by mouth every 6 (six) hours as needed. 10/23/20  Yes Berwyn Bigley, Linden Dolin, PA-C  albuterol (VENTOLIN HFA) 108 (90 Base) MCG/ACT inhaler Inhale into the lungs. 09/09/19   [provider]  allopurinol (ZYLOPRIM) 300 MG tablet Take 300 mg by  mouth daily. Patient not taking: Reported on 11/13/2019 08/17/19   [provider]  aspirin 81 MG tablet Take 81 mg by mouth daily.    [provider]  atorvastatin (LIPITOR) 40 MG tablet Take 40 mg by mouth daily.    [provider]  finasteride (PROSCAR) 5 MG tablet TAKE 1 TABLET BY MOUTH DAILY FOR ENLARGED PROSTATE ALONG WITH TAMSULOSIN 08/20/18   [provider]  fluticasone (FLONASE) 50 MCG/ACT nasal spray Place 2 sprays into both nostrils daily. 09/05/19   [provider]  insulin glargine (LANTUS) 100 UNIT/ML injection Inject 50 Units into the skin 2 (two) times daily.    [provider]  insulin lispro (HUMALOG) 100 UNIT/ML injection Inject 30 Units into the skin 3 (three) times daily with meals.    [provider]  lisinopril (PRINIVIL,ZESTRIL) 10 MG tablet Take 10 mg by mouth daily.    [provider]  tamsulosin (FLOMAX) 0.4 MG CAPS capsule TAKE 1 CAPSULE BY MOUTH EVERY DAY 30 MINUTES AFTER THE SAME MEAL 08/20/18   [provider]    Allergies Patient has no known allergies.  Family History  Problem Relation Age of Onset   Prostate cancer Neg Hx    Bladder Cancer Neg Hx    Kidney cancer Neg Hx     Social History Social History   Tobacco Use   Smoking status: Former    Types: Cigarettes   Smokeless tobacco: Never  Vaping Use   Vaping Use: Never used  Substance Use Topics   Alcohol use: Yes    Comment: infreqently   Drug use: No    Review of Systems  Constitutional: No fever/chills Eyes: No visual changes. ENT: No sore throat. Respiratory: Denies cough Cardiovascular: Denies chest pain Gastrointestinal: Denies abdominal pain Genitourinary: Negative for dysuria. Musculoskeletal: Negative for back pain.  Positive for left shoulder pain Skin: Negative for rash. Psychiatric: no mood changes,     ____________________________________________   PHYSICAL EXAM:  VITAL SIGNS: ED Triage  Vitals  Enc Vitals Group     BP 10/23/20 0804 (!) 141/62     Pulse Rate 10/23/20 0804 94     Resp 10/23/20 0804 16     Temp 10/23/20 0804 98.7 F (37.1 C)     Temp Source 10/23/20 0804 Oral     SpO2 10/23/20 0804 97 %     Weight 10/23/20 0802 218 lb 0.6 oz (98.9 kg)     Height 10/23/20 0802 '5\' 6"'$  (1.676 m)     Head Circumference --      Peak Flow --      Pain Score 10/23/20 0802 10     Pain Loc --      Pain Edu? --      Excl. in [place]? --     Constitutional: Alert and oriented. Well appearing and in no acute distress. Eyes: Conjunctivae are normal.  Head: Atraumatic. Nose: No congestion/rhinnorhea. Mouth/Throat: Mucous membranes are moist.   Neck:  supple no lymphadenopathy noted Cardiovascular: Normal rate, regular rhythm. Heart sounds are normal GU: deferred Musculoskeletal: Decreased range of motion of the left shoulder, left shoulder is tender at the joint and along the bicep tendon, pain is reproduced with movement, patient cannot internally or externally rotate, neurovascular is intact  neurologic:  Normal speech and language.  Skin:  Skin is warm, dry and intact. No rash noted. Psychiatric: Mood and affect are normal. Speech and behavior are normal.  ____________________________________________   LABS (all labs ordered are listed, but only abnormal results are displayed)  Labs Reviewed - No data to display ____________________________________________   ____________________________________________  RADIOLOGY  X-ray of the left shoulder  ____________________________________________   PROCEDURES  Procedure(s) performed: Sling applied   Procedures    ____________________________________________   INITIAL IMPRESSION / ASSESSMENT AND PLAN / ED COURSE  Pertinent labs & imaging results that were available during my care of the patient were reviewed by me and considered in my medical decision making (see chart for details).   The patient is a 70 year old  male presents emergency department with left shoulder pain due to a mechanical injury.  See HPI physical exam shows patient to have limited range of motion of the left shoulder.  Concerns for rotator cuff injury.  X-ray of the left shoulder reviewed by me confirmed by radiology to be negative for acute bony abnormality.  No effusion noted.  Patient was placed in sling.  Given Lidoderm patch to left shoulder.  Given Norflex to relax the muscle.  Patient has had aspirin twice this morning as EMS gave him a chest pain protocol.  We will add a steroid to reduce inflammation for the patient.  Patient was placed in a sling.  Stating that he feels better and the pain has decreased.  He was given prescriptions for anti-inflammatories, muscle relaxers, Lidoderm patch, and pain medication.  He is to follow-up with orthopedics if not improving in 3 to 4 days.  Return if  worsening.  He was discharged stable condition.    Randy [name] was evaluated in Emergency Department on 10/23/2020 for the symptoms described in the history of present illness. He was evaluated in the context of the global COVID-19 pandemic, which necessitated consideration that the patient might be at risk for infection with the SARS-CoV-2 virus that causes COVID-19. Institutional protocols and algorithms that pertain to the evaluation of patients at risk for COVID-19 are in a state of rapid change based on information released by regulatory bodies including the CDC and federal and state organizations. These policies and algorithms were followed during the patient's care in the ED.    As part of my medical decision making, I reviewed the following data within the [place] notes reviewed and incorporated, Old chart reviewed, Radiograph reviewed , Notes from prior ED visits, and [place] Controlled Substance Database  ____________________________________________   FINAL CLINICAL IMPRESSION(S) / ED DIAGNOSES  Final  diagnoses:  Rotator cuff strain, left, initial encounter      NEW MEDICATIONS STARTED DURING THIS VISIT:  New Prescriptions   BACLOFEN (LIORESAL) 10 MG TABLET    Take 1 tablet (10 mg total) by mouth 3 (three) times daily for 7 days.   LIDOCAINE (LIDODERM) 5 %    Place 1 patch onto the skin every 12 (twelve) hours. Remove & Discard patch within 12 hours or as directed by MD   MELOXICAM (MOBIC) 15 MG TABLET    Take 1 tablet (15 mg total) by mouth daily.   TRAMADOL (ULTRAM) 50 MG TABLET    Take 1 tablet (50 mg total) by mouth every 6 (six) hours as needed.     Note:  This document was prepared using Dragon voice recognition software and may include unintentional dictation errors.    Versie Starks, PA-C 10/23/20 [id number]    Lavonia Drafts, MD 10/23/20 [phone]

## 2020-10-23 NOTE — ED Triage Notes (Signed)
Pt comes into the ED via EMS from home with c/o left shoulder and arm pain since mowing and weed eating yesterday  136/66 Nitro X1 SL #[id number] ASA '324mg'$  [id number]

## 2020-10-23 NOTE — Discharge Instructions (Signed)
Follow up with [facility], please call for an appointment They also have a walk in clinic if you feel you need to be seen right away Return to the ER if worsening or if you have chest pain/shortness of breat Apply ice to the left shoulder Take your medications as prescribed Change the lidoderm patch every 24 hours

## 2020-11-10 ENCOUNTER — Other Ambulatory Visit: Payer: Self-pay

## 2020-11-10 ENCOUNTER — Other Ambulatory Visit: Payer: Medicare Other

## 2020-11-10 DIAGNOSIS — N138 Other obstructive and reflux uropathy: Secondary | ICD-10-CM

## 2020-11-10 DIAGNOSIS — N401 Enlarged prostate with lower urinary tract symptoms: Secondary | ICD-10-CM

## 2020-11-11 ENCOUNTER — Emergency Department: Payer: Medicare Other

## 2020-11-11 ENCOUNTER — Encounter: Payer: Self-pay | Admitting: Emergency Medicine

## 2020-11-11 ENCOUNTER — Emergency Department
Admission: EM | Admit: 2020-11-11 | Discharge: 2020-11-11 | Disposition: A | Discharge status: Home / Self Care | Payer: Medicare Other | Attending: Emergency Medicine | Admitting: Emergency Medicine

## 2020-11-11 DIAGNOSIS — R059 Cough, unspecified: Secondary | ICD-10-CM | POA: Diagnosis not present

## 2020-11-11 DIAGNOSIS — Z794 Long term (current) use of insulin: Secondary | ICD-10-CM | POA: Diagnosis not present

## 2020-11-11 DIAGNOSIS — Z7982 Long term (current) use of aspirin: Secondary | ICD-10-CM | POA: Insufficient documentation

## 2020-11-11 DIAGNOSIS — Z79899 Other long term (current) drug therapy: Secondary | ICD-10-CM | POA: Insufficient documentation

## 2020-11-11 DIAGNOSIS — Z87891 Personal history of nicotine dependence: Secondary | ICD-10-CM | POA: Diagnosis not present

## 2020-11-11 DIAGNOSIS — R109 Unspecified abdominal pain: Secondary | ICD-10-CM | POA: Diagnosis present

## 2020-11-11 DIAGNOSIS — I10 Essential (primary) hypertension: Secondary | ICD-10-CM | POA: Insufficient documentation

## 2020-11-11 DIAGNOSIS — K8500 Idiopathic acute pancreatitis without necrosis or infection: Secondary | ICD-10-CM | POA: Diagnosis not present

## 2020-11-11 DIAGNOSIS — E119 Type 2 diabetes mellitus without complications: Secondary | ICD-10-CM | POA: Diagnosis not present

## 2020-11-11 LAB — LIPASE, BLOOD: Lipase: 163 U/L — ABNORMAL HIGH (ref 11–51)

## 2020-11-11 LAB — CBC WITH DIFFERENTIAL/PLATELET
Abs Immature Granulocytes: 0.04 10*3/uL (ref 0.00–0.07)
Basophils Absolute: 0 10*3/uL (ref 0.0–0.1)
Basophils Relative: 1 %
Eosinophils Absolute: 0.3 10*3/uL (ref 0.0–0.5)
Eosinophils Relative: 4 %
HCT: 35.1 % — ABNORMAL LOW (ref 39.0–52.0)
Hemoglobin: 12.3 g/dL — ABNORMAL LOW (ref 13.0–17.0)
Immature Granulocytes: 1 %
Lymphocytes Relative: 26 %
Lymphs Abs: 2 10*3/uL (ref 0.7–4.0)
MCH: 28.7 pg (ref 26.0–34.0)
MCHC: 35 g/dL (ref 30.0–36.0)
MCV: 82 fL (ref 80.0–100.0)
Monocytes Absolute: 0.7 10*3/uL (ref 0.1–1.0)
Monocytes Relative: 9 %
Neutro Abs: 4.7 10*3/uL (ref 1.7–7.7)
Neutrophils Relative %: 59 %
Platelets: 320 10*3/uL (ref 150–400)
RBC: 4.28 MIL/uL (ref 4.22–5.81)
RDW: 16 % — ABNORMAL HIGH (ref 11.5–15.5)
WBC: 7.8 10*3/uL (ref 4.0–10.5)
nRBC: 0 % (ref 0.0–0.2)

## 2020-11-11 LAB — COMPREHENSIVE METABOLIC PANEL
ALT: 15 U/L (ref 0–44)
AST: 27 U/L (ref 15–41)
Albumin: 3.4 g/dL — ABNORMAL LOW (ref 3.5–5.0)
Alkaline Phosphatase: 56 U/L (ref 38–126)
Anion gap: 11 (ref 5–15)
BUN: 9 mg/dL (ref 8–23)
CO2: 25 mmol/L (ref 22–32)
Calcium: 9 mg/dL (ref 8.9–10.3)
Chloride: 99 mmol/L (ref 98–111)
Creatinine, Ser: 0.96 mg/dL (ref 0.61–1.24)
GFR, Estimated: >60 mL/min (ref >60)
Glucose, Bld: 155 mg/dL — ABNORMAL HIGH (ref 70–99)
Potassium: 3.1 mmol/L — ABNORMAL LOW (ref 3.5–5.1)
Sodium: 135 mmol/L (ref 135–145)
Total Bilirubin: 0.6 mg/dL (ref 0.3–1.2)
Total Protein: 7 g/dL (ref 6.5–8.1)

## 2020-11-11 LAB — TRIGLYCERIDES: Triglycerides: 180 mg/dL — ABNORMAL HIGH (ref <150)

## 2020-11-11 LAB — LIPID PANEL
Cholesterol: 111 mg/dL (ref 0–200)
HDL: 58 mg/dL (ref >40)
LDL Cholesterol: 18 mg/dL (ref 0–99)
Total CHOL/HDL Ratio: 1.9 RATIO
Triglycerides: 176 mg/dL — ABNORMAL HIGH (ref <150)
VLDL: 35 mg/dL (ref 0–40)

## 2020-11-11 LAB — PSA: Prostate Specific Ag, Serum: 1.5 ng/mL (ref 0.0–4.0)

## 2020-11-11 MED ORDER — ONDANSETRON 4 MG PO TBDP: 4.0000 mg | ORAL_TABLET | Freq: Three times a day (TID) | ORAL | 0 refills | Status: DC | PRN

## 2020-11-11 MED ORDER — ONDANSETRON HCL 4 MG/2ML IJ SOLN
4.0000 mg | Freq: Once | INTRAMUSCULAR | Status: AC
  Administered 2020-11-11: 4 mg via INTRAVENOUS
  Filled 2020-11-11: qty 2

## 2020-11-11 MED ORDER — MORPHINE SULFATE (PF) 4 MG/ML IV SOLN
4.0000 mg | Freq: Once | INTRAVENOUS | Status: AC
  Administered 2020-11-11: 4 mg via INTRAVENOUS
  Filled 2020-11-11: qty 1

## 2020-11-11 MED ORDER — IOHEXOL 350 MG/ML SOLN
100.0000 mL | Freq: Once | INTRAVENOUS | Status: AC | PRN
  Administered 2020-11-11: 100 mL via INTRAVENOUS

## 2020-11-11 MED ORDER — OXYCODONE-ACETAMINOPHEN 5-325 MG PO TABS: 1.0000 | ORAL_TABLET | ORAL | 0 refills | Status: AC | PRN

## 2020-11-11 NOTE — Discharge Instructions (Addendum)
You have mild pancreatitis.  Please be sure to return for increasing pain, fever or vomiting.  I will give you some Percocets 1 pill 4 times a day as needed for pain.  Do not take the Percocet with the tramadol or Ultram if you have any left.  That can be too much pain medicine for you.  I will give you some Zofran melt on your tongue wafers 1 wafer 3 times a day if needed for nausea.  But if the pain is getting worse or you cannot keep down fluids or vomit more than 2 or 3 times in a row please return.  For today eat only clear liquids.  This includes chicken broth flat soda weak tea Jell-O or popsicles.  Tomorrow you can try very bland diet like the peak.  R.  A.  T.  Diet which is bananas rice applesauce and toast.  You can also have crackers and continue the clear liquids.  If you tolerate that gradually add in other foods over the next couple days until you are eating a regular diet.  Avoid fatty foods for the next several days.  Please follow-up with your regular Dr. Princella Ion sometime in the next few days.  Let them know that I have checked your cholesterol and triglycerides.  They should be out of access that information from the hospital computer system.

## 2020-11-11 NOTE — ED Notes (Signed)
Patient drinking oral contrast. Urinal at bedside. Call light within reach. No needs expressed at this time.

## 2020-11-11 NOTE — Progress Notes (Incomplete)
11/11/20 9:58 AM   [name] 06-Feb-1951 [id number]  Referring provider:  Ellamae Sia, MD [address] [place],  [place] [address] No chief complaint on file.   Urological history  BPH with LUTS  - IPSS *** - PVR *** - Managed on Tamsulosin 0.4 mg and Finasteride 5 mg daily.    HPI: Randy [name] is a 70 y.o.male with a history of BPH with LUTS and nocturia, who presents today for follow-up on PSA, IPSS, and exam.    He reports today *** He denies ***  IPSS ***   PMH: Past Medical History:  Diagnosis Date   Diabetes mellitus without complication ([place])    Hypercholesteremia    Hypertension    Sleep apnea     Surgical History: Past Surgical History:  Procedure Laterality Date   HERNIA REPAIR      Home Medications:  Allergies as of 11/12/2020   No Known Allergies      Medication List        Accurate as of November 11, 2020  9:58 AM. If you have any questions, ask your nurse or doctor.          albuterol 108 (90 Base) MCG/ACT inhaler Commonly known as: VENTOLIN HFA Inhale into the lungs.   allopurinol 300 MG tablet Commonly known as: ZYLOPRIM Take 300 mg by mouth daily.   aspirin 81 MG tablet Take 81 mg by mouth daily.   atorvastatin 40 MG tablet Commonly known as: LIPITOR Take 40 mg by mouth daily.   finasteride 5 MG tablet Commonly known as: PROSCAR TAKE 1 TABLET BY MOUTH DAILY FOR ENLARGED PROSTATE ALONG WITH TAMSULOSIN   fluticasone 50 MCG/ACT nasal spray Commonly known as: FLONASE Place 2 sprays into both nostrils daily.   insulin glargine 100 UNIT/ML injection Commonly known as: LANTUS Inject 50 Units into the skin 2 (two) times daily.   insulin lispro 100 UNIT/ML injection Commonly known as: HUMALOG Inject 30 Units into the skin 3 (three) times daily with meals.   lidocaine 5 % Commonly known as: Lidoderm Place 1 patch onto the skin every 12 (twelve) hours. Remove & Discard patch within 12 hours or as  directed by MD   lisinopril 10 MG tablet Commonly known as: ZESTRIL Take 10 mg by mouth daily.   meloxicam 15 MG tablet Commonly known as: MOBIC Take 1 tablet (15 mg total) by mouth daily.   tamsulosin 0.4 MG Caps capsule Commonly known as: FLOMAX TAKE 1 CAPSULE BY MOUTH EVERY DAY 30 MINUTES AFTER THE SAME MEAL   traMADol 50 MG tablet Commonly known as: ULTRAM Take 1 tablet (50 mg total) by mouth every 6 (six) hours as needed.        Allergies:  No Known Allergies  Family History: Family History  Problem Relation Age of Onset   Prostate cancer Neg Hx    Bladder Cancer Neg Hx    Kidney cancer Neg Hx     Social History:  reports that he has quit smoking. His smoking use included cigarettes. He has never used smokeless tobacco. He reports current alcohol use. He reports that he does not use drugs.   Physical Exam: There were no vitals taken for this visit.  Constitutional:  Alert and oriented, No acute distress. HEENT: [place] AT, moist mucus membranes.  Trachea midline, no masses. Cardiovascular: No clubbing, cyanosis, or edema. Respiratory: Normal respiratory effort, no increased work of breathing. GI: Abdomen is soft, nontender, nondistended, no abdominal masses  GU: No CVA tenderness Rectal: Normal sphincter tone,  ***  CC prostate, smooth no nodules Lymph: No cervical or inguinal lymphadenopathy. Skin: No rashes, bruises or suspicious lesions. Neurologic: Grossly intact, no focal deficits, moving all 4 extremities. Psychiatric: Normal mood and affect.  Laboratory Data:  Lab Results  Component Value Date   CREATININE 0.96 11/11/2020   Component     Latest Ref Rng & Units 11/11/2020  WBC     4.0 - 10.5 K/uL 7.8  RBC     4.22 - 5.81 MIL/uL 4.28  Hemoglobin     13.0 - 17.0 g/dL 12.3 (L)  HCT     39.0 - 52.0 % 35.1 (L)  MCV     80.0 - 100.0 fL 82.0  MCH     26.0 - 34.0 pg 28.7  MCHC     30.0 - 36.0 g/dL 35.0  RDW     11.5 - 15.5 % 16.0 (H)  Platelets      150 - 400 K/uL 320  nRBC     0.0 - 0.2 % 0.0  Neutrophils     % 59  NEUT#     1.7 - 7.7 K/uL 4.7  Lymphocytes     % 26  Lymphocyte #     0.7 - 4.0 K/uL 2.0  Monocytes Relative     % 9  Monocyte #     0.1 - 1.0 K/uL 0.7  Eosinophil     % 4  Eosinophils Absolute     0.0 - 0.5 K/uL 0.3  Basophil     % 1  Basophils Absolute     0.0 - 0.1 K/uL 0.0  Immature Granulocytes     % 1  Abs Immature Granulocytes     0.00 - 0.07 K/uL 0.04  WBC, UA     0 - 5 /hpf   Epithelial Cells (non renal)     0 - 10 /hpf   Bacteria, UA     None seen/Few      Component     Latest Ref Rng & Units 11/11/2020  Sodium     135 - 145 mmol/L 135  Potassium     3.5 - 5.1 mmol/L 3.1 (L)  Chloride     98 - 111 mmol/L 99  CO2     22 - 32 mmol/L 25  Glucose     70 - 99 mg/dL 155 (H)  BUN     8 - 23 mg/dL 9  Creatinine     0.61 - 1.24 mg/dL 0.96  Calcium     8.9 - 10.3 mg/dL 9.0  Total Protein     6.5 - 8.1 g/dL 7.0  Albumin     3.5 - 5.0 g/dL 3.4 (L)  AST     15 - 41 U/L 27  ALT     0 - 44 U/L 15  Alkaline Phosphatase     38 - 126 U/L 56  Total Bilirubin     0.3 - 1.2 mg/dL 0.6  GFR, Estimated     >60 mL/min >60  Anion gap     5 - 15 11   Component Prostate Specific Ag, Serum  Latest Ref Rng & Units 0.0 - 4.0 ng/mL  07/12/2017 1.0  10/01/2018 0.8  11/06/2019 1.1  11/10/2020 1.5   Urinalysis   Pertinent Imaging: PVR***  Assessment & Plan:   BPH with LUTS  Nocturia   No follow-ups on file.  [place]  [address] [place], [place] [address] [phone]  I,Kailey Littlejohn,acting as a scribe for [facility], PA-C.,have documented all relevant documentation on the behalf of SHANNON MCGOWAN, PA-C,as directed by  [facility], PA-C while in the presence of [place], PA-C.

## 2020-11-11 NOTE — ED Provider Notes (Addendum)
[facility] Emergency Department Provider Note   ____________________________________________   Event Date/Time   First MD Initiated Contact with Patient 11/11/20 [id number]     (approximate)  I have reviewed the triage vital signs and the nursing notes.   HISTORY  Chief Complaint Abdominal Pain   HPI Randy [name] is a 70 y.o. male who reports 3 days of left-sided abdominal pain entire left side of the abdomen.  He is not sure what brought it on.  The pain has been getting worse every day.  It is now 9 out of 10 achy.  It is made worse with movement or sitting up.  It is worse with palpation as well.  Patient has no fever.  No change in his baseline occasional cough.  He has no nausea vomiting or diarrhea.     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 Authorizing Provider  albuterol (VENTOLIN HFA) 108 (90 Base) MCG/ACT inhaler Inhale into the lungs. 09/09/19   [provider]  allopurinol (ZYLOPRIM) 300 MG tablet Take 300 mg by mouth daily. Patient not taking: Reported on 11/13/2019 08/17/19   [provider]  aspirin 81 MG tablet Take 81 mg by mouth daily.    [provider]  atorvastatin (LIPITOR) 40 MG tablet Take 40 mg by mouth daily.    [provider]  finasteride (PROSCAR) 5 MG tablet TAKE 1 TABLET BY MOUTH DAILY FOR ENLARGED PROSTATE ALONG WITH TAMSULOSIN 08/20/18   [provider]  fluticasone (FLONASE) 50 MCG/ACT nasal spray Place 2 sprays  into both nostrils daily. 09/05/19   [provider]  insulin glargine (LANTUS) 100 UNIT/ML injection Inject 50 Units into the skin 2 (two) times daily.    [provider]  insulin lispro (HUMALOG) 100 UNIT/ML injection Inject 30 Units into the skin 3 (three) times daily with meals.    [provider]  lidocaine (LIDODERM) 5 % Place 1 patch onto the skin every 12 (twelve) hours. Remove & Discard patch within 12 hours or as directed by MD 10/23/20 10/23/21  Caryn Section, Linden Dolin, PA-C  lisinopril (PRINIVIL,ZESTRIL) 10 MG tablet Take 10 mg by mouth daily.    [provider]  meloxicam (MOBIC) 15 MG tablet Take 1 tablet (15 mg total) by mouth daily. 10/23/20 10/23/21  Fisher, Linden Dolin, PA-C  tamsulosin (FLOMAX) 0.4 MG CAPS capsule TAKE 1 CAPSULE BY MOUTH EVERY DAY 30 MINUTES AFTER THE SAME MEAL 08/20/18   [provider]  traMADol (ULTRAM) 50 MG tablet Take 1 tablet (50 mg total) by mouth every 6 (six) hours as needed. 10/23/20   Versie Starks, PA-C    Allergies Patient has no known allergies.  Family History  Problem Relation Age of Onset   Prostate cancer Neg Hx    Bladder Cancer Neg Hx    Kidney cancer Neg Hx     Social History Social History   Tobacco Use   Smoking status: Former  Types: Cigarettes   Smokeless tobacco: Never  Vaping Use   Vaping Use: Never used  Substance Use Topics   Alcohol use: Yes    Comment: infreqently   Drug use: No    Review of Systems  Constitutional: No fever/chills Eyes: No visual changes. ENT: No sore throat. Cardiovascular: Denies chest pain. Respiratory: Denies shortness of breath. Gastrointestinal:  abdominal pain.  No nausea, no vomiting.  No diarrhea.  No constipation. Genitourinary: Negative for dysuria. Musculoskeletal: Negative for back pain. Skin: Negative for rash. Neurological: Negative for headaches, focal weakness   ____________________________________________   PHYSICAL EXAM:  VITAL  SIGNS: ED Triage Vitals  Enc Vitals Group     BP 11/11/20 0723 (!) 155/69     Pulse Rate 11/11/20 0723 98     Resp 11/11/20 0723 18     Temp 11/11/20 0723 98 F (36.7 C)     Temp Source 11/11/20 0723 Oral     SpO2 11/11/20 0721 100 %     Weight 11/11/20 0724 260 lb 8 oz (118.2 kg)     Height 11/11/20 0724 '5\' 6"'$  (1.676 m)     Head Circumference --      Peak Flow --      Pain Score 11/11/20 0723 9     Pain Loc --      Pain Edu? --      Excl. in [place]? --     Constitutional: Alert and oriented. Well appearing and in no acute distress. Eyes: Conjunctivae are normal. PER EOMI. Head: Atraumatic. Nose: No congestion/rhinnorhea. Mouth/Throat: Mucous membranes are moist.  Oropharynx non-erythematous. Neck: No stridor.   Cardiovascular: Normal rate, regular rhythm. Grossly normal heart sounds.  Good peripheral circulation. Respiratory: Normal respiratory effort.  No retractions. Lungs CTAB. Gastrointestinal: Soft tender to palpation on the left side of the abdomen.  This is from the bottom of the ribs to the top of the pelvic bone.  No distention. No abdominal bruits. No CVA tenderness. Musculoskeletal: No lower extremity tenderness nor edema.   Neurologic:  Normal speech and language. No gross focal neurologic deficits are appreciated. No gait instability. Skin:  Skin is warm, dry and intact. No rash noted.   ____________________________________________   LABS (all labs ordered are listed, but only abnormal results are displayed)  Labs Reviewed  COMPREHENSIVE METABOLIC PANEL - Abnormal; Notable for the following components:      Result Value   Potassium 3.1 (*)    Glucose, Bld 155 (*)    Albumin 3.4 (*)    All other components within normal limits  LIPASE, BLOOD - Abnormal; Notable for the following components:   Lipase 163 (*)    All other components within normal limits  CBC WITH DIFFERENTIAL/PLATELET - Abnormal; Notable for the following components:   Hemoglobin 12.3 (*)     HCT 35.1 (*)    RDW 16.0 (*)    All other components within normal limits  URINALYSIS, COMPLETE (UACMP) WITH MICROSCOPIC   ____________________________________________  EKG EKG read interpreted by me shows normal sinus rhythm rate of 98 normal axis similar to EKG from 826 of this year.  No acute ST-T changes  ____________________________________________  RADIOLOGY Gertha Calkin, personally viewed and evaluated these images (plain radiographs) as part of my medical decision making, as well as reviewing the written report by the radiologist.  ED MD interpretation: CT read by radiology reviewed by me shows only some pancreatitis  Official radiology report(s): [facility]  Result Date: 11/11/2020 CLINICAL DATA:  Abdominal pain for 3 days in the left upper and left lower quadrant. EXAM: CT ABDOMEN AND PELVIS WITH CONTRAST TECHNIQUE: Multidetector CT imaging of the abdomen and pelvis was performed using the standard protocol following bolus administration of intravenous contrast. CONTRAST:  169m OMNIPAQUE IOHEXOL 350 MG/ML SOLN COMPARISON:  None. FINDINGS: Lower chest: Clear lung bases. Hepatobiliary: 1 cm hypodensity in the medial segment of the left hepatic lobe compatible with a cyst. Unremarkable gallbladder. No biliary dilatation. Pancreas: Mild peripancreatic stranding greatest about the tail. No fluid collection. Normal enhancement of the pancreatic parenchyma. No ductal dilatation. Spleen: Unremarkable. Adrenals/Urinary Tract: Unremarkable adrenal glands. No renal calculi or hydronephrosis. Subcentimeter hypodensity in the lower pole of the right kidney, too small to fully characterize. Unremarkable bladder. Stomach/Bowel: The stomach is unremarkable. There is no evidence of bowel obstruction or inflammation. The appendix is unremarkable. Vascular/Lymphatic: Abdominal aortic atherosclerosis without aneurysm. No enlarged lymph nodes. Reproductive: Enlarged prostate.  Other: No ascites or pneumoperitoneum.  Prior ventral hernia repair. Musculoskeletal: No acute osseous abnormality or suspicious osseous lesion. Mild lumbar spondylosis. IMPRESSION: 1. Acute uncomplicated pancreatitis. 2. Aortic Atherosclerosis (ICD10-I70.0). Electronically Signed   By: ALogan BoresM.D.   On: 11/11/2020 10:06    ____________________________________________   PROCEDURES  Procedure(s) performed (including Critical Care):  Procedures   ____________________________________________   INITIAL IMPRESSION / ASSESSMENT AND PLAN / ED COURSE  ----------------------------------------- 10:35 AM on 11/11/2020 ----------------------------------------- Patient reports he occasionally has a couple beers.  He does not have more than 212 ounce beers he says.  He was not drinking heavily before this started.  He has no other episodes of pancreatitis in the past.  I will send try/[facility] cholesterol and see if they are elevated.  He sees CPrincella Ionhe will follow-up with them.  I will give him some pain pills we will try to see if he can make it at home clear liquids today brat diet tomorrow if he tolerates it.  I will use Percocet 514 times a day as needed.              ____________________________________________   FINAL CLINICAL IMPRESSION(S) / ED DIAGNOSES  Final diagnoses:  Idiopathic acute pancreatitis without infection or necrosis     ED Discharge Orders     None        Note:  This document was prepared using Dragon voice recognition software and may include unintentional dictation errors.    MNena Polio MD 11/11/20 1039    MNena Polio MD 11/11/20 1039 On discharge patient chest pain has completely resolved.  He is only had 4 of morphine IV.   MNena Polio MD 11/11/20 1040

## 2020-11-11 NOTE — ED Notes (Signed)
Patient at CT scan.

## 2020-11-11 NOTE — ED Triage Notes (Signed)
Patient to ED via [facility] from home for abd pain x3 days worsening this morning. Patient states it is on the LUQ and LLQ, denies N/V/D. Patient alert and oriented.

## 2020-11-12 ENCOUNTER — Ambulatory Visit: Payer: Self-pay | Admitting: Urology

## 2020-11-17 NOTE — Progress Notes (Signed)
11/18/20 11:03 AM   [name] 02-May-1950 [id number]  Referring provider:  Ellamae Sia, MD [place],  [place] [address]  Urological history  BPH with LUTS  -PSA 1.5 in 10/2020 - IPSS 3/0 - PVR 104 mL - Managed on Tamsulosin 0.4 mg and Finasteride 5 mg daily  2. Nocturia -Risk factors for nocturia: obstructive sleep apnea, hypertension, diabetes and BPH -untreated sleep apnea    HPI: Randy [name] is a 70 y.o.male with a history of BPH with LUTS and nocturia, who presents today for follow-up on PSA, IPSS, and exam.   He has no urinary complaints at this visit.    Patient denies any modifying or aggravating factors.  Patient denies any gross hematuria, dysuria or suprapubic/flank pain.  Patient denies any fevers, chills, nausea or vomiting.       IPSS     Row Name 11/18/20 1000         International Prostate Symptom Score   How often have you had the sensation of not emptying your bladder? Not at All     How often have you had to urinate less than every two hours? Not at All     How often have you found you stopped and started again several times when you urinated? Not at All     How often have you found it difficult to postpone urination? Not at All     How often have you had a weak urinary stream? Not at All     How often have you had to strain to start urination? Not at All     How many times did you typically get up at night to urinate? 3 Times     Total IPSS Score 3           Quality of Life due to urinary symptoms   If you were to spend the rest of your life with your urinary condition just the way it is now how would you feel about that? Delighted              Score:  1-7 Mild 8-19 Moderate 20-35 Severe      PMH: Past Medical History:  Diagnosis Date   Diabetes mellitus without complication ([place])    Hypercholesteremia    Hypertension    Sleep apnea     Surgical History: Past Surgical History:  Procedure  Laterality Date   HERNIA REPAIR      Home Medications:  Allergies as of 11/18/2020   No Known Allergies      Medication List        Accurate as of November 18, 2020 11:03 AM. If you have any questions, ask your nurse or doctor.          STOP taking these medications    traMADol 50 MG tablet Commonly known as: ULTRAM Stopped by: Zara Council, PA-C       TAKE these medications    Accu-Chek Aviva Plus test strip Generic drug: glucose blood   albuterol 108 (90 Base) MCG/ACT inhaler Commonly known as: VENTOLIN HFA Inhale into the lungs.   allopurinol 100 MG tablet Commonly known as: ZYLOPRIM Take 1 tablet by mouth daily. What changed: Another medication with the same name was removed. Continue taking this medication, and follow the directions you see here. Changed by: Zara Council, PA-C   aspirin 81 MG tablet Take 81 mg by mouth daily.   atorvastatin 40 MG tablet Commonly known as:  LIPITOR Take 40 mg by mouth daily.   BD Insulin Syringe U/F 31G X 5/16" 0.5 ML Misc Generic drug: Insulin Syringe-Needle U-100 4 (four) times daily.   finasteride 5 MG tablet Commonly known as: PROSCAR TAKE 1 TABLET BY MOUTH DAILY FOR ENLARGED PROSTATE ALONG WITH TAMSULOSIN   fluticasone 50 MCG/ACT nasal spray Commonly known as: FLONASE Place 2 sprays into both nostrils daily.   insulin glargine 100 UNIT/ML injection Commonly known as: LANTUS Inject 50 Units into the skin 2 (two) times daily.   insulin lispro 100 UNIT/ML injection Commonly known as: HUMALOG Inject 30 Units into the skin 3 (three) times daily with meals.   lidocaine 5 % Commonly known as: Lidoderm Place 1 patch onto the skin every 12 (twelve) hours. Remove & Discard patch within 12 hours or as directed by MD   lisinopril 10 MG tablet Commonly known as: ZESTRIL Take 10 mg by mouth daily.   meloxicam 15 MG tablet Commonly known as: MOBIC Take 1 tablet (15 mg total) by mouth daily.    ondansetron 4 MG disintegrating tablet Commonly known as: Zofran ODT Take 1 tablet (4 mg total) by mouth every 8 (eight) hours as needed for nausea or vomiting.   oxyCODONE-acetaminophen 5-325 MG tablet Commonly known as: Percocet Take 1 tablet by mouth every 4 (four) hours as needed for severe pain.   tamsulosin 0.4 MG Caps capsule Commonly known as: FLOMAX TAKE 1 CAPSULE BY MOUTH EVERY DAY 30 MINUTES AFTER THE SAME MEAL        Allergies:  No Known Allergies  Family History: Family History  Problem Relation Age of Onset   Prostate cancer Neg Hx    Bladder Cancer Neg Hx    Kidney cancer Neg Hx     Social History:  reports that he has quit smoking. His smoking use included cigarettes. He has never used smokeless tobacco. He reports current alcohol use. He reports that he does not use drugs.   Physical Exam: BP (!) 150/81   Pulse 84   Ht 5\' 8"  (1.727 m)   Wt 271 lb (122.9 kg)   BMI 41.21 kg/m   Constitutional:  Well nourished. Alert and oriented, No acute distress. HEENT: [place] AT, mask in place.  Trachea midline Cardiovascular: No clubbing, cyanosis, or edema. Respiratory: Normal respiratory effort, no increased work of breathing. GU: No CVA tenderness.  No bladder fullness or masses.  Patient with uncircumcised phallus.  Foreskin easily retracted  Urethral meatus is patent.  No penile discharge. No penile lesions or rashes. Scrotum without lesions, cysts, rashes and/or edema.  Testicles are located scrotally bilaterally. No masses are appreciated in the testicles. Left and right epididymis are normal. Rectal: Patient with  normal sphincter tone. Anus and perineum without scarring or rashes. No rectal masses are appreciated. Prostate is approximately 50 + grams, could only palpated the apex, no nodules are appreciated. Seminal vesicles could not be palpated Neurologic: Grossly intact, no focal deficits, moving all 4 extremities. Psychiatric: Normal mood and affect.    Laboratory Data: Lab Results  Component Value Date   CREATININE 0.96 11/11/2020   Component     Latest Ref Rng & Units 11/11/2020  WBC     4.0 - 10.5 K/uL 7.8  RBC     4.22 - 5.81 MIL/uL 4.28  Hemoglobin     13.0 - 17.0 g/dL 12.3 (L)  HCT     39.0 - 52.0 % 35.1 (L)  MCV     80.0 -  100.0 fL 82.0  MCH     26.0 - 34.0 pg 28.7  MCHC     30.0 - 36.0 g/dL 35.0  RDW     11.5 - 15.5 % 16.0 (H)  Platelets     150 - 400 K/uL 320  nRBC     0.0 - 0.2 % 0.0  Neutrophils     % 59  NEUT#     1.7 - 7.7 K/uL 4.7  Lymphocytes     % 26  Lymphocyte #     0.7 - 4.0 K/uL 2.0  Monocytes Relative     % 9  Monocyte #     0.1 - 1.0 K/uL 0.7  Eosinophil     % 4  Eosinophils Absolute     0.0 - 0.5 K/uL 0.3  Basophil     % 1  Basophils Absolute     0.0 - 0.1 K/uL 0.0  Immature Granulocytes     % 1  Abs Immature Granulocytes     0.00 - 0.07 K/uL 0.04  WBC, UA     0 - 5 /hpf   Epithelial Cells (non renal)     0 - 10 /hpf   Bacteria, UA     None seen/Few      Component     Latest Ref Rng & Units 11/11/2020  Sodium     135 - 145 mmol/L 135  Potassium     3.5 - 5.1 mmol/L 3.1 (L)  Chloride     98 - 111 mmol/L 99  CO2     22 - 32 mmol/L 25  Glucose     70 - 99 mg/dL 155 (H)  BUN     8 - 23 mg/dL 9  Creatinine     0.61 - 1.24 mg/dL 0.96  Calcium     8.9 - 10.3 mg/dL 9.0  Total Protein     6.5 - 8.1 g/dL 7.0  Albumin     3.5 - 5.0 g/dL 3.4 (L)  AST     15 - 41 U/L 27  ALT     0 - 44 U/L 15  Alkaline Phosphatase     38 - 126 U/L 56  Total Bilirubin     0.3 - 1.2 mg/dL 0.6  GFR, Estimated     >60 mL/min >60  Anion gap     5 - 15 11   Component Prostate Specific Ag, Serum  Latest Ref Rng & Units 0.0 - 4.0 ng/mL  07/12/2017 1.0  10/01/2018 0.8  11/06/2019 1.1  11/10/2020 1.5  I have reviewed the labs.    Pertinent Imaging: Results for Ast, MEKO MASTERSON (MRN [id number]) as of 11/18/2020 11:02  Ref. Range 11/18/2020 10:35  Scan Result Unknown 116ml     Assessment & Plan:    1. BPH with LUTS -PSA stable -DRE benign -PVR < 300 cc -Continue tamsulosin 0.4 mg daily and finasteride 5 mg daily    Return in 1 year (on 11/18/2021) for one year for ipss, pvr and exam-psa prior.  Zara Council, PA-C   [facility] [address] [place], [place] [address] [phone]

## 2020-11-18 ENCOUNTER — Other Ambulatory Visit: Payer: Self-pay

## 2020-11-18 ENCOUNTER — Ambulatory Visit (INDEPENDENT_AMBULATORY_CARE_PROVIDER_SITE_OTHER): Payer: Medicare Other | Admitting: Urology

## 2020-11-18 ENCOUNTER — Encounter: Payer: Self-pay | Admitting: Urology

## 2020-11-18 VITALS — BP 150/81 | HR 84 | Ht 68.0 in | Wt 271.0 lb

## 2020-11-18 DIAGNOSIS — R351 Nocturia: Secondary | ICD-10-CM

## 2020-11-18 DIAGNOSIS — N138 Other obstructive and reflux uropathy: Secondary | ICD-10-CM | POA: Diagnosis not present

## 2020-11-18 DIAGNOSIS — N401 Enlarged prostate with lower urinary tract symptoms: Secondary | ICD-10-CM | POA: Diagnosis not present

## 2020-11-18 LAB — BLADDER SCAN AMB NON-IMAGING

## 2021-02-10 ENCOUNTER — Encounter: Payer: Self-pay | Admitting: Emergency Medicine

## 2021-02-10 ENCOUNTER — Emergency Department
Admission: EM | Admit: 2021-02-10 | Discharge: 2021-02-10 | Disposition: A | Discharge status: Home / Self Care | Payer: Medicare Other | Attending: Emergency Medicine | Admitting: Emergency Medicine

## 2021-02-10 ENCOUNTER — Other Ambulatory Visit: Payer: Self-pay

## 2021-02-10 DIAGNOSIS — R55 Syncope and collapse: Secondary | ICD-10-CM | POA: Insufficient documentation

## 2021-02-10 DIAGNOSIS — Z794 Long term (current) use of insulin: Secondary | ICD-10-CM | POA: Diagnosis not present

## 2021-02-10 DIAGNOSIS — Z79899 Other long term (current) drug therapy: Secondary | ICD-10-CM | POA: Insufficient documentation

## 2021-02-10 DIAGNOSIS — I10 Essential (primary) hypertension: Secondary | ICD-10-CM | POA: Diagnosis not present

## 2021-02-10 DIAGNOSIS — Z87891 Personal history of nicotine dependence: Secondary | ICD-10-CM | POA: Diagnosis not present

## 2021-02-10 DIAGNOSIS — G4730 Sleep apnea, unspecified: Secondary | ICD-10-CM | POA: Insufficient documentation

## 2021-02-10 DIAGNOSIS — E119 Type 2 diabetes mellitus without complications: Secondary | ICD-10-CM | POA: Insufficient documentation

## 2021-02-10 DIAGNOSIS — Z20822 Contact with and (suspected) exposure to covid-19: Secondary | ICD-10-CM | POA: Diagnosis not present

## 2021-02-10 DIAGNOSIS — I493 Ventricular premature depolarization: Secondary | ICD-10-CM

## 2021-02-10 LAB — TROPONIN I (HIGH SENSITIVITY)
Troponin I (High Sensitivity): 8 ng/L (ref <18)
Troponin I (High Sensitivity): 8 ng/L (ref <18)

## 2021-02-10 LAB — RESP PANEL BY RT-PCR (FLU A&B, COVID) ARPGX2
Influenza A by PCR: NEGATIVE
Influenza B by PCR: NEGATIVE
SARS Coronavirus 2 by RT PCR: NEGATIVE

## 2021-02-10 LAB — CBC WITH DIFFERENTIAL/PLATELET
Abs Immature Granulocytes: 0.03 10*3/uL (ref 0.00–0.07)
Basophils Absolute: 0 10*3/uL (ref 0.0–0.1)
Basophils Relative: 1 %
Eosinophils Absolute: 0.1 10*3/uL (ref 0.0–0.5)
Eosinophils Relative: 1 %
HCT: 35.8 % — ABNORMAL LOW (ref 39.0–52.0)
Hemoglobin: 11.8 g/dL — ABNORMAL LOW (ref 13.0–17.0)
Immature Granulocytes: 0 %
Lymphocytes Relative: 31 %
Lymphs Abs: 2.2 10*3/uL (ref 0.7–4.0)
MCH: 27.6 pg (ref 26.0–34.0)
MCHC: 33 g/dL (ref 30.0–36.0)
MCV: 83.6 fL (ref 80.0–100.0)
Monocytes Absolute: 0.8 10*3/uL (ref 0.1–1.0)
Monocytes Relative: 11 %
Neutro Abs: 3.9 10*3/uL (ref 1.7–7.7)
Neutrophils Relative %: 56 %
Platelets: 257 10*3/uL (ref 150–400)
RBC: 4.28 MIL/uL (ref 4.22–5.81)
RDW: 16.3 % — ABNORMAL HIGH (ref 11.5–15.5)
WBC: 7.1 10*3/uL (ref 4.0–10.5)
nRBC: 0 % (ref 0.0–0.2)

## 2021-02-10 LAB — BASIC METABOLIC PANEL
Anion gap: 6 (ref 5–15)
BUN: 17 mg/dL (ref 8–23)
CO2: 25 mmol/L (ref 22–32)
Calcium: 8.7 mg/dL — ABNORMAL LOW (ref 8.9–10.3)
Chloride: 104 mmol/L (ref 98–111)
Creatinine, Ser: 0.81 mg/dL (ref 0.61–1.24)
GFR, Estimated: >60 mL/min (ref >60)
Glucose, Bld: 127 mg/dL — ABNORMAL HIGH (ref 70–99)
Potassium: 4 mmol/L (ref 3.5–5.1)
Sodium: 135 mmol/L (ref 135–145)

## 2021-02-10 MED ORDER — SODIUM CHLORIDE 0.9 % IV BOLUS
500.0000 mL | Freq: Once | INTRAVENOUS | Status: AC
  Administered 2021-02-10: 08:00:00 500 mL via INTRAVENOUS

## 2021-02-10 NOTE — Discharge Instructions (Signed)
We have arranged for cardiology follow-up.  You should go to [facility] to see Dr. Yancey Flemings tomorrow morning at 9 AM.  Return to the ER for new, worsening, or recurrent episodes of lightheadedness or passing out, chest pain, palpitations, weakness, difficulty breathing, or any other new or worsening symptoms that concern you.

## 2021-02-10 NOTE — ED Triage Notes (Signed)
Patient to ED via [facility] from home for near syncopal episode. Patient is c/o weakness and dizziness at this time. NAD noted. Aox4.

## 2021-02-10 NOTE — ED Provider Notes (Signed)
[facility] Emergency Department Provider Note ____________________________________________   Event Date/Time   First MD Initiated Contact with Patient 02/10/21 [phone]     (approximate)  I have reviewed the triage vital signs and the nursing notes.   HISTORY  Chief Complaint Near Syncope    HPI Randy [name] is a 70 y.o. male with PMH as noted below including diabetes, hypertension, and hypercholesterolemia who presents with syncope, acute onset this morning when he was eating.  The patient states that he got up, took his medications including his insulin, and then while having breakfast he started to feel dizzy and lightheaded.  He became sweaty, and then states that he "blacked out."  He states that this was brief and he did not fall or injure himself.  He states he feels somewhat weak now but denies any other acute complaints.  He was feeling fine yesterday and denies any changes in his routine.  He has had no associated chest pain, palpitations, shortness of breath, headache, fever, or other acute symptoms.  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 Authorizing Provider  ACCU-CHEK AVIVA PLUS test strip  09/28/20   [provider]  albuterol (VENTOLIN HFA) 108 (90 Base) MCG/ACT inhaler Inhale into the lungs. 09/09/19   [provider]  allopurinol (ZYLOPRIM) 100 MG tablet Take 1 tablet by mouth daily. 11/04/20   [provider]  aspirin 81 MG tablet Take 81 mg by mouth  daily.    [provider]  atorvastatin (LIPITOR) 40 MG tablet Take 40 mg by mouth daily.    [provider]  BD INSULIN SYRINGE U/F 31G X 5/16" 0.5 ML MISC 4 (four) times daily. 11/13/20   [provider]  finasteride (PROSCAR) 5 MG tablet TAKE 1 TABLET BY MOUTH DAILY FOR ENLARGED PROSTATE ALONG WITH TAMSULOSIN 08/20/18   [provider]  fluticasone (FLONASE) 50 MCG/ACT nasal spray Place 2 sprays into both nostrils daily. 09/05/19   [provider]  insulin glargine (LANTUS) 100 UNIT/ML injection Inject 50 Units into the skin 2 (two) times daily.    [provider]  insulin lispro (HUMALOG) 100 UNIT/ML injection Inject 30 Units into the skin 3 (three) times daily with meals.    [provider]  lidocaine (LIDODERM) 5 % Place 1 patch onto the skin every 12 (twelve) hours. Remove & Discard patch within 12 hours or as directed by MD 10/23/20 10/23/21  Caryn Section, Linden Dolin, PA-C  lisinopril (PRINIVIL,ZESTRIL) 10 MG tablet Take 10 mg by mouth daily.    [provider]  meloxicam (MOBIC) 15 MG tablet Take 1 tablet (15 mg total) by mouth daily. 10/23/20 10/23/21  Caryn Section, Linden Dolin, PA-C  ondansetron (ZOFRAN ODT) 4 MG disintegrating tablet Take 1 tablet (4 mg total) by mouth every 8 (eight) hours as needed for nausea or vomiting. 11/11/20   Nena Polio, MD  oxyCODONE-acetaminophen (PERCOCET) 5-325 MG tablet Take 1 tablet by mouth every 4 (four)  hours as needed for severe pain. 11/11/20 11/11/21  Nena Polio, MD  tamsulosin (FLOMAX) 0.4 MG CAPS capsule TAKE 1 CAPSULE BY MOUTH EVERY DAY 30 MINUTES AFTER THE SAME MEAL 08/20/18   [provider]    Allergies Patient has no known allergies.  Family History  Problem Relation Age of Onset   Prostate cancer Neg Hx    Bladder Cancer Neg Hx    Kidney cancer Neg Hx     Social History Social History   Tobacco Use   Smoking status: Former    Types: Cigarettes   Smokeless tobacco: Never   Vaping Use   Vaping Use: Never used  Substance Use Topics   Alcohol use: Yes    Comment: infreqently   Drug use: No    Review of Systems  Constitutional: No fever/chills Eyes: No visual changes. ENT: No sore throat. Cardiovascular: Denies chest pain. Respiratory: Denies shortness of breath. Gastrointestinal: No vomiting or diarrhea.  Genitourinary: Negative for dysuria.  Musculoskeletal: Negative for back pain. Skin: Negative for rash. Neurological: Negative for headaches, focal weakness or numbness.   ____________________________________________   PHYSICAL EXAM:  VITAL SIGNS: ED Triage Vitals  Enc Vitals Group     BP 02/10/21 0738 (!) 120/57     Pulse Rate 02/10/21 0738 (!) 101     Resp 02/10/21 0738 18     Temp 02/10/21 0738 98.5 F (36.9 C)     Temp Source 02/10/21 0738 Oral     SpO2 02/10/21 0736 97 %     Weight 02/10/21 0739 268 lb 4.8 oz (121.7 kg)     Height 02/10/21 0739 5\' 8"  (1.727 m)     Head Circumference --      Peak Flow --      Pain Score 02/10/21 0739 0     Pain Loc --      Pain Edu? --      Excl. in [place]? --     Constitutional: Alert and oriented. Well appearing and in no acute distress. Eyes: Conjunctivae are normal.  EOMI.  PERRLA. Head: Atraumatic. Nose: No congestion/rhinnorhea. Mouth/Throat: Mucous membranes are moist.   Neck: Normal range of motion.  Cardiovascular: Normal rate, regular rhythm. Grossly normal heart sounds.  Good peripheral circulation. Respiratory: Normal respiratory effort.  No retractions. Lungs CTAB. Gastrointestinal: Soft and nontender. No distention.  Genitourinary: No flank tenderness. Musculoskeletal: No lower extremity edema.  Extremities warm and well perfused.  Neurologic:  Normal speech and language.  Motor intact in all extremities.  Normal coordination. Skin:  Skin is warm and dry. No rash noted. Psychiatric: Mood and affect are normal. Speech and behavior are  normal.  ____________________________________________   LABS (all labs ordered are listed, but only abnormal results are displayed)  Labs Reviewed  BASIC METABOLIC PANEL - Abnormal; Notable for the following components:      Result Value   Glucose, Bld 127 (*)    Calcium 8.7 (*)    All other components within normal limits  CBC WITH DIFFERENTIAL/PLATELET - Abnormal; Notable for the following components:   Hemoglobin 11.8 (*)    HCT 35.8 (*)    RDW 16.3 (*)    All other components within normal limits  RESP PANEL BY RT-PCR (FLU A&B, COVID) [id number]  URINALYSIS, ROUTINE W REFLEX MICROSCOPIC  TROPONIN I (HIGH SENSITIVITY)  TROPONIN I (HIGH SENSITIVITY)   ____________________________________________  EKG  ED ECG REPORT I, Arta Silence, the attending physician, personally viewed and interpreted this ECG.  Date:  02/10/2021 EKG Time: 0748 Rate: 101 Rhythm: normal sinus rhythm with occasional multiform PVCs QRS Axis: normal Intervals: normal ST/T Wave abnormalities: normal Narrative Interpretation: PVCs with no evidence of acute ischemia   ED ECG REPORT I, Arta Silence, the attending physician, personally viewed and interpreted this ECG.  Date: 02/10/2021 EKG Time: 0947 Rate: 90 Rhythm: normal sinus rhythm with occasional PVCs QRS Axis: normal Intervals: normal ST/T Wave abnormalities: normal Narrative Interpretation: no evidence of acute ischemia   ____________________________________________  RADIOLOGY    ____________________________________________   PROCEDURES  Procedure(s) performed: No  Procedures  Critical Care performed: No ____________________________________________   INITIAL IMPRESSION / ASSESSMENT AND PLAN / ED COURSE  Pertinent labs & imaging results that were available during my care of the patient were reviewed by me and considered in my medical decision making (see chart for details).   70 year old male with PMH as noted  above including diabetes, hypertension, and hypercholesterolemia presents with syncope while eating this morning with a brief prodrome of lightheadedness and diaphoresis.  He denies other associated symptoms.  On exam he is overall well-appearing and his vital signs are normal except for borderline tachycardia.  Neurologic exam is nonfocal.  Physical exam is otherwise unremarkable.  Differential includes vasovagal episode or other benign etiology versus less likely dysrhythmia or other cardiac cause.  EKG is unremarkable except for PVCs.  We will obtain basic labs, troponins, give a fluid bolus, and observe the patient.  ----------------------------------------- 11:14 AM on 02/10/2021 -----------------------------------------  Troponins are negative x2.  The rest of the lab work-up is unremarkable.  The patient's vital signs have remained stable with a heart rate in the 80s.  Repeat EKG showed a single PVC, and he remains in sinus on the monitor.  He has been completely asymptomatic since arriving to the ED.  At this time, I have a low suspicion for cardiac etiology.  Presentation is most consistent with vasovagal episode or other benign cause.  I did discuss with the patient the possibility of observation admission, but he states he feels well and would prefer to go home.  I also consulted Dr. Humphrey Rolls from cardiology.  He agrees with discharge home and can see the patient tomorrow morning at 9 AM.  Return precautions given, and the patient expresses understanding.  ____________________________________________   FINAL CLINICAL IMPRESSION(S) / ED DIAGNOSES  Final diagnoses:  Syncope, unspecified syncope type  PVC's (premature ventricular contractions)      NEW MEDICATIONS STARTED DURING THIS VISIT:  New Prescriptions   No medications on file     Note:  This document was prepared using Dragon voice recognition software and may include unintentional dictation errors.    Arta Silence, MD 02/10/21 1115

## 2021-05-19 ENCOUNTER — Emergency Department: Payer: Medicare Other

## 2021-05-19 ENCOUNTER — Emergency Department
Admission: EM | Admit: 2021-05-19 | Discharge: 2021-05-19 | Disposition: A | Discharge status: Home / Self Care | Payer: Medicare Other | Attending: Emergency Medicine | Admitting: Emergency Medicine

## 2021-05-19 ENCOUNTER — Encounter: Payer: Self-pay | Admitting: Radiology

## 2021-05-19 DIAGNOSIS — T5491XA Toxic effect of unspecified corrosive substance, accidental (unintentional), initial encounter: Secondary | ICD-10-CM | POA: Diagnosis not present

## 2021-05-19 DIAGNOSIS — Y92009 Unspecified place in unspecified non-institutional (private) residence as the place of occurrence of the external cause: Secondary | ICD-10-CM | POA: Diagnosis not present

## 2021-05-19 DIAGNOSIS — Y906 Blood alcohol level of 120-199 mg/100 ml: Secondary | ICD-10-CM | POA: Insufficient documentation

## 2021-05-19 DIAGNOSIS — X58XXXA Exposure to other specified factors, initial encounter: Secondary | ICD-10-CM | POA: Diagnosis not present

## 2021-05-19 DIAGNOSIS — R55 Syncope and collapse: Secondary | ICD-10-CM | POA: Diagnosis present

## 2021-05-19 DIAGNOSIS — I10 Essential (primary) hypertension: Secondary | ICD-10-CM | POA: Diagnosis not present

## 2021-05-19 DIAGNOSIS — F10920 Alcohol use, unspecified with intoxication, uncomplicated: Secondary | ICD-10-CM | POA: Insufficient documentation

## 2021-05-19 DIAGNOSIS — R42 Dizziness and giddiness: Secondary | ICD-10-CM | POA: Diagnosis present

## 2021-05-19 DIAGNOSIS — Y93E5 Activity, floor mopping and cleaning: Secondary | ICD-10-CM | POA: Insufficient documentation

## 2021-05-19 DIAGNOSIS — R519 Headache, unspecified: Secondary | ICD-10-CM | POA: Diagnosis not present

## 2021-05-19 LAB — COMPREHENSIVE METABOLIC PANEL
ALT: 12 U/L (ref 0–44)
AST: 18 U/L (ref 15–41)
Albumin: 3.4 g/dL — ABNORMAL LOW (ref 3.5–5.0)
Alkaline Phosphatase: 53 U/L (ref 38–126)
Anion gap: 9 (ref 5–15)
BUN: 10 mg/dL (ref 8–23)
CO2: 26 mmol/L (ref 22–32)
Calcium: 8.5 mg/dL — ABNORMAL LOW (ref 8.9–10.3)
Chloride: 100 mmol/L (ref 98–111)
Creatinine, Ser: 0.85 mg/dL (ref 0.61–1.24)
GFR, Estimated: >60 mL/min (ref >60)
Glucose, Bld: 131 mg/dL — ABNORMAL HIGH (ref 70–99)
Potassium: 3 mmol/L — ABNORMAL LOW (ref 3.5–5.1)
Sodium: 135 mmol/L (ref 135–145)
Total Bilirubin: 0.5 mg/dL (ref 0.3–1.2)
Total Protein: 7.4 g/dL (ref 6.5–8.1)

## 2021-05-19 LAB — CBC WITH DIFFERENTIAL/PLATELET
Abs Immature Granulocytes: 0.06 10*3/uL (ref 0.00–0.07)
Basophils Absolute: 0.1 10*3/uL (ref 0.0–0.1)
Basophils Relative: 1 %
Eosinophils Absolute: 0.2 10*3/uL (ref 0.0–0.5)
Eosinophils Relative: 2 %
HCT: 33.9 % — ABNORMAL LOW (ref 39.0–52.0)
Hemoglobin: 10.9 g/dL — ABNORMAL LOW (ref 13.0–17.0)
Immature Granulocytes: 1 %
Lymphocytes Relative: 29 %
Lymphs Abs: 2.6 10*3/uL (ref 0.7–4.0)
MCH: 27.1 pg (ref 26.0–34.0)
MCHC: 32.2 g/dL (ref 30.0–36.0)
MCV: 84.3 fL (ref 80.0–100.0)
Monocytes Absolute: 0.8 10*3/uL (ref 0.1–1.0)
Monocytes Relative: 9 %
Neutro Abs: 5.1 10*3/uL (ref 1.7–7.7)
Neutrophils Relative %: 58 %
Platelets: 325 10*3/uL (ref 150–400)
RBC: 4.02 MIL/uL — ABNORMAL LOW (ref 4.22–5.81)
RDW: 14.6 % (ref 11.5–15.5)
WBC: 8.8 10*3/uL (ref 4.0–10.5)
nRBC: 0 % (ref 0.0–0.2)

## 2021-05-19 LAB — ETHANOL: Alcohol, Ethyl (B): 123 mg/dL — ABNORMAL HIGH (ref <10)

## 2021-05-19 MED ORDER — ONDANSETRON HCL 4 MG/2ML IJ SOLN
4.0000 mg | Freq: Once | INTRAMUSCULAR | Status: AC
  Administered 2021-05-19: 4 mg via INTRAVENOUS
  Filled 2021-05-19: qty 2

## 2021-05-19 MED ORDER — IOHEXOL 350 MG/ML SOLN
80.0000 mL | Freq: Once | INTRAVENOUS | Status: AC | PRN
  Administered 2021-05-19: 80 mL via INTRAVENOUS

## 2021-05-19 MED ORDER — SODIUM CHLORIDE 0.9 % IV BOLUS
1000.0000 mL | Freq: Once | INTRAVENOUS | Status: AC
  Administered 2021-05-19: 1000 mL via INTRAVENOUS

## 2021-05-19 NOTE — ED Provider Notes (Signed)
? ?[facility] ?Provider Note ? ? ? Event Date/Time  ? First MD Initiated Contact with Patient 05/19/21 2001   ?  (approximate) ? ? ?History  ? ?Seizures ? ? ?HPI ? ?Randy [name] is a 71 y.o. male with a past history of hypertension who comes ED complaining of syncope. ? ?Patient reports that yesterday and today he has been mopping his floors with a bleach solution which has exposed him to strong fumes that made him feel dizzy.  Denies any chest pain or shortness of breath.  After he finished today, he was sitting on the couch watching TV, had 2 shots of liquor and a beer, and then subsequently lost consciousness.  He denies any preceding symptoms such as headache vision changes numbness tingling weakness chest pain shortness of breath or any other pain complaint.  After he woke up, he noted that he had a severe generalized headache without any other new neurologic symptoms. ? ?Denies any trauma.  No fever or neck stiffness.  No seizure history.  No urinary incontinence. ?  ? ? ?Physical Exam  ? ?Triage Vital Signs: ?ED Triage Vitals  ?Enc Vitals Group  ?   BP 05/19/21 2003 114/62  ?   Pulse Rate 05/19/21 1953 100  ?   Resp 05/19/21 1953 20  ?   Temp 05/19/21 1953 97.7 ?F (36.5 ?C)  ?   Temp Source 05/19/21 1953 Oral  ?   SpO2 05/19/21 1953 97 %  ?   Weight 05/19/21 1956 260 lb (117.9 kg)  ?   Height 05/19/21 1956 '5\' 8"'$  (1.727 m)  ?   Head Circumference --   ?   Peak Flow --   ?   Pain Score --   ?   Pain Loc --   ?   Pain Edu? --   ?   Excl. in [place]? --   ? ? ?Most recent vital signs: ?Vitals:  ? 05/19/21 2003 05/19/21 2326  ?BP: 114/62 (!) 147/80  ?Pulse:  95  ?Resp:  20  ?Temp:    ?SpO2:  95%  ? ? ? ?General: Awake, no distress.  Oriented x3 ?CV:  Good peripheral perfusion.  Regular rate rhythm ?Resp:  Normal effort.  Clear to auscultation bilaterally ?Abd:  No distention.  Soft nontender ?Other:  Neuro intact.  No drift, PERRL, EOMI, cranial nerves III through XII intact.  Cerebellar  function intact. ? ? ?ED Results / Procedures / Treatments  ? ?Labs ?(all labs ordered are listed, but only abnormal results are displayed) ?Labs Reviewed  ?COMPREHENSIVE METABOLIC PANEL - Abnormal; Notable for the following components:  ?    Result Value  ? Potassium 3.0 (*)   ? Glucose, Bld 131 (*)   ? Calcium 8.5 (*)   ? Albumin 3.4 (*)   ? All other components within normal limits  ?CBC WITH DIFFERENTIAL/PLATELET - Abnormal; Notable for the following components:  ? RBC 4.02 (*)   ? Hemoglobin 10.9 (*)   ? HCT 33.9 (*)   ? All other components within normal limits  ?ETHANOL - Abnormal; Notable for the following components:  ? Alcohol, Ethyl (B) 123 (*)   ? All other components within normal limits  ? ? ? ?EKG ? ?Interpreted by me ?Sinus rhythm rate of 91.  Normal axis and intervals.  Normal QRS ST segments and T waves. ? ? ?RADIOLOGY ?CT angiogram head viewed and interpreted by me, no mass, no intracranial hemorrhage.  Radiology report  reviewed. ? ? ? ?PROCEDURES: ? ?Critical Care performed: No ? ?.1-3 Lead EKG Interpretation ?Performed by: Carrie Mew, MD ?Authorized by: Carrie Mew, MD  ? ?  Interpretation: normal   ?  ECG rate:  80 ?  ECG rate assessment: normal   ?  Rhythm: sinus rhythm   ?  Ectopy: none   ?  Conduction: normal   ? ? ?MEDICATIONS ORDERED IN ED: ?Medications  ?sodium chloride 0.9 % bolus 1,000 mL (1,000 mLs Intravenous New Bag/Given 05/19/21 2050)  ?ondansetron [facility]) injection 4 mg (4 mg Intravenous Given 05/19/21 2055)  ?iohexol (OMNIPAQUE) 350 MG/ML injection 80 mL (80 mLs Intravenous Contrast Given 05/19/21 2149)  ? ? ? ?IMPRESSION / MDM / ASSESSMENT AND PLAN / ED COURSE  ?I reviewed the triage vital signs and the nursing notes. ?             ?               ? ?Differential diagnosis includes, but is not limited to, dehydration, electrode abnormality, alcohol intoxication, intracranial hemorrhage, somnolence ? ?**The patient is on the cardiac monitor to evaluate for evidence of  arrhythmia and/or significant heart rate changes.**} ? ?Patient presents with an episode of syncope.  Presentation not consistent with seizure.  Low suspicion for neurologic or vascular event, but with his syncope and headache, will obtain a CT angiogram of the head.  Most likely this is due to intoxication from alcohol on top of volatile fume exposure.  He is nontoxic now.  Will give IV fluids for hydration and Zofran. ? ? ?----------------------------------------- ?11:31 PM on 05/19/2021 ?----------------------------------------- ?Remained stable.  Work-up is reassuring, CT head negative.  Stable for discharge. ?  ? ? ?FINAL CLINICAL IMPRESSION(S) / ED DIAGNOSES  ? ?Final diagnoses:  ?Syncope, unspecified syncope type  ?Alcoholic intoxication without complication ([place])  ? ? ? ?Rx / DC Orders  ? ?ED Discharge Orders   ? ? None  ? ?  ? ? ? ?Note:  This document was prepared using Dragon voice recognition software and may include unintentional dictation errors. ?  ?Carrie Mew, MD ?05/19/21 2331 ? ?

## 2021-05-19 NOTE — ED Triage Notes (Signed)
71 y/o male arrived to the [facility] via EMS coming from home with a CC of seizures. Pt family states she witnessed seizure like activity. Pt denies having a history of seizures. BGL for EMS was 171. Pt states he was exposed to bleach for long period of time and felt light headed and dizzy. Pt is alert and oriented x4 upon arrival to [facility] room 10 ?

## 2021-05-19 NOTE — Discharge Instructions (Signed)
Your lab tests and CT scan of the head were all okay today.  Please follow-up with your doctor for further evaluation. ?

## 2021-11-04 ENCOUNTER — Other Ambulatory Visit: Payer: Self-pay

## 2021-11-04 ENCOUNTER — Encounter: Payer: Self-pay | Admitting: Gastroenterology

## 2021-11-04 ENCOUNTER — Ambulatory Visit (INDEPENDENT_AMBULATORY_CARE_PROVIDER_SITE_OTHER): Payer: Medicare Other | Admitting: Gastroenterology

## 2021-11-04 VITALS — BP 138/71 | HR 72 | Temp 98.7°F | Ht 66.0 in | Wt 253.2 lb

## 2021-11-04 DIAGNOSIS — D508 Other iron deficiency anemias: Secondary | ICD-10-CM

## 2021-11-04 MED ORDER — NA SULFATE-K SULFATE-MG SULF 17.5-3.13-1.6 GM/177ML PO SOLN: 354.0000 mL | Freq: Once | ORAL | 0 refills | Status: AC

## 2021-11-04 NOTE — Progress Notes (Signed)
[name] MD, MRCP(U.K) [address]  [place]  [place], [place] [address]  Main: [phone]  Fax: [phone]   Gastroenterology Consultation  Referring Provider:     Verlin Dike, MD Primary Care Physician:  Center, [facility] Primary Gastroenterologist:  Dr. [name]  Reason for Consultation:    Iron deficiency anemia         HPI:   Randy [name] is a 71 y.o. y/o male referred for iron deficiency anemia .  She was referred back in May 2023 office note by Dr. Foye Deer suggests iron deficiency anemia appears he had a normal colonoscopy in 2017 had been taking iron stools but no overt blood loss patient also had meloxicam as one of the active medications in addition to topical Voltaren gel.  No iron studies available in the media that was scanned.  In March 2023 presented to the emergency room with syncopal episode hemoglobin was 10.9 g with an MCV of 84.3 I do not have any iron studies at that time He denies any blood in the stool, tissue paper, change in shape of his stool change in bowel habits unintentional weight loss blood in the urine nasal bleeds.  He has been taking meloxicam for a while he was really not sure why he takes it but was told he has to take it.  Also takes a baby aspirin.  Past Medical History:  Diagnosis Date   Diabetes mellitus without complication ([place])    Hypercholesteremia    Hypertension    Sleep apnea     Past Surgical History:  Procedure Laterality Date   HERNIA REPAIR      Prior to Admission medications   Medication Sig Start Date End Date Taking? Authorizing Provider  ACCU-CHEK AVIVA PLUS test strip  09/28/20   [provider]  albuterol (VENTOLIN HFA) 108 (90 Base) MCG/ACT inhaler Inhale into the lungs. 09/09/19   [provider]  allopurinol (ZYLOPRIM) 100 MG tablet Take 1 tablet by mouth daily. 11/04/20   [provider]  aspirin 81 MG tablet Take 81 mg by mouth daily.     [provider]  atorvastatin (LIPITOR) 40 MG tablet Take 40 mg by mouth daily.    [provider]  BD INSULIN SYRINGE U/F 31G X 5/16" 0.5 ML MISC 4 (four) times daily. 11/13/20   [provider]  finasteride (PROSCAR) 5 MG tablet TAKE 1 TABLET BY MOUTH DAILY FOR ENLARGED PROSTATE ALONG WITH TAMSULOSIN 08/20/18   [provider]  fluticasone (FLONASE) 50 MCG/ACT nasal spray Place 2 sprays into both nostrils daily. 09/05/19   [provider]  insulin glargine (LANTUS) 100 UNIT/ML injection Inject 50 Units into the skin 2 (two) times daily.    [provider]  insulin lispro (HUMALOG) 100 UNIT/ML injection Inject 30 Units into the skin 3 (three) times daily with meals.    [provider]  lisinopril (PRINIVIL,ZESTRIL) 10 MG tablet Take 10 mg by mouth daily.    [provider]  ondansetron (ZOFRAN ODT) 4 MG disintegrating tablet Take 1 tablet (4 mg total) by mouth every 8 (eight) hours as needed for nausea or vomiting. 11/11/20   Nena Polio, MD  oxyCODONE-acetaminophen (PERCOCET) 5-325 MG tablet Take 1 tablet by mouth every 4 (four) hours as needed for severe pain. 11/11/20 11/11/21  Nena Polio, MD  tamsulosin (FLOMAX) 0.4 MG CAPS capsule TAKE 1 CAPSULE BY MOUTH EVERY DAY 30 MINUTES AFTER THE SAME  MEAL 08/20/18   [provider]    Family History  Problem Relation Age of Onset   Prostate cancer Neg Hx    Bladder Cancer Neg Hx    Kidney cancer Neg Hx      Social History   Tobacco Use   Smoking status: Former    Types: Cigarettes   Smokeless tobacco: Never  Vaping Use   Vaping Use: Never used  Substance Use Topics   Alcohol use: Yes    Comment: infreqently   Drug use: No    Allergies as of 11/04/2021   (No Known Allergies)    Review of Systems:    All systems reviewed and negative except where noted in HPI.   Physical Exam:  BP 138/71   Pulse 72   Temp 98.7 F (37.1 C) (Oral)   Ht '5\' 6"'$  (1.676  m)   Wt 253 lb 3.2 oz (114.9 kg)   BMI 40.87 kg/m  No LMP for male patient. Psych:  Alert and cooperative. Normal mood and affect. General:   Alert,  Well-developed, well-nourished, pleasant and cooperative in NAD Head:  Normocephalic and atraumatic. Eyes:  Sclera clear, no icterus.   Conjunctiva pink. Ears:  Normal auditory acuity.  Neurologic:  Alert and oriented x3;  grossly normal neurologically. Psych:  Alert and cooperative. Normal mood and affect.  Imaging Studies: No results found.  Assessment and Plan:   Randy [name] is a 71 y.o. y/o male has been referred for iron deficiency anemia.  No labs available.  Possible long-term use of meloxicam may have caused anemia  Plan Check iron studies, b12,folate,ferritin, TSH,celiac serology and urine . If iron deficiency would need IV iron   2.  EGD and colonoscopy if negative will require capsule study of the small bowel. 3.  Stop meloxicam use  I have discussed alternative options, risks & benefits,  which include, but are not limited to, bleeding, infection, perforation,respiratory complication & drug reaction.  The patient agrees with this plan & written consent will be obtained.     Follow up in  8-12 weeks  Dr [name] MD,MRCP(U.K)

## 2021-11-10 NOTE — Progress Notes (Signed)
Inform b12 low- commence on b12 108mg daily and send copy to referring physician to follow up on

## 2021-11-11 ENCOUNTER — Other Ambulatory Visit: Payer: Medicare Other

## 2021-11-11 DIAGNOSIS — N138 Other obstructive and reflux uropathy: Secondary | ICD-10-CM

## 2021-11-11 LAB — URINALYSIS
Bilirubin, UA: NEGATIVE
Ketones, UA: NEGATIVE
Leukocytes,UA: NEGATIVE
Nitrite, UA: NEGATIVE
Protein,UA: NEGATIVE
RBC, UA: NEGATIVE
Specific Gravity, UA: 1.021 (ref 1.005–1.030)
Urobilinogen, Ur: 1 mg/dL (ref 0.2–1.0)
pH, UA: 6.5 (ref 5.0–7.5)

## 2021-11-11 LAB — H. PYLORI BREATH TEST: H pylori Breath Test: POSITIVE — AB

## 2021-11-11 LAB — IRON,TIBC AND FERRITIN PANEL
Ferritin: 12 ng/mL — ABNORMAL LOW (ref 30–400)
Iron Saturation: 8 % — CL (ref 15–55)
Iron: 26 ug/dL — ABNORMAL LOW (ref 38–169)
Total Iron Binding Capacity: 326 ug/dL (ref 250–450)
UIBC: 300 ug/dL (ref 111–343)

## 2021-11-11 LAB — B12 AND FOLATE PANEL
Folate: 11.4 ng/mL (ref >3.0)
Vitamin B-12: 216 pg/mL — ABNORMAL LOW (ref 232–1245)

## 2021-11-11 LAB — CELIAC DISEASE AB SCREEN W/RFX
Antigliadin Abs, IgA: 4 units (ref 0–19)
IgA/Immunoglobulin A, Serum: 232 mg/dL (ref 61–437)
Transglutaminase IgA: <2 U/mL (ref 0–3)

## 2021-11-12 LAB — PSA: Prostate Specific Ag, Serum: 1.4 ng/mL (ref 0.0–4.0)

## 2021-11-16 ENCOUNTER — Telehealth: Payer: Self-pay

## 2021-11-16 DIAGNOSIS — D508 Other iron deficiency anemias: Secondary | ICD-10-CM

## 2021-11-16 NOTE — Telephone Encounter (Signed)
-----   Message from [name], MD sent at 11/10/2021  7:43 AM EDT ----- Inform b12 low- commence on b12 1049mg daily and send copy to referring physician to follow up on

## 2021-11-16 NOTE — Progress Notes (Unsigned)
11/17/21 10:49 AM   [name] 11/27/1950 [id number]  Referring provider:  [facility], [place],  [place] [address]  Urological history  1. BPH w/ LUTS - PSA (10/2021) 1.4  - IPSS 14/6 - PVR 100 mL - tamsulosin 0.4 mg and finasteride 5 mg daily  2. Nocturia -Risk factors for nocturia: obstructive sleep apnea, hypertension, diabetes and BPH -untreated sleep apnea   HPI: Randy [name] is a 71 y.o.male with a history of BPH with LUTS and nocturia, who presents today for follow-up on PSA, IPSS, and exam.   He is having nocturia x 6.  He has been diagnosed with sleep apnea, but he has not received his machine.  Patient denies any modifying or aggravating factors.  Patient denies any gross hematuria, dysuria or suprapubic/flank pain.  Patient denies any fevers, chills, nausea or vomiting.     IPSS     Row Name 11/17/21 1000         International Prostate Symptom Score   How often have you had the sensation of not emptying your bladder? Not at All     How often have you had to urinate less than every two hours? More than half the time     How often have you found you stopped and started again several times when you urinated? Not at All     How often have you found it difficult to postpone urination? Not at All     How often have you had a weak urinary stream? Almost always     How often have you had to strain to start urination? Not at All     How many times did you typically get up at night to urinate? 5 Times     Total IPSS Score 14       Quality of Life due to urinary symptoms   If you were to spend the rest of your life with your urinary condition just the way it is now how would you feel about that? Terrible               Score:  1-7 Mild 8-19 Moderate 20-35 Severe      PMH: Past Medical History:  Diagnosis Date   Diabetes mellitus without complication ([place])    Hypercholesteremia    Hypertension     Sleep apnea     Surgical History: Past Surgical History:  Procedure Laterality Date   HERNIA REPAIR      Home Medications:  Allergies as of 11/17/2021   No Known Allergies      Medication List        Accurate as of November 17, 2021 10:49 AM. If you have any questions, ask your nurse or doctor.          Accu-Chek Aviva Plus test strip Generic drug: glucose blood   albuterol 108 (90 Base) MCG/ACT inhaler Commonly known as: VENTOLIN HFA Inhale into the lungs.   allopurinol 100 MG tablet Commonly known as: ZYLOPRIM Take 1 tablet by mouth daily.   aspirin 81 MG tablet Take 81 mg by mouth daily.   atorvastatin 40 MG tablet Commonly known as: LIPITOR Take 40 mg by mouth daily.   BD Insulin Syringe U/F 31G X 5/16" 0.5 ML Misc Generic drug: Insulin Syringe-Needle U-100 4 (four) times daily.   ferrous sulfate 325 (65 FE) MG tablet Take 325 mg by mouth every other day.   finasteride 5 MG tablet Commonly  known as: PROSCAR TAKE 1 TABLET BY MOUTH DAILY FOR ENLARGED PROSTATE ALONG WITH TAMSULOSIN   fluticasone 50 MCG/ACT nasal spray Commonly known as: FLONASE Place 2 sprays into both nostrils daily.   insulin glargine 100 UNIT/ML injection Commonly known as: LANTUS Inject 50 Units into the skin 2 (two) times daily.   insulin lispro 100 UNIT/ML injection Commonly known as: HUMALOG Inject 30 Units into the skin 3 (three) times daily with meals.   Jardiance 10 MG Tabs tablet Generic drug: empagliflozin Take 10 mg by mouth daily.   lisinopril 10 MG tablet Commonly known as: ZESTRIL Take 10 mg by mouth daily.   ondansetron 4 MG disintegrating tablet Commonly known as: Zofran ODT Take 1 tablet (4 mg total) by mouth every 8 (eight) hours as needed for nausea or vomiting.   tamsulosin 0.4 MG Caps capsule Commonly known as: FLOMAX TAKE 1 CAPSULE BY MOUTH EVERY DAY 30 MINUTES AFTER THE SAME MEAL        Allergies:  No Known Allergies  Family  History: Family History  Problem Relation Age of Onset   Prostate cancer Neg Hx    Bladder Cancer Neg Hx    Kidney cancer Neg Hx     Social History:  reports that he has quit smoking. His smoking use included cigarettes. He has never used smokeless tobacco. He reports current alcohol use. He reports that he does not use drugs.   Physical Exam: BP 136/73   Pulse 76   Ht '5\' 8"'$  (1.727 m)   Wt 252 lb (114.3 kg)   BMI 38.32 kg/m   Constitutional:  Well nourished. Alert and oriented, No acute distress. HEENT: [place] AT, moist mucus membranes.  Trachea midline Cardiovascular: No clubbing, cyanosis, or edema. Respiratory: Normal respiratory effort, no increased work of breathing. GU: No CVA tenderness.  No bladder fullness or masses.  Patient with uncircumcised phallus. Foreskin easily retracted  Urethral meatus is patent.  No penile discharge. No penile lesions or rashes. Scrotum without lesions, cysts, rashes and/or edema.  Testicles are located scrotally bilaterally. No masses are appreciated in the testicles. Left and right epididymis are normal. Rectal: Patient with  normal sphincter tone. Anus and perineum without scarring or rashes. No rectal masses are appreciated. Prostate is approximately 50 + grams, could only palpate apex and midportion of the gland, no nodules are appreciated. Seminal vesicles could not be palpated.  I noted a large dilated pore of [place] on his right thigh with the keratin plug sticking out like a horn.  I grabbed the keratin plug with my gloves and was able to extract the entire contents.  The keratin plug was the size of a peanut M & M Neurologic: Grossly intact, no focal deficits, moving all 4 extremities. Psychiatric: Normal mood and affect.   Laboratory Data: Component     Latest Ref Rng 11/11/2021  Prostate Specific Ag, Serum     0.0 - 4.0 ng/mL 1.4    Lab Results  Component Value Date   CREATININE 0.85 05/19/2021   Component     Latest Ref Rng 11/09/2021   Specific Gravity, UA     1.005 - 1.030  1.021   pH, UA     5.0 - 7.5  6.5   Color, UA     Yellow  Yellow   Appearance Ur     Clear  Clear   Leukocytes,UA     Negative  Negative   Protein,UA     Negative/Trace  Negative  Glucose, UA     Negative  3+ !   Ketones, UA     Negative  Negative   RBC, UA     Negative  Negative   Bilirubin, UA     Negative  Negative   Urobilinogen, Ur     0.2 - 1.0 mg/dL 1.0   Nitrite, UA     Negative  Negative     Legend: ! Abnormal I have reviewed the labs.    Pertinent Imaging:  11/17/21 10:09  Scan Result 141m     Assessment & Plan:    1. BPH with LUTS -PSA stable -DRE benign -PVR < 300 cc -tamsulosin 0.4 mg daily and finasteride 5 mg daily -He is currently on maximum medical therapy for BPH with LUTS and is likely that his untreated sleep apnea is the main culprit concerning his nocturia, he is having increasing PVR, so his prostate may be slightly contributory as well -I explained that undergoing a cystoscopy could determine if his prostate is interfering with his urinary stream and bladder emptying and also we could get a baseline on his current bladder health to see if the bladder shows evidence of working against the pressures of an obstructing prostate -He would like to go undergo the cystoscopy at this time for further evaluation -I did explain to him that if the cystoscopic did reveal findings of B00, he may still not reach his goal concerning his urinary symptoms as he has untreated sleep apnea and is also a diabetic and he is also playing into his pathophysiology -I have explained to the patient that they will  be scheduled for a cystoscopy in our office to evaluate their bladder.  The cystoscopy consists of passing a tube with a lens up through their urethra and into their urinary bladder.   We will inject the urethra with a lidocaine gel prior to introducing the cystoscope to help with any discomfort during the procedure.    After the procedure, they might experience blood in the urine and discomfort with urination.  This will abate after the first few voids.  I have  encouraged the patient to increase water intake  during this time.  Patient denies any allergies to lidocaine.    2. Nocturia -Explained the pathophysiology of nocturia and relationship to his sleep apnea -Encouraged him to continue to try to obtain his CPAP machine   Return for cysto for BOO .  Mace Weinberg, [id number] [place] [phone]

## 2021-11-16 NOTE — Telephone Encounter (Signed)
Called patient to let him know that his Vitamin B12 is low and Dr. Vicente Males wants him to start taking Vitamin B12 1000 mcg daily. I also told him that Dr. Vicente Males wanted me to let his provider at [facility] know. Therefore, I will fax results so he/she will follow up with him. Patient understood and had no further questions.

## 2021-11-17 ENCOUNTER — Ambulatory Visit (INDEPENDENT_AMBULATORY_CARE_PROVIDER_SITE_OTHER): Payer: Medicare Other | Admitting: Urology

## 2021-11-17 ENCOUNTER — Telehealth: Payer: Self-pay

## 2021-11-17 ENCOUNTER — Encounter: Payer: Self-pay | Admitting: Urology

## 2021-11-17 VITALS — BP 136/73 | HR 76 | Ht 68.0 in | Wt 252.0 lb

## 2021-11-17 DIAGNOSIS — R351 Nocturia: Secondary | ICD-10-CM

## 2021-11-17 DIAGNOSIS — N401 Enlarged prostate with lower urinary tract symptoms: Secondary | ICD-10-CM

## 2021-11-17 DIAGNOSIS — N138 Other obstructive and reflux uropathy: Secondary | ICD-10-CM | POA: Diagnosis not present

## 2021-11-17 LAB — BLADDER SCAN AMB NON-IMAGING

## 2021-11-17 MED ORDER — AMOXICILLIN 500 MG PO TABS: 500.0000 mg | ORAL_TABLET | Freq: Two times a day (BID) | ORAL | 0 refills | Status: AC

## 2021-11-17 MED ORDER — CLARITHROMYCIN 500 MG PO TABS: 500.0000 mg | ORAL_TABLET | Freq: Two times a day (BID) | ORAL | 0 refills | Status: AC

## 2021-11-17 MED ORDER — OMEPRAZOLE 20 MG PO CPDR: 20.0000 mg | DELAYED_RELEASE_CAPSULE | Freq: Two times a day (BID) | ORAL | 0 refills | Status: DC

## 2021-11-17 NOTE — Progress Notes (Signed)
Iron very low- refer to hematology for IV iron

## 2021-11-17 NOTE — Telephone Encounter (Signed)
-----   Message from [name], MD sent at 11/17/2021  8:21 AM EDT ----- H pylori positive   Suggest clarithromycin 500 mg PO BID, amoxicillin 1 gram BID, omeprazole 20 mg BID all for 14 days.  ,  will need repeat H pylori stool antigen to check for eradication after .

## 2021-11-17 NOTE — Addendum Note (Signed)
Addended by: Wayna Chalet on: 11/17/2021 04:28 PM   Modules accepted: Orders

## 2021-11-17 NOTE — Progress Notes (Signed)
H pylori positive   Suggest clarithromycin 500 mg PO BID, amoxicillin 1 gram BID, omeprazole 20 mg BID all for 14 days.  ,  will need repeat H pylori stool antigen to check for eradication after .

## 2021-11-17 NOTE — Telephone Encounter (Signed)
Called patient to let him know that he tested H Pylori positive and is needing to start taking antibiotics. Patient agreed.

## 2021-11-22 ENCOUNTER — Inpatient Hospital Stay: Payer: Medicare Other

## 2021-11-22 ENCOUNTER — Inpatient Hospital Stay: Payer: Medicare Other | Attending: Internal Medicine | Admitting: Internal Medicine

## 2021-11-22 ENCOUNTER — Encounter: Payer: Self-pay | Admitting: Internal Medicine

## 2021-11-22 VITALS — BP 147/69 | HR 86 | Temp 97.8°F | Resp 20 | Wt 251.8 lb

## 2021-11-22 DIAGNOSIS — Z87891 Personal history of nicotine dependence: Secondary | ICD-10-CM | POA: Insufficient documentation

## 2021-11-22 DIAGNOSIS — Z79899 Other long term (current) drug therapy: Secondary | ICD-10-CM | POA: Insufficient documentation

## 2021-11-22 DIAGNOSIS — E538 Deficiency of other specified B group vitamins: Secondary | ICD-10-CM | POA: Insufficient documentation

## 2021-11-22 DIAGNOSIS — I10 Essential (primary) hypertension: Secondary | ICD-10-CM | POA: Diagnosis not present

## 2021-11-22 DIAGNOSIS — B9681 Helicobacter pylori [H. pylori] as the cause of diseases classified elsewhere: Secondary | ICD-10-CM | POA: Insufficient documentation

## 2021-11-22 DIAGNOSIS — E119 Type 2 diabetes mellitus without complications: Secondary | ICD-10-CM | POA: Diagnosis not present

## 2021-11-22 DIAGNOSIS — D509 Iron deficiency anemia, unspecified: Secondary | ICD-10-CM | POA: Insufficient documentation

## 2021-11-22 NOTE — Patient Instructions (Signed)
Stop iron pills.  We will start with intravenous iron

## 2021-11-22 NOTE — Progress Notes (Signed)
Randy [name]  [phone] [phone]  ID: Randy [name] OB: 06/27/1950  MR#: [id number]  CSN#:[id number]  Patient Care Team: [name], Randy [name] - General (General Practice)  REFERRING PROVIDER: Dr. Jonathon [name]  REASON FOR REFERRAL: IDA  HPI: Randy [name] is a 71 y.o. male with past medical history of diabetes, hypertension, hyperlipidemia, iron deficiency anemia and H. pylori positive was referred to hematology clinic for further management of iron deficiency anemia.  Patient reports feeling well.  Denies any bleeding in stool,, nose or gum bleeding.  Denies any gastric surgery.  Patient denies fever, chills, nausea, vomiting, shortness of breath, cough, abdominal pain, bleeding, bowel or bladder issues. Energy level is good.  Appetite is good.  Denies any weight loss.  He has been taking oral iron since June 2023.  Iron panel in September 2023 did not show any improvement.  CBC with differential was reviewed.  Patient has anemia since January 2022.  Iron studies not available until recently on 11/09/2021 which showed ferritin of 12 and percent saturation of 8.  Folic acid is normal.  Vitamin B12 is low at 216.  He was started on oral vitamin B12 1000 mcg 2 weeks ago.  He is following with Dr. Vicente [name] of GI and is planned for colonoscopy and endoscopy.  H. pylori breath test was also positive and started on triple drug therapy.  Patient has been taking meloxicam once a day for long time.  He did not know the indication for the drug.  He stopped taking it now.   REVIEW OF SYSTEMS:   ROS  As per HPI. Otherwise, a complete review of systems is negative.  PAST MEDICAL HISTORY: Past Medical History:  Diagnosis Date   Diabetes mellitus without complication ([place])    Hypercholesteremia    Hypertension    Sleep apnea     PAST SURGICAL HISTORY: Past Surgical History:  Procedure Laterality Date   HERNIA REPAIR       FAMILY HISTORY: Family History  Problem Relation Age of Onset   Prostate cancer Neg Hx    Bladder Cancer Neg Hx    Kidney cancer Neg Hx     HEALTH MAINTENANCE: Social History   Tobacco Use   Smoking status: Former    Types: Cigarettes   Smokeless tobacco: Never  Vaping Use   Vaping Use: Never used  Substance Use Topics   Alcohol use: Yes    Comment: infreqently   Drug use: No     No Known Allergies  Current Outpatient Medications  Medication Sig Dispense Refill   ACCU-CHEK AVIVA PLUS test strip      albuterol (VENTOLIN HFA) 108 (90 Base) MCG/ACT inhaler Inhale into the lungs.     allopurinol (ZYLOPRIM) 100 MG tablet Take 1 tablet by mouth daily.     amoxicillin (AMOXIL) 500 MG tablet Take 1 tablet (500 mg total) by mouth 2 (two) times daily for 14 days. 28 tablet 0   aspirin 81 MG tablet Take 81 mg by mouth daily.     atorvastatin (LIPITOR) 40 MG tablet Take 40 mg by mouth daily.     BD INSULIN SYRINGE U/F 31G X 5/16" 0.5 ML MISC 4 (four) times daily.     clarithromycin (BIAXIN) 500 MG tablet Take 1 tablet (500 mg total) by mouth 2 (two) times daily for 14 days. 28 tablet 0   ferrous sulfate 325 (65 FE) MG tablet Take 325 mg by mouth every other  day.     finasteride (PROSCAR) 5 MG tablet TAKE 1 TABLET BY MOUTH DAILY FOR ENLARGED PROSTATE ALONG WITH TAMSULOSIN     fluticasone (FLONASE) 50 MCG/ACT nasal spray Place 2 sprays into both nostrils daily.     insulin glargine (LANTUS) 100 UNIT/ML injection Inject 50 Units into the skin 2 (two) times daily.     insulin lispro (HUMALOG) 100 UNIT/ML injection Inject 30 Units into the skin 3 (three) times daily with meals.     JARDIANCE 10 MG TABS tablet Take 10 mg by mouth daily.     lisinopril (PRINIVIL,ZESTRIL) 10 MG tablet Take 10 mg by mouth daily.     omeprazole (PRILOSEC) 20 MG capsule Take 1 capsule (20 mg total) by mouth 2 (two) times daily before a meal for 14 days. 28 capsule 0   ondansetron (ZOFRAN ODT) 4 MG  disintegrating tablet Take 1 tablet (4 mg total) by mouth every 8 (eight) hours as needed for nausea or vomiting. 20 tablet 0   tamsulosin (FLOMAX) 0.4 MG CAPS capsule TAKE 1 CAPSULE BY MOUTH EVERY DAY 30 MINUTES AFTER THE SAME MEAL     No current facility-administered medications for this visit.    OBJECTIVE: Vitals:   11/22/21 1000  BP: (!) 147/69  Pulse: 86  Resp: 20  Temp: 97.8 F (36.6 C)  SpO2: 98%     Body mass index is 38.29 kg/m.      General: Well-developed, well-nourished, no acute distress. Eyes: Pink conjunctiva, anicteric sclera. HEENT: Normocephalic, moist mucous membranes, clear oropharnyx. Lungs: Clear to auscultation bilaterally. Heart: Regular rate and rhythm. No rubs, murmurs, or gallops. Abdomen: Soft, nontender, nondistended. No organomegaly noted, normoactive bowel sounds. Musculoskeletal: No edema, cyanosis, or clubbing. Neuro: Alert, answering all questions appropriately. Cranial nerves grossly intact. Skin: No rashes or petechiae noted. Psych: Normal affect. Lymphatics: No cervical, calvicular, axillary or inguinal LAD.   LAB RESULTS:  Lab Results  Component Value Date   NA 135 05/19/2021   K 3.0 (L) 05/19/2021   CL 100 05/19/2021   CO2 26 05/19/2021   GLUCOSE 131 (H) 05/19/2021   BUN 10 05/19/2021   CREATININE 0.85 05/19/2021   CALCIUM 8.5 (L) 05/19/2021   PROT 7.4 05/19/2021   ALBUMIN 3.4 (L) 05/19/2021   AST 18 05/19/2021   ALT 12 05/19/2021   ALKPHOS 53 05/19/2021   BILITOT 0.5 05/19/2021   GFRNONAA >60 05/19/2021   GFRAA >60 03/09/2018    Lab Results  Component Value Date   WBC 8.8 05/19/2021   NEUTROABS 5.1 05/19/2021   HGB 10.9 (L) 05/19/2021   HCT 33.9 (L) 05/19/2021   MCV 84.3 05/19/2021   PLT 325 05/19/2021    Lab Results  Component Value Date   TIBC 326 11/09/2021   FERRITIN 12 (L) 11/09/2021   IRONPCTSAT 8 (LL) 11/09/2021     STUDIES: No results found.  ASSESSMENT AND PLAN:   Randy [name] is a 71 y.o.  male with pmh of diabetes, hypertension, hyperlipidemia, iron deficiency anemia and H. pylori positive was referred to hematology clinic for further management of iron deficiency anemia.  # Iron deficiency anemia  -Progressive in nature.  Not responsive to oral iron. -CBC with differential was reviewed.  Patient has anemia since January 2022.  Iron studies available until recently on 11/09/2021 which showed ferritin of 12 and percent saturation of 8. -IV Feraheme 550 mg x 2.  Discussed about low but potential risk of anaphylactic reaction. -Can stop oral iron. -Following with  Dr. Vicente [name] of GI and scheduled for endoscopy and colonoscopy to assess for any source of bleeding.  Patient has been taking meloxicam once a day for long time.  He did not know the indication for the drug.  He stopped taking it now. -UA negative for microscopic hematuria.  # B12 deficiency -B12 level from 11/09/2021 216 -Celiac panel was negative -He was started on oral b12 1000 mcg 2 weeks ago by GI.  We will repeat B12 level in 2 to 3 months to assess for response.  If not responding will do IM B12.  # H pylori positive -Started on triple drug therapy on 11/17/2021 by GI.  Orders Placed This Encounter  Procedures   CBC with Differential   Iron and TIBC([facility])   Ferritin   Vitamin B12    RTC in 10 weeks for MD visit, labs prior.  Patient expressed understanding and was in agreement with this plan. He also understands that He can call clinic at any time with any questions, concerns, or complaints.   I spent a total of 45 minutes reviewing chart data, face-to-face evaluation with the patient, counseling and coordination of care as detailed above.  Jane Canary, MD   11/22/2021 10:34 AM

## 2021-11-23 ENCOUNTER — Encounter: Payer: Self-pay | Admitting: Gastroenterology

## 2021-11-24 ENCOUNTER — Encounter: Admission: RE | Payer: Self-pay | Source: Home / Self Care

## 2021-11-24 ENCOUNTER — Ambulatory Visit: Admission: RE | Admit: 2021-11-24 | Payer: Medicare Other | Source: Home / Self Care | Admitting: Gastroenterology

## 2021-11-24 SURGERY — COLONOSCOPY WITH PROPOFOL
Anesthesia: General

## 2021-11-26 ENCOUNTER — Other Ambulatory Visit: Payer: Medicare Other | Admitting: Urology

## 2021-12-02 ENCOUNTER — Inpatient Hospital Stay: Payer: Medicare Other | Attending: Internal Medicine

## 2021-12-02 VITALS — BP 137/73 | HR 85 | Temp 96.5°F | Resp 18

## 2021-12-02 DIAGNOSIS — D509 Iron deficiency anemia, unspecified: Secondary | ICD-10-CM | POA: Diagnosis present

## 2021-12-02 MED ORDER — SODIUM CHLORIDE 0.9 % IV SOLN
510.0000 mg | Freq: Once | INTRAVENOUS | Status: AC
  Administered 2021-12-02: 510 mg via INTRAVENOUS
  Filled 2021-12-02: qty 510

## 2021-12-02 MED ORDER — SODIUM CHLORIDE 0.9 % IV SOLN
Freq: Once | INTRAVENOUS | Status: AC
  Filled 2021-12-02: qty 250

## 2021-12-02 NOTE — Patient Instructions (Signed)
[facility]  Discharge Instructions: Thank you for choosing [facility] to provide your oncology and hematology care.  If you have a lab appointment with the Cancer Center, please go directly to the Cancer Center and check in at the registration area.  Wear comfortable clothing and clothing appropriate for easy access to any Portacath or PICC line.   We strive to give you quality time with your provider. You may need to reschedule your appointment if you arrive late (15 or more minutes).  Arriving late affects you and other patients whose appointments are after yours.  Also, if you miss three or more appointments without notifying the office, you may be dismissed from the clinic at the provider's discretion.      For prescription refill requests, have your pharmacy contact our office and allow 72 hours for refills to be completed.    Today you received the following chemotherapy and/or immunotherapy agents FERAHEME      To help prevent nausea and vomiting after your treatment, we encourage you to take your nausea medication as directed.  BELOW ARE SYMPTOMS THAT SHOULD BE REPORTED IMMEDIATELY: *FEVER GREATER THAN 100.4 F (38 C) OR HIGHER *CHILLS OR SWEATING *NAUSEA AND VOMITING THAT IS NOT CONTROLLED WITH YOUR NAUSEA MEDICATION *UNUSUAL SHORTNESS OF BREATH *UNUSUAL BRUISING OR BLEEDING *URINARY PROBLEMS (pain or burning when urinating, or frequent urination) *BOWEL PROBLEMS (unusual diarrhea, constipation, pain near the anus) TENDERNESS IN MOUTH AND THROAT WITH OR WITHOUT PRESENCE OF ULCERS (sore throat, sores in mouth, or a toothache) UNUSUAL RASH, SWELLING OR PAIN  UNUSUAL VAGINAL DISCHARGE OR ITCHING   Items with * indicate a potential emergency and should be followed up as soon as possible or go to the Emergency Department if any problems should occur.  Please show the CHEMOTHERAPY ALERT CARD or IMMUNOTHERAPY ALERT CARD at check-in to  the Emergency Department and triage nurse.  Should you have questions after your visit or need to cancel or reschedule your appointment, please contact [facility]  [phone] and follow the prompts.  Office hours are 8:00 a.m. to 4:30 p.m. Monday - Friday. Please note that voicemails left after 4:00 p.m. may not be returned until the following business day.  We are closed weekends and major holidays. You have access to a nurse at all times for urgent questions. Please call the main number to the clinic [phone] and follow the prompts.  For any non-urgent questions, you may also contact your provider using MyChart. We now offer e-Visits for anyone 18 and older to request care online for non-urgent symptoms. For details visit [email or web address].   Also download the MyChart app! Go to the app store, search "MyChart", open the app, select [facility], and log in with your MyChart username and password.  Masks are optional in the cancer centers. If you would like for your care team to wear a mask while they are taking care of you, please let them know. For doctor visits, patients may have with them one support person who is at least 70 years old. At this time, visitors are not allowed in the infusion area.  Ferumoxytol Injection What is this medication? FERUMOXYTOL (FER ue MOX i tol) treats low levels of iron in your body (iron deficiency anemia). Iron is a mineral that plays an important role in making red blood cells, which carry oxygen from your lungs to the rest of your body. This medicine may be used for other   purposes; ask your health care provider or pharmacist if you have questions. COMMON BRAND NAME(S): Feraheme What should I tell my care team before I take this medication? They need to know if you have any of these conditions: Anemia not caused by low iron levels High levels of iron in the blood Magnetic resonance imaging (MRI) test scheduled An  unusual or allergic reaction to iron, other medications, foods, dyes, or preservatives Pregnant or trying to get pregnant Breast-feeding How should I use this medication? This medication is for injection into a vein. It is given in a hospital or clinic setting. Talk to your care team the use of this medication in children. Special care may be needed. Overdosage: If you think you have taken too much of this medicine contact a poison control center or emergency room at once. NOTE: This medicine is only for you. Do not share this medicine with others. What if I miss a dose? It is important not to miss your dose. Call your care team if you are unable to keep an appointment. What may interact with this medication? Other iron products This list may not describe all possible interactions. Give your health care provider a list of all the medicines, herbs, non-prescription drugs, or dietary supplements you use. Also tell them if you smoke, drink alcohol, or use illegal drugs. Some items may interact with your medicine. What should I watch for while using this medication? Visit your care team regularly. Tell your care team if your symptoms do not start to get better or if they get worse. You may need blood work done while you are taking this medication. You may need to follow a special diet. Talk to your care team. Foods that contain iron include: whole grains/cereals, dried fruits, beans, or peas, leafy green vegetables, and organ meats (liver, kidney). What side effects may I notice from receiving this medication? Side effects that you should report to your care team as soon as possible: Allergic reactions--skin rash, itching, hives, swelling of the face, lips, tongue, or throat Low blood pressure--dizziness, feeling faint or lightheaded, blurry vision Shortness of breath Side effects that usually do not require medical attention (report to your care team if they continue or are  bothersome): Flushing Headache Joint pain Muscle pain Nausea Pain, redness, or irritation at injection site This list may not describe all possible side effects. Call your doctor for medical advice about side effects. You may report side effects to FDA at 1-800-FDA-1088. Where should I keep my medication? This medication is given in a hospital or clinic and will not be stored at home. NOTE: This sheet is a summary. It may not cover all possible information. If you have questions about this medicine, talk to your doctor, pharmacist, or health care provider.  2023 Elsevier/Gold Standard (2020-07-10 00:00:00)    

## 2021-12-09 ENCOUNTER — Inpatient Hospital Stay: Payer: Medicare Other

## 2021-12-09 VITALS — BP 130/55 | HR 81 | Temp 97.6°F

## 2021-12-09 DIAGNOSIS — D509 Iron deficiency anemia, unspecified: Secondary | ICD-10-CM

## 2021-12-09 MED ORDER — SODIUM CHLORIDE 0.9 % IV SOLN
Freq: Once | INTRAVENOUS | Status: AC
  Filled 2021-12-09: qty 250

## 2021-12-09 MED ORDER — SODIUM CHLORIDE 0.9 % IV SOLN
510.0000 mg | Freq: Once | INTRAVENOUS | Status: AC
  Administered 2021-12-09: 510 mg via INTRAVENOUS
  Filled 2021-12-09: qty 510

## 2021-12-10 ENCOUNTER — Other Ambulatory Visit: Payer: Medicare Other | Admitting: Urology

## 2021-12-16 ENCOUNTER — Encounter: Payer: Self-pay | Admitting: Urology

## 2021-12-16 ENCOUNTER — Ambulatory Visit (INDEPENDENT_AMBULATORY_CARE_PROVIDER_SITE_OTHER): Payer: Medicare Other | Admitting: Urology

## 2021-12-16 VITALS — BP 156/87 | HR 71 | Ht 68.0 in | Wt 250.0 lb

## 2021-12-16 DIAGNOSIS — N138 Other obstructive and reflux uropathy: Secondary | ICD-10-CM

## 2021-12-16 DIAGNOSIS — R351 Nocturia: Secondary | ICD-10-CM

## 2021-12-16 DIAGNOSIS — N401 Enlarged prostate with lower urinary tract symptoms: Secondary | ICD-10-CM | POA: Diagnosis not present

## 2021-12-16 LAB — URINALYSIS, COMPLETE
Bilirubin, UA: NEGATIVE
Glucose, UA: NEGATIVE
Ketones, UA: NEGATIVE
Leukocytes,UA: NEGATIVE
Nitrite, UA: NEGATIVE
Protein,UA: NEGATIVE
RBC, UA: NEGATIVE
Specific Gravity, UA: 1.015 (ref 1.005–1.030)
Urobilinogen, Ur: 1 mg/dL (ref 0.2–1.0)
pH, UA: 7 (ref 5.0–7.5)

## 2021-12-16 LAB — MICROSCOPIC EXAMINATION: Bacteria, UA: NONE SEEN

## 2021-12-16 NOTE — Progress Notes (Signed)
12/16/21  Cysto/ TRUS    HPI: 71 y.o. year-old male with refractory urinary symptoms related to BPH who presents today to the office for cystoscopy and prostate sizing.   Please see previous notes for details.     There were no vitals taken for this visit. NED. A&Ox3.   No respiratory distress   Abd soft, NT, ND Normal phallus with bilateral descended testicles    Cystoscopy Procedure Note  Patient identification was confirmed, informed consent was obtained, and patient was prepped using Betadine solution.  Lidocaine jelly was administered per urethral meatus.    Preoperative abx where received prior to procedure.     Pre-Procedure: - Inspection reveals a normal caliber ureteral meatus.  Procedure: The flexible cystoscope was introduced without difficulty - No urethral strictures/lesions are present. - Enlarged prostate bilobar coaptation - Normal bladder neck - Bilateral ureteral orifices identified - Bladder mucosa  reveals no ulcers, tumors, or lesions - No bladder stones - No trabeculation  Retroflexion shows slight intravesical protrusion of kissing lateral lobes, no median lobe   Post-Procedure: - Patient tolerated the procedure well   Prostate transrectal ultrasound sizing   Informed consent was obtained after discussing risks/benefits of the procedure.  A time out was performed to ensure correct patient identity.   Pre-Procedure: -Transrectal probe was placed without difficulty -Transrectal Ultrasound performed revealing a 83 gm prostate measuring 5.03 x 5.13 x 6.11 cm (length) -No significant hypoechoic or median lobe noted      Assessment/ Plan:   1. BPH with obstruction/lower urinary tract symptoms Refractory symptoms on maximal medical therapy including Flomax and finasteride  He does have significant prostamegaly with kissing lateral lobes although minimal trabeculation which is reassuring  His primary complaint is nocturia x5 and is working  to get a CPAP, which strongly recommend he follow through with this and then reassess his urinary symptoms prior to consideration of outlet procedure  He may be a good candidate for TURP versus HoLEP.  Suboptimal candidate for UroLift given his prostate volume although this procedure could be considered for patients up to 100 g.  We will have him follow-up with [facility] about 3 months after he is reinitiated his CPAP therapy to reassess his urinary symptoms and have further discussion of the above.  He was provided literature about TURP and HoLEP today to help facilitate his conversation moving forward if her symptoms fail to improve. - Urinalysis, Complete  2. Nocturia As above - Urinalysis, Complete  Return in about 3 months (around 03/18/2022) for f/u [facility] with IPSS/ PVR .    Hollice Espy, MD

## 2021-12-16 NOTE — Patient Instructions (Signed)
Transurethral Resection of the Prostate ?Transurethral resection of the prostate (TURP) is the removal, or resection, of part of the prostate tissue. This procedure is done to treat an enlarged prostate gland (benign prostatic hyperplasia). ?The goal of TURP is to remove enough prostate tissue to allow for a normal flow of urine. The procedure will allow you to empty your bladder more completely when you urinate so that you can urinate less often. ?In a transurethral resection, a thin telescope with a light, a camera, and an electric cutting edge (resectoscope) is passed through the urethra and into the prostate. The opening of the urethra is at the end of the penis. ?Tell a health care provider about: ?Any allergies you have. ?All medicines you are taking, including vitamins, herbs, eye drops, creams, and over-the-counter medicines. ?Any problems you or family members have had with anesthetic medicines. ?Any bleeding problems you have. ?Any surgeries you have had. ?Any medical conditions you have. ?Any prostate infections you have had. ?What are the risks? ?Generally, this is a safe procedure. However, problems may occur, including: ?Infection. ?Bleeding. ?Allergic reactions to medicines. ?Blood in the urine (hematuria). ?Damage to nearby structures or organs. ?Other problems may occur, but they are rare. They include: ?Dry ejaculation, or having no semen come out during orgasm. ?Erectile dysfunction, or being unable to have or keep an erection. ?Scarring that leads to narrowing of the urethra. This narrowing may block the flow of urine. ?Inability to control when you urinate (incontinence). ?Deep vein thrombosis. This is a blood clot that can develop in your leg. ?TURP syndrome. This can happen when you lose too much sodium during or after the procedure. Some signs and symptoms of this condition include: ?Weakness. ?Headaches. ?Nausea or vomiting. ?Muscle cramping. ?What happens before the procedure? ?When to stop  eating and drinking ?Follow instructions from your health care provider about what you may eat and drink before your procedure. These may include: ?8 hours before your procedure ?Stop eating most foods. Do not eat meat, fried foods, or fatty foods. ?Eat only light foods, such as toast or crackers. ?All liquids are okay except energy drinks and alcohol. ?6 hours before your procedure ?Stop eating. ?Drink only clear liquids, such as water, clear fruit juice, black coffee, plain tea, and sports drinks. ?Do not drink energy drinks or alcohol. ?2 hours before your procedure ?Stop drinking all liquids. ?You may be allowed to take medicines with small sips of water. ?If you do not follow your health care provider's instructions, your procedure may be delayed or canceled. ?Medicines ?Ask your health care provider about: ?Changing or stopping your regular medicines. This is especially important if you are taking diabetes medicines or blood thinners. ?Taking medicines such as aspirin and ibuprofen. These medicines can thin your blood. Do not take these medicines unless your health care provider tells you to take them. ?Taking over-the-counter medicines, vitamins, herbs, and supplements. ?Surgery safety ?Ask your health care provider what steps will be taken to help prevent infection. These steps may include: ?Removing hair at the surgery site. ?Washing skin with a germ-killing soap. ?Taking antibiotic medicine. ?General instructions ?Do not use any products that contain nicotine or tobacco for at least 4 weeks before the procedure. These products include cigarettes, chewing tobacco, and vaping devices, such as e-cigarettes. If you need help quitting, ask your health care provider. ?If you will be going home right after the procedure, plan to have a responsible adult: ?Take you home from the hospital or clinic. You   will not be allowed to drive. Care for you for the time you are told. What happens during the procedure?  An  IV will be inserted into one of your veins. You will be given one or more of the following: A medicine to help you relax (sedative). A medicine to make you fall asleep (general anesthetic). A medicine that is injected into your spine to numb the area below and slightly above the injection site (spinal anesthetic). Your legs will be placed in foot rests (stirrups) so that your legs are apart and your knees are bent. The resectoscope will be passed through your urethra to your prostate. Parts of your prostate will be resected using the cutting edge of the resectoscope. Fluid will be passed to rinse out the cut tissues (irrigation). The resectoscope will be removed. A small, thin tube (catheter) will be passed through your urethra and into your bladder. The catheter will drain urine into a bag outside of your body. The procedure may vary among health care providers and hospitals. What happens after the procedure? Your blood pressure, heart rate, breathing rate, and blood oxygen level will be monitored until you leave the hospital or clinic. You will be given fluids through the IV. The IV will be removed when you start eating and drinking normally. You may have some pain. Pain medicine will be available to help you. You will have a catheter draining your urine. You may have blood in your urine. Your catheter may be kept in until your urine is clear. Your urinary drainage will be monitored. If necessary, your bladder may be rinsed out (irrigated) through your catheter. You will be encouraged to walk around as soon as possible. You may have to wear compression stockings. These stockings help to prevent blood clots and reduce swelling in your legs. If you were given a sedative during the procedure, it can affect you for several hours. Do not drive or operate machinery until your health care provider says that it is safe. Summary Transurethral resection of the prostate (TURP) is the removal  (resection) of part of the prostate tissue. The goal of this procedure is to remove enough prostate tissue to allow for a normal flow of urine. Follow instructions from your health care provider about taking medicines and about eating and drinking before the procedure. This information is not intended to replace advice given to you by your health care provider. Make sure you discuss any questions you have with your health care provider. Document Revised: 11/10/2020 Document Reviewed: 11/10/2020 Elsevier Patient Education  [place].  Holmium Laser Enucleation of the Prostate (HoLEP)  HoLEP is a treatment for men with benign prostatic hyperplasia (BPH). The laser surgery removed blockages of urine flow, and is done without any incisions on the body.     What is HoLEP?  HoLEP is a type of laser surgery used to treat obstruction (blockage) of urine flow as a result of benign prostatic hyperplasia (BPH). In men with BPH, the prostate gland is not cancerous, but has become enlarged. An enlarged prostate can result in a number of urinary tract symptoms such as weak urinary stream, difficulty in starting urination, inability to urinate, frequent urination, or getting up at night to urinate.  HoLEP was developed in the 1990's as a more effective and less expensive surgical option for BPH, compared to other surgical options such as laser vaporization(PVP/greenlight laser), transurethral resection of the prostate(TURP), and open simple prostatectomy.   What happens during a HoLEP?  HoLEP requires general anesthesia ("asleep" throughout the procedure).   An antibiotic is given to reduce the risk of infection  A surgical instrument called a resectoscope is inserted through the urethra (the tube that carries urine from the bladder). The resectoscope has a camera that allows the surgeon to view the internal structure of the prostate gland, and to see where the incisions are being made during  surgery.  The laser is inserted into the resectoscope and is used to enucleate (free up) the enlarged prostate tissue from the capsule (outer shell) and then to seal up any blood vessels. The tissue that has been removed is pushed back into the bladder.  A morcellator is placed through the resectoscope, and is used to suction out the prostate tissue that has been pushed into the bladder.  When the prostate tissue has been removed, the resectoscope is removed, and a foley catheter is placed to allow healing and drain the urine from the bladder.     What happens after a HoLEP?  More than 90% of patients go home the same day a few hours after surgery. Less than 10% will be admitted to the hospital overnight for observation to monitor the urine, or if they have other medical problems.  Fluid is flushed through the catheter for about 1 hour after surgery to clear any blood from the urine. It is normal to have some blood in the urine after surgery. The need for blood transfusion is extremely rare.  Eating and drinking are permitted after the procedure once the patient has fully awakened from anesthesia.  The catheter is usually removed 2-3 days after surgery- the patient will come to clinic to have the catheter removed and make sure they can urinate on their own.  It is very important to drink lots of fluids after surgery for one week to keep the bladder flushed.  At first, there may be some burning with urination, but this typically improved within a few hours to days. Most patients do not have a significant amount of pain, and narcotic pain medications are rarely needed.  Symptoms of urinary frequency, urgency, and even leakage are NORMAL for the first few weeks after surgery as the bladder adjusts after having to work hard against blockage from the prostate for many years. This will improve, but can sometimes take several months.  The use of pelvic floor exercises (Kegel exercises) can help  improve problems with urinary incontinence.   After catheter removal, patients will be seen at 6 weeks and 6 months for symptom check  No heavy lifting for at least 2-3 weeks after surgery, however patients can walk and do light activities the first day after surgery. Return to work time depends on occupation.    What are the advantages of HoLEP?  HoLEP has been studied in many different parts of the world and has been shown to be a safe and effective procedure. Although there are many types of BPH surgeries available, HoLEP offers a unique advantage in being able to remove a large amount of tissue without any incisions on the body, even in very large prostates, while decreasing the risk of bleeding and providing tissue for pathology (to look for cancer). This decreases the need for blood transfusions during surgery, minimizes hospital stay, and reduces the risk of needing repeat treatment.  What are the side effects of HoLEP?  Temporary burning and bleeding during urination. Some blood may be seen in the urine for weeks after surgery and is part  of the healing process.  Urinary incontinence (inability to control urine flow) is expected in all patients immediately after surgery and they should wear pads for the first few days/weeks. This typically improves over the course of several weeks. Performing Kegel exercises can help decrease leakage from stress maneuvers such as coughing, sneezing, or lifting. The rate of long term leakage is very low. Patients may also have leakage with urgency and this may be treated with medication. The risk of urge incontinence can be dependent on several factors including age, prostate size, symptoms, and other medical problems.  Retrograde ejaculation or "backwards ejaculation." In 75% of cases, the patient will not see any fluid during ejaculation after surgery.  Erectile function is generally not significantly affected.   What are the risks of HoLEP?  Injury  to the urethra or development of scar tissue at a later date  Injury to the capsule of the prostate (typically treated with longer catheterization).  Injury to the bladder or ureteral orifices (where the urine from the kidney drains out)  Infection of the bladder, testes, or kidneys  Return of urinary obstruction at a later date requiring another operation (<2%)  Need for blood transfusion or re-operation due to bleeding  Failure to relieve all symptoms and/or need for prolonged catheterization after surgery  5-15% of patients are found to have previously undiagnosed prostate cancer in their specimen. Prostate cancer can be treated after HoLEP.  Standard risks of anesthesia including blood clots, heart attacks, etc  When should I call my doctor?  Fever over 101.3 degrees  Inability to urinate, or large blood clots in the urine

## 2022-01-03 ENCOUNTER — Encounter: Payer: Self-pay | Admitting: Gastroenterology

## 2022-01-04 ENCOUNTER — Ambulatory Visit: Payer: Medicare Other | Admitting: Certified Registered"

## 2022-01-04 ENCOUNTER — Ambulatory Visit
Admission: RE | Admit: 2022-01-04 | Discharge: 2022-01-04 | Disposition: A | Discharge status: Home / Self Care | Payer: Medicare Other | Attending: Gastroenterology | Admitting: Gastroenterology

## 2022-01-04 ENCOUNTER — Encounter
Admission: RE | Disposition: A | Discharge status: Home / Self Care | Payer: Self-pay | Source: Home / Self Care | Attending: Gastroenterology

## 2022-01-04 DIAGNOSIS — I10 Essential (primary) hypertension: Secondary | ICD-10-CM | POA: Insufficient documentation

## 2022-01-04 DIAGNOSIS — D508 Other iron deficiency anemias: Secondary | ICD-10-CM

## 2022-01-04 DIAGNOSIS — Z87891 Personal history of nicotine dependence: Secondary | ICD-10-CM | POA: Diagnosis not present

## 2022-01-04 DIAGNOSIS — D509 Iron deficiency anemia, unspecified: Secondary | ICD-10-CM | POA: Diagnosis present

## 2022-01-04 DIAGNOSIS — D126 Benign neoplasm of colon, unspecified: Secondary | ICD-10-CM | POA: Diagnosis not present

## 2022-01-04 DIAGNOSIS — D759 Disease of blood and blood-forming organs, unspecified: Secondary | ICD-10-CM | POA: Diagnosis not present

## 2022-01-04 DIAGNOSIS — K31811 Angiodysplasia of stomach and duodenum with bleeding: Secondary | ICD-10-CM | POA: Diagnosis not present

## 2022-01-04 DIAGNOSIS — G4730 Sleep apnea, unspecified: Secondary | ICD-10-CM | POA: Diagnosis not present

## 2022-01-04 DIAGNOSIS — D122 Benign neoplasm of ascending colon: Secondary | ICD-10-CM | POA: Insufficient documentation

## 2022-01-04 DIAGNOSIS — E119 Type 2 diabetes mellitus without complications: Secondary | ICD-10-CM | POA: Insufficient documentation

## 2022-01-04 DIAGNOSIS — K5520 Angiodysplasia of colon without hemorrhage: Secondary | ICD-10-CM | POA: Diagnosis not present

## 2022-01-04 HISTORY — PX: ESOPHAGOGASTRODUODENOSCOPY: SHX5428

## 2022-01-04 HISTORY — PX: COLONOSCOPY WITH PROPOFOL: SHX5780

## 2022-01-04 LAB — GLUCOSE, CAPILLARY: Glucose-Capillary: 109 mg/dL — ABNORMAL HIGH (ref 70–99)

## 2022-01-04 SURGERY — EGD (ESOPHAGOGASTRODUODENOSCOPY)
Anesthesia: General

## 2022-01-04 MED ORDER — VASOPRESSIN 20 UNIT/ML IV SOLN
INTRAVENOUS | Status: DC | PRN
  Administered 2022-01-04 (×2): 2 [IU] via INTRAVENOUS

## 2022-01-04 MED ORDER — OMEPRAZOLE 20 MG PO CPDR: 20.0000 mg | DELAYED_RELEASE_CAPSULE | Freq: Two times a day (BID) | ORAL | 0 refills | Status: DC

## 2022-01-04 MED ORDER — LIDOCAINE HCL (CARDIAC) PF 100 MG/5ML IV SOSY
PREFILLED_SYRINGE | INTRAVENOUS | Status: DC | PRN
  Administered 2022-01-04: 100 mg via INTRAVENOUS

## 2022-01-04 MED ORDER — SODIUM CHLORIDE 0.9 % IV SOLN
INTRAVENOUS | Status: DC
  Administered 2022-01-04: 20 mL/h via INTRAVENOUS

## 2022-01-04 MED ORDER — PROPOFOL 500 MG/50ML IV EMUL
INTRAVENOUS | Status: DC | PRN
  Administered 2022-01-04: 155 ug/kg/min via INTRAVENOUS

## 2022-01-04 MED ORDER — PROPOFOL 10 MG/ML IV BOLUS
INTRAVENOUS | Status: DC | PRN
  Administered 2022-01-04: 70 mg via INTRAVENOUS

## 2022-01-04 MED ORDER — PHENYLEPHRINE 80 MCG/ML (10ML) SYRINGE FOR IV PUSH (FOR BLOOD PRESSURE SUPPORT)
PREFILLED_SYRINGE | INTRAVENOUS | Status: DC | PRN
  Administered 2022-01-04: 240 ug via INTRAVENOUS
  Administered 2022-01-04 (×3): 160 ug via INTRAVENOUS

## 2022-01-04 MED ORDER — GLYCOPYRROLATE 0.2 MG/ML IJ SOLN
INTRAMUSCULAR | Status: DC | PRN
  Administered 2022-01-04: .2 mg via INTRAVENOUS

## 2022-01-04 NOTE — Op Note (Signed)
[facility] Gastroenterology Patient Name: Randy [name] Procedure Date: 01/04/2022 9:23 AM MRN: [id number] Account #: [id number] Date of Birth: November 15, 1950 Admit Type: Outpatient Age: 71 Room: [facility] ENDO ROOM 2 Gender: Male Note Status: Finalized Instrument Name: Jasper Riling [id number] Procedure:             Colonoscopy Indications:           Iron deficiency anemia Providers:             [name] MD, MD Referring MD:          Bridget Hartshorn j. [facility], dr (Referring MD) Medicines:             Monitored Anesthesia Care Complications:         No immediate complications. Procedure:             Pre-Anesthesia Assessment:                        - Prior to the procedure, a History and Physical was                         performed, and patient medications, allergies and                         sensitivities were reviewed. The patient's tolerance                         of previous anesthesia was reviewed.                        - The risks and benefits of the procedure and the                         sedation options and risks were discussed with the                         patient. All questions were answered and informed                         consent was obtained.                        - After reviewing the risks and benefits, the patient                         was deemed in satisfactory condition to undergo the                         procedure.                        - ASA Grade Assessment: II - A patient with mild                         systemic disease.                        After obtaining informed consent, the colonoscope was                         passed under direct vision.  Throughout the procedure,                         the patient's blood pressure, pulse, and oxygen                         saturations were monitored continuously. The                         Colonoscope was introduced through the anus and                         advanced to the  the cecum, identified by the                         appendiceal orifice. The colonoscopy was performed                         with ease. The patient tolerated the procedure well.                         The quality of the bowel preparation was good. The                         appendiceal orifice was photographed. Findings:      The perianal and digital rectal examinations were normal.      Two sessile polyps were found in the ascending colon. The polyps were 4       to 5 mm in size. These polyps were removed with a cold snare. Resection       and retrieval were complete.      A single small localized angioectasia without bleeding was found in the       cecum. Coagulation for bleeding prevention using argon plasma at 0.5       liters/minute and 20 watts was successful.      The exam was otherwise without abnormality on direct and retroflexion       views. Impression:            - Two 4 to 5 mm polyps in the ascending colon, removed                         with a cold snare. Resected and retrieved.                        - A single non-bleeding colonic angioectasia. Treated                         with argon plasma coagulation (APC).                        - The examination was otherwise normal on direct and                         retroflexion views. Recommendation:        - Discharge patient to home (with escort).                        - Resume previous diet.                        -  Continue present medications.                        - Await pathology results.                        - Repeat colonoscopy for surveillance based on                         pathology results.                        - To visualize the small bowel, perform video capsule                         endoscopy in 2 weeks. Procedure Code(s):     --- Professional ---                        [id number], 59, Colonoscopy, flexible; with control of                         bleeding, any method                         45385, Colonoscopy, flexible; with removal of                         tumor(s), polyp(s), or other lesion(s) by snare                         technique Diagnosis Code(s):     --- Professional ---                        D12.2, Benign neoplasm of ascending colon                        K55.20, Angiodysplasia of colon without hemorrhage                        D50.9, Iron deficiency anemia, unspecified CPT copyright 2022 American Medical Association. All rights reserved. The codes documented in this report are preliminary and upon coder review may  be revised to meet current compliance requirements. [name], MD [name] MD, MD 01/04/2022 10:12:18 AM This report has been signed electronically. Number of Addenda: 0 Note Initiated On: 01/04/2022 9:23 AM Scope Withdrawal Time: 0 hours 10 minutes 49 seconds  Total Procedure Duration: 0 hours 12 minutes 1 second  Estimated Blood Loss:  Estimated blood loss: none.      [facility]

## 2022-01-04 NOTE — Anesthesia Procedure Notes (Signed)
Procedure Name: General with mask airway Date/Time: 01/04/2022 9:51 AM  Performed by: Kelton Pillar, CRNAPre-anesthesia Checklist: Patient identified, Emergency Drugs available, Suction available and Patient being monitored Patient Re-evaluated:Patient Re-evaluated prior to induction Oxygen Delivery Method: Simple face mask Induction Type: IV induction Placement Confirmation: positive ETCO2, CO2 detector and breath sounds checked- equal and bilateral Dental Injury: Teeth and Oropharynx as per pre-operative assessment

## 2022-01-04 NOTE — Op Note (Signed)
[facility] Gastroenterology Patient Name: Randy [name] Procedure Date: 01/04/2022 9:24 AM MRN: [id number] Account #: [id number] Date of Birth: 15-Jul-1950 Admit Type: Outpatient Age: 71 Room: [facility] ENDO ROOM 2 Gender: Male Note Status: Finalized Instrument Name: Upper Endoscope [id number] Procedure:             Upper GI endoscopy Indications:           Iron deficiency anemia Providers:             [name] MD, MD Referring MD:          Bridget Hartshorn j. [facility], dr (Referring MD) Medicines:             Monitored Anesthesia Care Complications:         No immediate complications. Procedure:             Pre-Anesthesia Assessment:                        - Prior to the procedure, a History and Physical was                         performed, and patient medications, allergies and                         sensitivities were reviewed. The patient's tolerance                         of previous anesthesia was reviewed.                        - The risks and benefits of the procedure and the                         sedation options and risks were discussed with the                         patient. All questions were answered and informed                         consent was obtained.                        - ASA Grade Assessment: II - A patient with mild                         systemic disease.                        After obtaining informed consent, the endoscope was                         passed under direct vision. Throughout the procedure,                         the patient's blood pressure, pulse, and oxygen                         saturations were monitored continuously. The Endoscope  was introduced through the mouth, and advanced to the                         third part of duodenum. The upper GI endoscopy was                         accomplished with ease. The patient tolerated the                         procedure well. Findings:       The esophagus was normal.      A single small angioectasia with no bleeding was found on the greater       curvature of the stomach. Coagulation for bleeding prevention using       argon beam at 0.5 liters/minute and 20 watts was successful.      Multiple medium angioectasias with bleeding were found in the first       portion of the duodenum, in the second portion of the duodenum and in       the third portion of the duodenum. Coagulation for bleeding prevention       using argon plasma at 0.5 liters/minute and 20 watts was successful.      The cardia and gastric fundus were normal on retroflexion. Impression:            - Normal esophagus.                        - A single non-bleeding angioectasia in the stomach.                         Treated with argon beam coagulation.                        - Multiple bleeding angioectasias in the duodenum.                         Treated with argon plasma coagulation (APC).                        - No specimens collected. Recommendation:        - Perform a colonoscopy today.                        - Use Prilosec (omeprazole) 40 mg PO daily for 3                         months. Procedure Code(s):     --- Professional ---                        [phone], Esophagogastroduodenoscopy, flexible,                         transoral; with control of bleeding, any method Diagnosis Code(s):     --- Professional ---                        X44.818, Angiodysplasia of stomach and duodenum with  bleeding                        D50.9, Iron deficiency anemia, unspecified CPT copyright 2022 American Medical Association. All rights reserved. The codes documented in this report are preliminary and upon coder review may  be revised to meet current compliance requirements. [name], MD [name] MD, MD 01/04/2022 9:55:29 AM This report has been signed electronically. Number of Addenda: 0 Note Initiated On: 01/04/2022 9:24 AM Estimated Blood  Loss:  Estimated blood loss: none.      [facility]

## 2022-01-04 NOTE — Transfer of Care (Signed)
Immediate Anesthesia Transfer of Care Note  Patient: Randy [name]  Procedure(s) Performed: ESOPHAGOGASTRODUODENOSCOPY (EGD) COLONOSCOPY WITH PROPOFOL  Patient Location: Endoscopy Unit  Anesthesia Type:General  Level of Consciousness: drowsy and patient cooperative  Airway & Oxygen Therapy: Patient Spontanous Breathing and Patient connected to face mask oxygen  Post-op Assessment: Report given to RN and Post -op Vital signs reviewed and stable  Post vital signs: Reviewed and stable  Last Vitals:  Vitals Value Taken Time  BP 94/51 01/04/22 1020  Temp 35.7 C 01/04/22 1015  Pulse 70 01/04/22 1020  Resp 0 01/04/22 1020  SpO2 98 % 01/04/22 1020  Vitals shown include unvalidated device data.  Last Pain:  Vitals:   01/04/22 1015  TempSrc: Temporal  PainSc: Asleep         Complications: No notable events documented.

## 2022-01-04 NOTE — Anesthesia Preprocedure Evaluation (Signed)
Anesthesia Evaluation  Patient identified by MRN, date of birth, ID band Patient awake    Reviewed: Allergy & Precautions, H&P , NPO status , Patient's Chart, lab work & pertinent test results, reviewed documented beta blocker date and time   Airway Mallampati: II   Neck ROM: full    Dental  (+) Poor Dentition   Pulmonary sleep apnea , former smoker   Pulmonary exam normal        Cardiovascular Exercise Tolerance: Poor hypertension, On Medications negative cardio ROS Normal cardiovascular exam Rhythm:regular Rate:Normal     Neuro/Psych  Neuromuscular disease  negative psych ROS   GI/Hepatic negative GI ROS, Neg liver ROS,,,  Endo/Other  negative endocrine ROSdiabetes    Renal/GU negative Renal ROS  negative genitourinary   Musculoskeletal   Abdominal   Peds  Hematology  (+) Blood dyscrasia, anemia   Anesthesia Other Findings Past Medical History: No date: Diabetes mellitus without complication (HCC) No date: Hypercholesteremia No date: Hypertension No date: Sleep apnea Past Surgical History: No date: HERNIA REPAIR BMI    Body Mass Index: 38.01 kg/m     Reproductive/Obstetrics negative OB ROS                             Anesthesia Physical Anesthesia Plan  ASA: 3  Anesthesia Plan: General   Post-op Pain Management:    Induction:   PONV Risk Score and Plan:   Airway Management Planned:   Additional Equipment:   Intra-op Plan:   Post-operative Plan:   Informed Consent: I have reviewed the patients History and Physical, chart, labs and discussed the procedure including the risks, benefits and alternatives for the proposed anesthesia with the patient or authorized representative who has indicated his/her understanding and acceptance.     Dental Advisory Given  Plan Discussed with: CRNA  Anesthesia Plan Comments:        Anesthesia Quick Evaluation

## 2022-01-04 NOTE — Anesthesia Postprocedure Evaluation (Signed)
Anesthesia Post Note  Patient: [name]  Procedure(s) Performed: ESOPHAGOGASTRODUODENOSCOPY (EGD) COLONOSCOPY WITH PROPOFOL  Anesthesia Type: General Anesthetic complications: no   No notable events documented.   Last Vitals:  Vitals:   01/04/22 1035 01/04/22 1045  BP: (!) 92/53   Pulse:  73  Resp:    Temp:    SpO2:  100%    Last Pain:  Vitals:   01/04/22 1035  TempSrc:   PainSc: 0-No pain                 Molli Barrows

## 2022-01-04 NOTE — H&P (Signed)
[name], MD [address], [place], [place], [place], [address] [id number] [place], [place], [place], [address] Phone: [phone]  Fax: [phone]  Primary Care Physician:  [facility]   Pre-Procedure History & Physical: HPI:  Randy [name] is a 71 y.o. male is here for an endoscopy and colonoscopy    Past Medical History:  Diagnosis Date   Diabetes mellitus without complication ([place])    Hypercholesteremia    Hypertension    Sleep apnea     Past Surgical History:  Procedure Laterality Date   HERNIA REPAIR      Prior to Admission medications   Medication Sig Start Date End Date Taking? Authorizing Provider  ACCU-CHEK AVIVA PLUS test strip  09/28/20  Yes [provider]  albuterol (VENTOLIN HFA) 108 (90 Base) MCG/ACT inhaler Inhale into the lungs. 09/09/19  Yes [provider]  allopurinol (ZYLOPRIM) 100 MG tablet Take 1 tablet by mouth daily. 11/04/20  Yes [provider]  aspirin 81 MG tablet Take 81 mg by mouth daily.   Yes [provider]  atorvastatin (LIPITOR) 40 MG tablet Take 40 mg by mouth daily.   Yes [provider]  BD INSULIN SYRINGE U/F 31G X 5/16" 0.5 ML MISC 4 (four) times daily. 11/13/20  Yes [provider]  ferrous sulfate 325 (65 FE) MG tablet Take 325 mg by mouth every other day. 08/26/21  Yes [provider]  finasteride (PROSCAR) 5 MG tablet TAKE 1 TABLET BY MOUTH DAILY FOR ENLARGED PROSTATE ALONG WITH TAMSULOSIN 08/20/18  Yes [provider]  fluticasone (FLONASE) 50 MCG/ACT nasal spray Place 2 sprays into both nostrils daily. 09/05/19  Yes [provider]  insulin glargine (LANTUS) 100 UNIT/ML injection Inject 50 Units into the skin 2 (two) times daily.   Yes [provider]  insulin lispro (HUMALOG) 100 UNIT/ML injection Inject 30 Units into the skin 3 (three) times daily with meals.   Yes [provider]  JARDIANCE 10  MG TABS tablet Take 10 mg by mouth daily. 08/12/21  Yes [provider]  lisinopril (PRINIVIL,ZESTRIL) 10 MG tablet Take 10 mg by mouth daily.   Yes [provider]  ondansetron (ZOFRAN ODT) 4 MG disintegrating tablet Take 1 tablet (4 mg total) by mouth every 8 (eight) hours as needed for nausea or vomiting. 11/11/20  Yes Nena Polio, MD  tamsulosin (FLOMAX) 0.4 MG CAPS capsule TAKE 1 CAPSULE BY MOUTH EVERY DAY 30 MINUTES AFTER THE SAME MEAL 08/20/18  Yes [provider]  omeprazole (PRILOSEC) 20 MG capsule Take 1 capsule (20 mg total) by mouth 2 (two) times daily before a meal for 14 days. 11/17/21 12/01/21  [name], MD    Allergies as of 11/23/2021   (No Known Allergies)    Family History  Problem Relation Age of Onset   Prostate cancer Neg Hx    Bladder Cancer Neg Hx    Kidney cancer Neg Hx     Social History   Socioeconomic History   Marital status: Single    Spouse name: Not on file   Number of children: Not on file   Years of education: Not on file   Highest education level: Not on file  Occupational History   Not on file  Tobacco Use   Smoking status: Former    Types: Cigarettes   Smokeless tobacco: Never  Vaping Use   Vaping Use: Never used  Substance and Sexual  Activity   Alcohol use: Yes    Comment: infreqently   Drug use: No   Sexual activity: Yes  Other Topics Concern   Not on file  Social History Narrative   Not on file   Social Determinants of Health   Financial Resource Strain: Not on file  Food Insecurity: Not on file  Transportation Needs: Not on file  Physical Activity: Not on file  Stress: Not on file  Social Connections: Not on file  Intimate Partner Violence: Not on file    Review of Systems: See HPI, otherwise negative ROS  Physical Exam: BP (!) 144/107   Pulse 64   Temp (!) 96.5 F (35.8 C) (Temporal)   Resp 20   Ht '5\' 8"'$  (1.727 m)   Wt 113.4 kg   SpO2 100%   BMI 38.01 kg/m  General:   Alert,   pleasant and cooperative in NAD Head:  Normocephalic and atraumatic. Neck:  Supple; no masses or thyromegaly. Lungs:  Clear throughout to auscultation, normal respiratory effort.    Heart:  +S1, +S2, Regular rate and rhythm, No edema. Abdomen:  Soft, nontender and nondistended. Normal bowel sounds, without guarding, and without rebound.   Neurologic:  Alert and  oriented x4;  grossly normal neurologically.  Impression/Plan: Randy [name] is here for an endoscopy and colonoscopy  to be performed for  evaluation of iron deficiency anemia    Risks, benefits, limitations, and alternatives regarding endoscopy have been reviewed with the patient.  Questions have been answered.  All parties agreeable.   [name], MD  01/04/2022, 9:18 AM

## 2022-01-05 ENCOUNTER — Encounter: Payer: Self-pay | Admitting: Gastroenterology

## 2022-01-05 LAB — SURGICAL PATHOLOGY

## 2022-01-24 ENCOUNTER — Encounter: Payer: Self-pay | Admitting: Emergency Medicine

## 2022-01-24 ENCOUNTER — Other Ambulatory Visit: Payer: Self-pay

## 2022-01-24 ENCOUNTER — Emergency Department
Admission: EM | Admit: 2022-01-24 | Discharge: 2022-01-24 | Disposition: A | Discharge status: Home / Self Care | Payer: Medicare Other | Attending: Emergency Medicine | Admitting: Emergency Medicine

## 2022-01-24 DIAGNOSIS — E119 Type 2 diabetes mellitus without complications: Secondary | ICD-10-CM | POA: Insufficient documentation

## 2022-01-24 DIAGNOSIS — M79605 Pain in left leg: Secondary | ICD-10-CM | POA: Insufficient documentation

## 2022-01-24 MED ORDER — NAPROXEN 500 MG PO TABS: 500.0000 mg | ORAL_TABLET | Freq: Two times a day (BID) | ORAL | 2 refills | Status: DC

## 2022-01-24 MED ORDER — CYCLOBENZAPRINE HCL 10 MG PO TABS: 10.0000 mg | ORAL_TABLET | Freq: Three times a day (TID) | ORAL | 0 refills | Status: DC | PRN

## 2022-01-24 NOTE — ED Notes (Signed)
See triage note  Presents with pain to left foot /leg  States pain started 3 weeks ago w/o injury  Min swelling

## 2022-01-24 NOTE — ED Triage Notes (Signed)
Pt presents via EMS with complaints of left leg pain that started a week ago. He notes the pain starting in his left ankle and radiates towards his left hip. Hx of sciatic nerve pain. Full ROM of the left leg. Denies falls or injury.   VS with EMS 155/96 76 97% RA

## 2022-01-24 NOTE — ED Provider Notes (Signed)
   [facility] Provider Note    Event Date/Time   First MD Initiated Contact with Patient 01/24/22 [id number]     (approximate)   History   Leg Pain   HPI  Randy [name] is a 71 y.o. male with history of diabetes who presents with complaints of left anterior lower leg pain for 3 weeks.  Patient reports discomfort anterior to the shin.  Denies injury to the area.  No redness.  Worse with plantarflexion.     Physical Exam   Triage Vital Signs: ED Triage Vitals  Enc Vitals Group     BP 01/24/22 0613 (!) 146/74     Pulse Rate 01/24/22 0613 70     Resp 01/24/22 0613 20     Temp 01/24/22 0613 97.7 F (36.5 C)     Temp Source 01/24/22 0613 Oral     SpO2 01/24/22 0613 95 %     Weight 01/24/22 0611 115 kg (253 lb 8.5 oz)     Height 01/24/22 0611 1.727 m ('5\' 8"'$ )     Head Circumference --      Peak Flow --      Pain Score 01/24/22 0608 10     Pain Loc --      Pain Edu? --      Excl. in [place]? --     Most recent vital signs: Vitals:   01/24/22 0613  BP: (!) 146/74  Pulse: 70  Resp: 20  Temp: 97.7 F (36.5 C)  SpO2: 95%     General: Awake, no distress.  CV:  Good peripheral perfusion.  Resp:  Normal effort.  Abd:  No distention.  Other:  Left leg: Warm and well-perfused, 2+ distal pulses.  No calf tenderness or swelling.  Pain increases with forced plantarflexion   ED Results / Procedures / Treatments   Labs (all labs ordered are listed, but only abnormal results are displayed) Labs Reviewed - No data to display   EKG     RADIOLOGY     PROCEDURES:  Critical Care performed:   Procedures   MEDICATIONS ORDERED IN ED: Medications - No data to display   IMPRESSION / MDM / Felt / ED COURSE  I reviewed the triage vital signs and the nursing notes. Patient's presentation is most consistent with acute, uncomplicated illness.  Patient presents with left lower leg pain as above.  Differential includes musculoskeletal  pain, DVT  Exam is most consistent with musculoskeletal pain, pain is entirely anterior to the left tibia, no posterior calf or leg pain.  Extremities warm and well-perfused with normal pulses.  Supportive care, outpatient follow-up with Ortho if no improvement.        FINAL CLINICAL IMPRESSION(S) / ED DIAGNOSES   Final diagnoses:  Pain of left lower extremity     Rx / DC Orders   ED Discharge Orders          Ordered    naproxen (NAPROSYN) 500 MG tablet  2 times daily with meals        01/24/22 0715    cyclobenzaprine (FLEXERIL) 10 MG tablet  3 times daily PRN        01/24/22 0715             Note:  This document was prepared using Dragon voice recognition software and may include unintentional dictation errors.   Lavonia Drafts, MD 01/24/22 [phone]

## 2022-02-09 ENCOUNTER — Inpatient Hospital Stay: Payer: Medicare Other | Attending: Internal Medicine

## 2022-02-09 DIAGNOSIS — D509 Iron deficiency anemia, unspecified: Secondary | ICD-10-CM | POA: Insufficient documentation

## 2022-02-09 DIAGNOSIS — M25572 Pain in left ankle and joints of left foot: Secondary | ICD-10-CM | POA: Diagnosis not present

## 2022-02-09 DIAGNOSIS — Z79899 Other long term (current) drug therapy: Secondary | ICD-10-CM | POA: Insufficient documentation

## 2022-02-09 DIAGNOSIS — Z87891 Personal history of nicotine dependence: Secondary | ICD-10-CM | POA: Insufficient documentation

## 2022-02-09 DIAGNOSIS — E538 Deficiency of other specified B group vitamins: Secondary | ICD-10-CM | POA: Insufficient documentation

## 2022-02-09 LAB — CBC WITH DIFFERENTIAL/PLATELET
Abs Immature Granulocytes: 0.01 10*3/uL (ref 0.00–0.07)
Basophils Absolute: 0 10*3/uL (ref 0.0–0.1)
Basophils Relative: 1 %
Eosinophils Absolute: 0.3 10*3/uL (ref 0.0–0.5)
Eosinophils Relative: 5 %
HCT: 40.9 % (ref 39.0–52.0)
Hemoglobin: 13.6 g/dL (ref 13.0–17.0)
Immature Granulocytes: 0 %
Lymphocytes Relative: 35 %
Lymphs Abs: 2.1 10*3/uL (ref 0.7–4.0)
MCH: 27.1 pg (ref 26.0–34.0)
MCHC: 33.3 g/dL (ref 30.0–36.0)
MCV: 81.5 fL (ref 80.0–100.0)
Monocytes Absolute: 0.8 10*3/uL (ref 0.1–1.0)
Monocytes Relative: 13 %
Neutro Abs: 2.8 10*3/uL (ref 1.7–7.7)
Neutrophils Relative %: 46 %
Platelets: 295 10*3/uL (ref 150–400)
RBC: 5.02 MIL/uL (ref 4.22–5.81)
RDW: 16.6 % — ABNORMAL HIGH (ref 11.5–15.5)
WBC: 6 10*3/uL (ref 4.0–10.5)
nRBC: 0 % (ref 0.0–0.2)

## 2022-02-09 LAB — VITAMIN B12: Vitamin B-12: 676 pg/mL (ref 180–914)

## 2022-02-09 LAB — IRON AND TIBC
Iron: 57 ug/dL (ref 45–182)
Saturation Ratios: 17 % — ABNORMAL LOW (ref 17.9–39.5)
TIBC: 330 ug/dL (ref 250–450)
UIBC: 273 ug/dL

## 2022-02-09 LAB — FERRITIN: Ferritin: 35 ng/mL (ref 24–336)

## 2022-02-11 ENCOUNTER — Encounter: Payer: Self-pay | Admitting: Internal Medicine

## 2022-02-11 ENCOUNTER — Inpatient Hospital Stay (HOSPITAL_BASED_OUTPATIENT_CLINIC_OR_DEPARTMENT_OTHER): Payer: Medicare Other | Admitting: Internal Medicine

## 2022-02-11 VITALS — BP 163/78 | HR 93 | Temp 96.6°F | Resp 20 | Wt 251.0 lb

## 2022-02-11 DIAGNOSIS — D509 Iron deficiency anemia, unspecified: Secondary | ICD-10-CM | POA: Diagnosis not present

## 2022-02-11 DIAGNOSIS — E538 Deficiency of other specified B group vitamins: Secondary | ICD-10-CM

## 2022-02-11 DIAGNOSIS — M19072 Primary osteoarthritis, left ankle and foot: Secondary | ICD-10-CM | POA: Diagnosis not present

## 2022-02-11 NOTE — Progress Notes (Signed)
Randy [name]  [phone] [phone]  ID: [id number] ISHMAN OB: 07-14-1950  MR#: [id number]  EYC#:[id number]  Patient Care Team: [facility] [place] as PCP - General (General Practice)  REFERRING PROVIDER: Dr. [name]  REASON FOR REFERRAL: IDA  HPI: ETHER WOLTERS is a 71 y.o. male with past medical history of diabetes, hypertension, hyperlipidemia, iron deficiency anemia and H. pylori positive was referred to hematology clinic for further management of iron deficiency anemia.  He has been taking oral iron since June 2023.  Iron panel in September 2023 did not show any improvement.  CBC with differential was reviewed.  Patient has anemia since January 2022.  Iron studies not available until recently on 11/09/2021 which showed ferritin of 12 and percent saturation of 8.  Folic acid is normal.  Vitamin B12 is low at 216.  He was started on oral vitamin B12 1000 mcg 2 weeks ago. H. pylori breath test was also positive and started on triple drug therapy.  Patient has been taking meloxicam once a day for long time.  He did not know the indication for the drug.  He stopped taking it now.  Colonoscopy and endoscopy was performed by Dr. Vicente Males on 01/14/2022.  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 Two polyps removed from ascending colon showing tubular adenomas.   INTERVAL HISTORY-  Patient was seen today to follow-up labs. He complains of severe pain in his left ankle that started 2 to 3 weeks ago.  He saw [facility].  He was advised to use brace, Tylenol and Voltaren gel.  Reports brace is worsening his pain.  Tylenol is making him nauseous.  He is using 1 g gel with no improvement.  Denies any bleeding or black tarry stools.   REVIEW OF SYSTEMS:   Review of Systems  All other systems reviewed and are negative.   As per HPI.  Otherwise, a complete review of systems is negative.  PAST MEDICAL HISTORY: Past Medical History:  Diagnosis Date   Diabetes mellitus without complication ([facility])    Hypercholesteremia    Hypertension    Sleep apnea     PAST SURGICAL HISTORY: Past Surgical History:  Procedure Laterality Date   COLONOSCOPY WITH PROPOFOL N/A 01/04/2022   Procedure: COLONOSCOPY WITH PROPOFOL;  Surgeon: [name], MD;  Location[facility] ENDOSCOPY;  Service: Gastroenterology;  Laterality: N/A;   ESOPHAGOGASTRODUODENOSCOPY N/A 01/04/2022   Procedure: ESOPHAGOGASTRODUODENOSCOPY (EGD);  Surgeon: [name], MD;  Location: [facility] ENDOSCOPY;  Service: Gastroenterology;  Laterality: N/A;   HERNIA REPAIR      FAMILY HISTORY: Family History  Problem Relation Age of Onset   Prostate cancer Neg Hx    Bladder Cancer Neg Hx    Kidney cancer Neg Hx     HEALTH MAINTENANCE: Social History   Tobacco Use   Smoking status: Former    Types: Cigarettes   Smokeless tobacco: Never  Vaping Use   Vaping Use: Never used  Substance Use Topics   Alcohol use: Yes    Comment: infreqently   Drug use: No     No Known Allergies  Current Outpatient Medications  Medication Sig Dispense Refill   ACCU-CHEK AVIVA PLUS test strip      albuterol (VENTOLIN HFA) 108 (90 Base) MCG/ACT inhaler Inhale into the lungs.     allopurinol (ZYLOPRIM) 100 MG tablet Take 1 tablet by mouth daily.  aspirin 81 MG tablet Take 81 mg by mouth daily.     atorvastatin (LIPITOR) 40 MG tablet Take 40 mg by mouth daily.     BD INSULIN SYRINGE U/F 31G X 5/16" 0.5 ML MISC 4 (four) times daily.     cyclobenzaprine (FLEXERIL) 10 MG tablet Take 1 tablet (10 mg total) by mouth 3 (three) times daily as needed for muscle spasms. 15 tablet 0   ferrous sulfate 325 (65 FE) MG tablet Take 325 mg by mouth every other day.     finasteride (PROSCAR) 5 MG tablet TAKE 1 TABLET BY MOUTH DAILY FOR ENLARGED PROSTATE ALONG WITH TAMSULOSIN     fluticasone (FLONASE) 50  MCG/ACT nasal spray Place 2 sprays into both nostrils daily.     insulin glargine (LANTUS) 100 UNIT/ML injection Inject 50 Units into the skin 2 (two) times daily.     insulin lispro (HUMALOG) 100 UNIT/ML injection Inject 30 Units into the skin 3 (three) times daily with meals.     JARDIANCE 10 MG TABS tablet Take 10 mg by mouth daily.     lisinopril (PRINIVIL,ZESTRIL) 10 MG tablet Take 10 mg by mouth daily.     naproxen (NAPROSYN) 500 MG tablet Take 1 tablet (500 mg total) by mouth 2 (two) times daily with a meal. 20 tablet 2   omeprazole (PRILOSEC) 20 MG capsule Take 1 capsule (20 mg total) by mouth 2 (two) times daily before a meal. 28 capsule 0   ondansetron (ZOFRAN ODT) 4 MG disintegrating tablet Take 1 tablet (4 mg total) by mouth every 8 (eight) hours as needed for nausea or vomiting. 20 tablet 0   tamsulosin (FLOMAX) 0.4 MG CAPS capsule TAKE 1 CAPSULE BY MOUTH EVERY DAY 30 MINUTES AFTER THE SAME MEAL     No current facility-administered medications for this visit.    OBJECTIVE: Vitals:   02/11/22 0950  BP: (!) 163/78  Pulse: 93  Resp: 20  Temp: (!) 96.6 F (35.9 C)  SpO2: 99%     Body mass index is 38.16 kg/m.      General: Well-developed, well-nourished, no acute distress. Eyes: Pink conjunctiva, anicteric sclera. HEENT: Normocephalic, moist mucous membranes, clear oropharnyx. Lungs: Clear to auscultation bilaterally. Heart: Regular rate and rhythm. No rubs, murmurs, or gallops. Abdomen: Soft, nontender, nondistended. No organomegaly noted, normoactive bowel sounds. Musculoskeletal: No edema, cyanosis, or clubbing. Neuro: Alert, answering all questions appropriately. Cranial nerves grossly intact. Skin: No rashes or petechiae noted. Psych: Normal affect. Lymphatics: No cervical, calvicular, axillary or inguinal LAD.   LAB RESULTS:  Lab Results  Component Value Date   NA 135 05/19/2021   K 3.0 (L) 05/19/2021   CL 100 05/19/2021   CO2 26 05/19/2021   GLUCOSE 131  (H) 05/19/2021   BUN 10 05/19/2021   CREATININE 0.85 05/19/2021   CALCIUM 8.5 (L) 05/19/2021   PROT 7.4 05/19/2021   ALBUMIN 3.4 (L) 05/19/2021   AST 18 05/19/2021   ALT 12 05/19/2021   ALKPHOS 53 05/19/2021   BILITOT 0.5 05/19/2021   GFRNONAA >60 05/19/2021   GFRAA >60 03/09/2018    Lab Results  Component Value Date   WBC 6.0 02/09/2022   NEUTROABS 2.8 02/09/2022   HGB 13.6 02/09/2022   HCT 40.9 02/09/2022   MCV 81.5 02/09/2022   PLT 295 02/09/2022    Lab Results  Component Value Date   TIBC 330 02/09/2022   TIBC 326 11/09/2021   FERRITIN 35 02/09/2022   FERRITIN 12 (L) 11/09/2021  IRONPCTSAT 17 (L) 02/09/2022   IRONPCTSAT 8 (LL) 11/09/2021     STUDIES: No results found.  ASSESSMENT AND PLAN:   ITALO BANTON is a 71 y.o. male with pmh of diabetes, hypertension, hyperlipidemia, iron deficiency anemia and H. pylori positive was referred to hematology clinic for further management of iron deficiency anemia.  # Iron deficiency anemia  - Colonoscopy and endoscopy was performed by Dr. Vicente Males on 01/14/2022.  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 Two polyps removed from ascending colon showing tubular adenomas.  - s/p 2 doses of IV Feraheme.  Responded very well.  Repeat hemoglobin and iron studies normal.  He will continue with oral iron.  # B12 deficiency -B12 level from 11/09/2021 216 -Celiac panel was negative -Continue with B12 1000 mcg daily.  B12 improved to around 600.  # H pylori positive -Triple drug therapy on 11/17/2021 by GI.  # Left ankle pain -Follows with [facility].  No improvement with brace and Voltaren gel.  Cannot tolerate Tylenol.  Cannot take NSAIDs due to GI bleed. -Placed a call to the [facility] and requested the provider to follow-up with the patient and assess the benefit for steroid injection.  Orders Placed This Encounter   Procedures   CBC with Differential/Platelet   Iron and TIBC   Ferritin    RTC in 4 months for MD visit, labs.  Patient expressed understanding and was in agreement with this plan. He also understands that He can call clinic at any time with any questions, concerns, or complaints.   I spent a total of 30 minutes reviewing chart data, face-to-face evaluation with the patient, counseling and coordination of care as detailed above.  Jane Canary, MD   02/11/2022 10:23 AM

## 2022-02-15 ENCOUNTER — Emergency Department: Payer: Medicare Other

## 2022-02-15 ENCOUNTER — Other Ambulatory Visit: Payer: Self-pay

## 2022-02-15 ENCOUNTER — Emergency Department
Admission: EM | Admit: 2022-02-15 | Discharge: 2022-02-15 | Disposition: A | Discharge status: Home / Self Care | Payer: Medicare Other | Attending: Emergency Medicine | Admitting: Emergency Medicine

## 2022-02-15 ENCOUNTER — Encounter: Payer: Self-pay | Admitting: Emergency Medicine

## 2022-02-15 DIAGNOSIS — R600 Localized edema: Secondary | ICD-10-CM

## 2022-02-15 DIAGNOSIS — M79605 Pain in left leg: Secondary | ICD-10-CM | POA: Diagnosis present

## 2022-02-15 DIAGNOSIS — M79672 Pain in left foot: Secondary | ICD-10-CM | POA: Diagnosis not present

## 2022-02-15 MED ORDER — NAPROXEN 500 MG PO TABS: 500.0000 mg | ORAL_TABLET | Freq: Two times a day (BID) | ORAL | 2 refills | Status: DC

## 2022-02-15 NOTE — ED Provider Triage Note (Signed)
  Emergency Medicine Provider Triage Evaluation Note  Randy [name] , a 71 y.o.male,  was evaluated in triage.  Pt complains of left lower leg pain.  Patient states that this has been ongoing for 3 weeks.  Denies any recent falls or injuries.   Review of Systems  Positive: Left leg pain. Negative: Denies fever, chest pain, vomiting  Physical Exam   Vitals:   02/15/22 0918  BP: 127/67  Pulse: (!) 104  Resp: 14  Temp: 98.8 F (37.1 C)  SpO2: 97%   Gen:   Awake, no distress   Resp:  Normal effort  MSK:   Moves extremities without difficulty  Other:  Mild swelling in the foot/ankle region.  No significant erythema.  Tenderness along the posterior aspect of the Achilles region.  Medical Decision Making  Given the patient's initial medical screening exam, the following diagnostic evaluation has been ordered. The patient will be placed in the appropriate treatment space, once one is available, to complete the evaluation and treatment. I have discussed the plan of care with the patient and I have advised the patient that an ED physician or mid-level practitioner will reevaluate their condition after the test results have been received, as the results may give them additional insight into the type of treatment they may need.    Diagnostics: DVT ultrasound.  Treatments: none immediately   Teodoro Spray, [place] 02/15/22 [phone]

## 2022-02-15 NOTE — ED Triage Notes (Signed)
Left leg pain from foot to knee for 3 weeks.  Says no injury.  Not red.  Foot and ankle swollen.

## 2022-02-15 NOTE — ED Provider Notes (Signed)
   [facility] Provider Note    Event Date/Time   First MD Initiated Contact with Patient 02/15/22 1046     (approximate)   History   Leg Pain   HPI  Randy [name] is a 72 y.o. male who presents with complaints of left lower extremity pain.  Patient describes pain in his left foot primarily and a little bit of pain in his calf which has been ongoing for about 3 weeks.  He did not drop anything on it, no trauma reported.  No swelling or redness.  Has not take anything for it.     Physical Exam   Triage Vital Signs: ED Triage Vitals  Enc Vitals Group     BP 02/15/22 0918 127/67     Pulse Rate 02/15/22 0918 (!) 104     Resp 02/15/22 0918 14     Temp 02/15/22 0918 98.8 F (37.1 C)     Temp Source 02/15/22 0918 Oral     SpO2 02/15/22 0918 97 %     Weight 02/15/22 0919 113.9 kg (250 lb 15.9 oz)     Height 02/15/22 0919 1.727 m ('5\' 8"'$ )     Head Circumference --      Peak Flow --      Pain Score 02/15/22 0918 10     Pain Loc --      Pain Edu? --      Excl. in [place]? --     Most recent vital signs: Vitals:   02/15/22 0918  BP: 127/67  Pulse: (!) 104  Resp: 14  Temp: 98.8 F (37.1 C)  SpO2: 97%     General: Awake, no distress.  CV:  Good peripheral perfusion.  Resp:  Normal effort.  Abd:  No distention.  Other:  Left foot: Mild tenderness to the top of the midfoot, abrasion there but no evidence of cellulitis or bony abnormality, warm and well-perfused   ED Results / Procedures / Treatments   Labs (all labs ordered are listed, but only abnormal results are displayed) Labs Reviewed - No data to display   EKG     RADIOLOGY Ultrasound negative for DVT    PROCEDURES:  Critical Care performed:   Procedures   MEDICATIONS ORDERED IN ED: Medications - No data to display   IMPRESSION / MDM / [place] / ED COURSE  I reviewed the triage vital signs and the nursing notes. Patient's presentation is most consistent  with acute complicated illness / injury requiring diagnostic workup.   Patient presents with left foot pain as above.  No evidence of infection, no traumatic injury.  Doubt fracture, ultrasound negative for DVT, offered x-ray however patient declined, request analgesic and discharge       FINAL CLINICAL IMPRESSION(S) / ED DIAGNOSES   Final diagnoses:  Left foot pain     Rx / DC Orders   ED Discharge Orders          Ordered    naproxen (NAPROSYN) 500 MG tablet  2 times daily with meals        02/15/22 1139             Note:  This document was prepared using Dragon voice recognition software and may include unintentional dictation errors.   Lavonia Drafts, MD 02/15/22 [phone]

## 2022-03-07 ENCOUNTER — Encounter: Payer: Self-pay | Admitting: Internal Medicine

## 2022-03-09 ENCOUNTER — Other Ambulatory Visit (INDEPENDENT_AMBULATORY_CARE_PROVIDER_SITE_OTHER): Payer: Self-pay | Admitting: Vascular Surgery

## 2022-03-09 DIAGNOSIS — I70219 Atherosclerosis of native arteries of extremities with intermittent claudication, unspecified extremity: Secondary | ICD-10-CM | POA: Insufficient documentation

## 2022-03-09 DIAGNOSIS — E785 Hyperlipidemia, unspecified: Secondary | ICD-10-CM | POA: Insufficient documentation

## 2022-03-09 DIAGNOSIS — E119 Type 2 diabetes mellitus without complications: Secondary | ICD-10-CM | POA: Insufficient documentation

## 2022-03-09 DIAGNOSIS — I10 Essential (primary) hypertension: Secondary | ICD-10-CM | POA: Insufficient documentation

## 2022-03-09 DIAGNOSIS — E11621 Type 2 diabetes mellitus with foot ulcer: Secondary | ICD-10-CM

## 2022-03-09 DIAGNOSIS — M79605 Pain in left leg: Secondary | ICD-10-CM

## 2022-03-09 NOTE — Progress Notes (Signed)
MRN : [id number]  R[name] is a 72 y.o. (20-Sep-1950) male who presents with chief complaint of check circulation.  History of Present Illness:    The patient is seen for evaluation of painful lower extremities. Patient notes the pain is variable and not always associated with activity.  The pain is somewhat consistent day to day occurring on most days. The patient notes the pain also occurs with standing and routinely seems worse as the day wears on. The pain has been progressive over the past several weeks.  The area that is most painful is the anterior shin portion down to his foot.  The pain is worse with dorsiflexion.  The patient states these symptoms are causing  a negative impact on quality of life and daily activities which was a factor in the referral.  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   The patient denies rest pain or dangling of an extremity off the side of the bed during the night for relief. No open wounds or sores at this time. No history of DVT or phlebitis. No prior vascular interventions or surgeries.  The patient previously underwent a DVT study on 02/15/2022 which was negative for any DVT in the left lower extremity.  Today the patient underwent ABIs which show a right ABI of 1.25 and a left 1.15.  These are both in the range of normal.  The patient has a TBI of 1.05 on the right and 0.86 on the left.  These are also considered to be normal TBI's.  He has strong triphasic tibial artery waveforms bilaterally with good toe waveforms bilaterally.  These are indicative of normal studies.  Current Meds  Medication Sig   ACCU-CHEK AVIVA PLUS test strip    albuterol (VENTOLIN HFA) 108 (90 Base) MCG/ACT inhaler Inhale into the lungs.   allopurinol (ZYLOPRIM) 100 MG tablet Take 1 tablet by mouth daily.   aspirin 81 MG tablet Take 81 mg by mouth daily.   atorvastatin (LIPITOR) 40 MG tablet Take 40 mg by mouth daily.    BD INSULIN SYRINGE U/F 31G X 5/16" 0.5 ML MISC 4 (four) times daily.   cyanocobalamin (VITAMIN B12) 1000 MCG tablet Take 1,000 mcg by mouth daily.   insulin glargine (LANTUS) 100 UNIT/ML injection Inject 50 Units into the skin 2 (two) times daily.   insulin lispro (HUMALOG) 100 UNIT/ML injection Inject 30 Units into the skin 3 (three) times daily with meals.   lisinopril (PRINIVIL,ZESTRIL) 10 MG tablet Take 10 mg by mouth daily.   tamsulosin (FLOMAX) 0.4 MG CAPS capsule TAKE 1 CAPSULE BY MOUTH EVERY DAY 30 MINUTES AFTER THE SAME MEAL   traMADol (ULTRAM) 50 MG tablet Take 1 tablet (50 mg total) by mouth every 6 (six) hours as needed.   [DISCONTINUED] pregabalin (LYRICA) 50 MG capsule Take 50 mg by mouth 2 (two) times daily.    Past Medical History:  Diagnosis Date   Diabetes mellitus without complication ([place])    Hypercholesteremia    Hypertension    Sleep apnea     Past Surgical History:  Procedure Laterality Date   COLONOSCOPY WITH PROPOFOL N/A 01/04/2022   Procedure: COLONOSCOPY WITH PROPOFOL;  Surgeon: [name], MD;  Location: [facility] ENDOSCOPY;  Service: Gastroenterology;  Laterality: N/A;   ESOPHAGOGASTRODUODENOSCOPY N/A 01/04/2022   Procedure: ESOPHAGOGASTRODUODENOSCOPY (EGD);  Surgeon: [name], MD;  Location: [facility] ENDOSCOPY;  Service: Gastroenterology;  Laterality: N/A;   HERNIA  REPAIR      Social History Social History   Tobacco Use   Smoking status: Former    Packs/day: 1.00    Years: 25.00    Total pack years: 25.00    Types: Cigarettes    Start date: 03/01/1967    Quit date: 02/29/1992    Years since quitting: 30.0    Passive exposure: Past   Smokeless tobacco: Never  Vaping Use   Vaping Use: Never used  Substance Use Topics   Alcohol use: Yes    Comment: infreqently   Drug use: No    Family History Family History  Problem Relation Age of Onset   Prostate cancer Neg Hx    Bladder Cancer Neg Hx    Kidney cancer Neg Hx     No Known  Allergies   REVIEW OF SYSTEMS (Negative unless checked)  Constitutional: '[]'$ Weight loss  '[]'$ Fever  '[]'$ Chills Cardiac: '[]'$ Chest pain   '[]'$ Chest pressure   '[]'$ Palpitations   '[]'$ Shortness of breath when laying flat   '[]'$ Shortness of breath with exertion. Vascular:  '[]'$ Pain in legs with walking   '[x]'$ Pain in legs at rest  '[]'$ History of DVT   '[]'$ Phlebitis   '[x]'$ Swelling in legs   '[]'$ Varicose veins   '[]'$ Non-healing ulcers Pulmonary:   '[]'$ Uses home oxygen   '[]'$ Productive cough   '[]'$ Hemoptysis   '[]'$ Wheeze  '[x]'$ COPD   '[]'$ Asthma Neurologic:  '[]'$ Dizziness   '[]'$ Seizures   '[]'$ History of stroke   '[]'$ History of TIA  '[]'$ Aphasia   '[]'$ Vissual changes   '[]'$ Weakness or numbness in arm   '[]'$ Weakness or numbness in leg Musculoskeletal:   '[]'$ Joint swelling   '[x]'$ Joint pain   '[]'$ Low back pain Hematologic:  '[]'$ Easy bruising  '[]'$ Easy bleeding   '[]'$ Hypercoagulable state   '[]'$ Anemic Gastrointestinal:  '[]'$ Diarrhea   '[]'$ Vomiting  '[x]'$ Gastroesophageal reflux/heartburn   '[]'$ Difficulty swallowing. Genitourinary:  '[]'$ Chronic kidney disease   '[]'$ Difficult urination  '[]'$ Frequent urination   '[]'$ Blood in urine Skin:  '[]'$ Rashes   '[]'$ Ulcers  Psychological:  '[]'$ History of anxiety   '[]'$  History of major depression.  Physical Examination  Vitals:   03/10/22 1411  BP: (!) 155/84  Pulse: 83  Weight: 250 lb (113.4 kg)  Height: '5\' 8"'$  (1.727 m)   Body mass index is 38.01 kg/m. Gen: WD/WN, NAD Head: [place]/AT, No temporalis wasting.  Ear/Nose/Throat: Hearing grossly intact, nares w/o erythema or drainage Eyes: PER, EOMI, sclera nonicteric.  Neck: Supple, no masses.  No bruit or JVD.  Pulmonary:  Good air movement, no audible wheezing, no use of accessory muscles.  Cardiac: RRR, normal S1, S2, no Murmurs. Vascular:  mild trophic changes, no open wounds Vessel Right Left  Radial Palpable Palpable  PT Not Palpable Not Palpable  DP Palpable Palpable  Gastrointestinal: soft, non-distended. No guarding/no peritoneal signs.  Musculoskeletal: M/S 5/5 throughout.  No visible  deformity.  Neurologic: CN 2-12 intact. Pain and light touch intact in extremities.  Symmetrical.  Speech is fluent. Motor exam as listed above. Psychiatric: Judgment intact, Mood & affect appropriate for pt's clinical situation. Dermatologic: No rashes or ulcers noted.  No changes consistent with cellulitis.   CBC Lab Results  Component Value Date   WBC 6.0 02/09/2022   HGB 13.6 02/09/2022   HCT 40.9 02/09/2022   MCV 81.5 02/09/2022   PLT 295 02/09/2022    BMET    Component Value Date/Time   NA 135 05/19/2021 2005   NA 132 (L) 01/29/2013 1038   K 3.0 (L) 05/19/2021 2005   K 4.3 01/29/2013 1038  CL 100 05/19/2021 2005   CL 102 01/29/2013 1038   CO2 26 05/19/2021 2005   CO2 25 01/29/2013 1038   GLUCOSE 131 (H) 05/19/2021 2005   GLUCOSE 188 (H) 01/29/2013 1038   BUN 10 05/19/2021 2005   BUN 20 (H) 01/29/2013 1038   CREATININE 0.85 05/19/2021 2005   CREATININE 1.30 01/29/2013 1038   CALCIUM 8.5 (L) 05/19/2021 2005   CALCIUM 8.7 01/29/2013 1038   GFRNONAA >60 05/19/2021 2005   GFRNONAA 59 (L) 01/29/2013 1038   GFRAA >60 03/09/2018 0835   GFRAA >60 01/29/2013 1038   CrCl cannot be calculated (Patient's most recent lab result is older than the maximum 21 days allowed.).  COAG No results found for: "INR", "PROTIME"  Radiology VAS [place] ABI WITH/WO TBI  Result Date: 03/14/2022  LOWER EXTREMITY DOPPLER STUDY Patient Name:  Randy [name]  Date of Exam:   03/10/2022 Medical Rec #: [id number]         Accession #:    [id number] Date of Birth: [birth year]         Patient Gender: M Patient Age:   69 years Exam Location:  [facility] Procedure:      VAS [place] ABI WITH/WO TBI Referring Phys: GREGORY SCHNIER --------------------------------------------------------------------------------  High Risk         Hypertension, hyperlipidemia, Diabetes, past history of Factors:          smoking.  Performing Technologist: Delorise Shiner RVT  Examination Guidelines: A complete evaluation  includes at minimum, Doppler waveform signals and systolic blood pressure reading at the level of bilateral brachial, anterior tibial, and posterior tibial arteries, when vessel segments are accessible. Bilateral testing is considered an integral part of a complete examination. Photoelectric Plethysmograph (PPG) waveforms and toe systolic pressure readings are included as required and additional duplex testing as needed. Limited examinations for reoccurring indications may be performed as noted.  ABI Findings: +---------+------------------+-----+---------+--------+ Right    Rt Pressure (mmHg)IndexWaveform Comment  +---------+------------------+-----+---------+--------+ Brachial 156                                      +---------+------------------+-----+---------+--------+ PTA      197               1.25 triphasic         +---------+------------------+-----+---------+--------+ DP       189               1.20 triphasic         +---------+------------------+-----+---------+--------+ Great Toe165               1.05                   +---------+------------------+-----+---------+--------+ +---------+------------------+-----+---------+-------+ Left     Lt Pressure (mmHg)IndexWaveform Comment +---------+------------------+-----+---------+-------+ Brachial 157                                     +---------+------------------+-----+---------+-------+ PTA      181               1.15 triphasic        +---------+------------------+-----+---------+-------+ DP       169               1.08 triphasic        +---------+------------------+-----+---------+-------+ Georgeanna Harrison  0.88                  +---------+------------------+-----+---------+-------+ +-------+-----------+-----------+------------+------------+ ABI/TBIToday's ABIToday's TBIPrevious ABIPrevious TBI +-------+-----------+-----------+------------+------------+ Right  1.25       1.05                                 +-------+-----------+-----------+------------+------------+ Left   1.15       0.86                                +-------+-----------+-----------+------------+------------+  Summary: Right: Resting right ankle-brachial index is within normal range. The right toe-brachial index is normal. Left: Resting left ankle-brachial index is within normal range. The left toe-brachial index is normal. *See table(s) above for measurements and observations.  Electronically signed by Hortencia Pilar MD on 03/14/2022 at 4:48:59 PM.    Final      Assessment/Plan 1. Left leg pain Recommend:  The patient has atypical pain symptoms for vascular disease and on exam I do not find evidence of vascular pathology that would explain the patient's symptoms.  Noninvasive studies do not identify significant vascular problems  I suspect the patient is c/o pseudoclaudication.  Patient should have an evaluation of the LS spine which I defer to the primary service or the Spine service.  The patient should continue walking and begin a more formal exercise program. The patient should continue his antiplatelet therapy and aggressive treatment of the lipid abnormalities.  Patient will follow-up with me on a PRN basis.  We have also given the patient a one-time prescription for tramadol to help with his pain symptoms.  Otherwise he is instructed to follow-up with his PCP for further workup and evaluation. - VAS [place] ABI WITH/WO TBI  2. Essential hypertension Continue antihypertensive medications as already ordered, these medications have been reviewed and there are no changes at this time.  3. Type 2 diabetes mellitus with hyperglycemia, with long-term current use of insulin (HCC) Continue hypoglycemic medications as already ordered, these medications have been reviewed and there are no changes at this time.  Hgb A1C to be monitored as already arranged by primary service    Kris Hartmann,  NP  03/18/2022 10:27 AM

## 2022-03-10 ENCOUNTER — Ambulatory Visit (INDEPENDENT_AMBULATORY_CARE_PROVIDER_SITE_OTHER): Payer: 59

## 2022-03-10 ENCOUNTER — Encounter (INDEPENDENT_AMBULATORY_CARE_PROVIDER_SITE_OTHER): Payer: Self-pay | Admitting: Nurse Practitioner

## 2022-03-10 ENCOUNTER — Ambulatory Visit (INDEPENDENT_AMBULATORY_CARE_PROVIDER_SITE_OTHER): Payer: 59 | Admitting: Nurse Practitioner

## 2022-03-10 VITALS — BP 155/84 | HR 83 | Ht 68.0 in | Wt 250.0 lb

## 2022-03-10 DIAGNOSIS — I10 Essential (primary) hypertension: Secondary | ICD-10-CM | POA: Diagnosis not present

## 2022-03-10 DIAGNOSIS — E782 Mixed hyperlipidemia: Secondary | ICD-10-CM

## 2022-03-10 DIAGNOSIS — E1165 Type 2 diabetes mellitus with hyperglycemia: Secondary | ICD-10-CM | POA: Diagnosis not present

## 2022-03-10 DIAGNOSIS — L97509 Non-pressure chronic ulcer of other part of unspecified foot with unspecified severity: Secondary | ICD-10-CM

## 2022-03-10 DIAGNOSIS — E11621 Type 2 diabetes mellitus with foot ulcer: Secondary | ICD-10-CM

## 2022-03-10 DIAGNOSIS — M79605 Pain in left leg: Secondary | ICD-10-CM

## 2022-03-10 DIAGNOSIS — I70213 Atherosclerosis of native arteries of extremities with intermittent claudication, bilateral legs: Secondary | ICD-10-CM

## 2022-03-10 DIAGNOSIS — Z794 Long term (current) use of insulin: Secondary | ICD-10-CM | POA: Diagnosis not present

## 2022-03-11 ENCOUNTER — Encounter: Payer: Self-pay | Admitting: Internal Medicine

## 2022-03-13 MED ORDER — TRAMADOL HCL 50 MG PO TABS: 50.0000 mg | ORAL_TABLET | Freq: Four times a day (QID) | ORAL | 0 refills | Status: DC | PRN

## 2022-03-14 LAB — VAS US ABI WITH/WO TBI
Left ABI: 1.15
Right ABI: 1.25

## 2022-03-17 NOTE — Progress Notes (Signed)
03/18/22 10:18 AM   Randy [name] [birth year] [id number]  Referring provider:  [facility], [name],  [name] [id number]  Urological history  1. BPH w/ LUTS - PSA (10/2021) 1.4  -TRUS (11/2021) - 83 cc - cysto (11/2021) - Enlarged prostate bilobar coaptation - slight intravesical protrusion of kissing lateral lobes, no median lobe  - IPSS 8/2 - PVR 200 mL - tamsulosin 0.4 mg and finasteride 5 mg daily  2. Nocturia -Risk factors for nocturia: obstructive sleep apnea, hypertension, diabetes and BPH -untreated sleep apnea   HPI: [facility] [name] is a 72 y.o.male who presents today for three month follow up.   He has not received his new CPAP machine.  He is taking the tamsulosin, but he states he has not been taking the finasteride.  He is also having issues with his left foot and is seeing vascular for this.  Patient denies any modifying or aggravating factors.  Patient denies any gross hematuria, dysuria or suprapubic/flank pain.  Patient denies any fevers, chills, nausea or vomiting.     I PSS 8/2  PVR 200 mL    IPSS     Row Name 03/18/22 0900         International Prostate Symptom Score   How often have you had the sensation of not emptying your bladder? Not at All     How often have you had to urinate less than every two hours? About half the time     How often have you found you stopped and started again several times when you urinated? Less than 1 in 5 times     How often have you found it difficult to postpone urination? Not at All     How often have you had a weak urinary stream? Not at All     How often have you had to strain to start urination? Not at All     How many times did you typically get up at night to urinate? 4 Times     Total IPSS Score 8       Quality of Life due to urinary symptoms   If you were to spend the rest of your life with your urinary condition just the way it is now how would you feel  about that? Mostly Satisfied               Score:  1-7 Mild 8-19 Moderate 20-35 Severe      PMH: Past Medical History:  Diagnosis Date   Diabetes mellitus without complication ([name])    Hypercholesteremia    Hypertension    Sleep apnea     Surgical History: Past Surgical History:  Procedure Laterality Date   COLONOSCOPY WITH PROPOFOL N/A 01/04/2022   Procedure: COLONOSCOPY WITH PROPOFOL;  Surgeon: Randy Bellows, MD;  [name];  Service: Gastroenterology;  Laterality: N/A;   ESOPHAGOGASTRODUODENOSCOPY N/A 01/04/2022   Procedure: ESOPHAGOGASTRODUODENOSCOPY (EGD);  Surgeon: Randy Bellows, MD;  [name];  Service: Gastroenterology;  Laterality: N/A;   HERNIA REPAIR      Home Medications:  Allergies as of 03/18/2022   No Known Allergies      Medication List        Accurate as of March 18, 2022 10:18 AM. If you have any questions, ask your nurse or doctor.          Accu-Chek Aviva Plus test strip Generic drug: glucose blood   albuterol 108 (90 Base)  MCG/ACT inhaler Commonly known as: VENTOLIN HFA Inhale into the lungs.   allopurinol 100 MG tablet Commonly known as: ZYLOPRIM Take 1 tablet by mouth daily.   aspirin 81 MG tablet Take 81 mg by mouth daily.   atorvastatin 40 MG tablet Commonly known as: LIPITOR Take 40 mg by mouth daily.   BD Insulin Syringe U/F 31G X 5/16" 0.5 ML Misc Generic drug: Insulin Syringe-Needle U-100 4 (four) times daily.   cyanocobalamin 1000 MCG tablet Commonly known as: VITAMIN B12 Take 1,000 mcg by mouth daily.   finasteride 5 MG tablet Commonly known as: PROSCAR Take 1 tablet (5 mg total) by mouth daily. Started by: Randy Council, PA-C   fluticasone 50 MCG/ACT nasal spray Commonly known as: FLONASE Place 2 sprays into both nostrils daily.   insulin glargine 100 UNIT/ML injection Commonly known as: LANTUS Inject 50 Units into the skin 2 (two) times daily.   insulin lispro 100  UNIT/ML injection Commonly known as: HUMALOG Inject 30 Units into the skin 3 (three) times daily with meals.   lisinopril 10 MG tablet Commonly known as: ZESTRIL Take 10 mg by mouth daily.   naproxen 500 MG tablet Commonly known as: Naprosyn Take 1 tablet (500 mg total) by mouth 2 (two) times daily with a meal.   pregabalin 100 MG capsule Commonly known as: LYRICA Take 100 mg by mouth 2 (two) times daily. What changed: Another medication with the same name was removed. Continue taking this medication, and follow the directions you see here. Changed by: Randy Council, PA-C   tamsulosin 0.4 MG Caps capsule Commonly known as: FLOMAX TAKE 1 CAPSULE BY MOUTH EVERY DAY 30 MINUTES AFTER THE SAME MEAL   traMADol 50 MG tablet Commonly known as: ULTRAM Take 1 tablet (50 mg total) by mouth every 6 (six) hours as needed.        Allergies:  No Known Allergies  Family History: Family History  Problem Relation Age of Onset   Prostate cancer Neg Hx    Bladder Cancer Neg Hx    Kidney cancer Neg Hx     Social History:  reports that he quit smoking about 30 years ago. His smoking use included cigarettes. He started smoking about 55 years ago. He has a 25.00 pack-year smoking history. He has been exposed to tobacco smoke. He has never used smokeless tobacco. He reports current alcohol use. He reports that he does not use drugs.   Physical Exam: BP (!) 159/75   Pulse 90   Ht '5\' 8"'$  (1.727 m)   Wt 250 lb (113.4 kg)   BMI 38.01 kg/m   Constitutional:  Well nourished. Alert and oriented, No acute distress. HEENT: [place] AT, moist mucus membranes.  Trachea midline Cardiovascular: No clubbing, cyanosis, or edema. Respiratory: Normal respiratory effort, no increased work of breathing. Neurologic: Grossly intact, no focal deficits, moving all 4 extremities. Psychiatric: Normal mood and affect.   Laboratory Data: N/A    Pertinent Imaging:  03/18/22 10:09  Scan Result 269m       Assessment & Plan:    1. BPH with LUTS -Patient has been taking the tamsulosin, but he states he has not been taking the finasteride -We will go ahead and restart the finasteride and have her return in 3 months for IPSS and PVR -He is having issues with his right foot and is seeing vascular for this, so he is wanting to have those issues resolved before considering a bladder outlet procedure  2. Nocturia -He  is still not yet received his new CPAP machine -He states his primary care physician is working on it    Return in 3 months (on 06/17/2022) for three months for ipss and pvr, IPSS and PVR.  Tyrena Gohr, [place] [address] [place], [place] [address] [phone]

## 2022-03-18 ENCOUNTER — Ambulatory Visit (INDEPENDENT_AMBULATORY_CARE_PROVIDER_SITE_OTHER): Payer: 59 | Admitting: Urology

## 2022-03-18 ENCOUNTER — Encounter: Payer: Self-pay | Admitting: Urology

## 2022-03-18 ENCOUNTER — Encounter (INDEPENDENT_AMBULATORY_CARE_PROVIDER_SITE_OTHER): Payer: Self-pay | Admitting: Nurse Practitioner

## 2022-03-18 VITALS — BP 159/75 | HR 90 | Ht 68.0 in | Wt 250.0 lb

## 2022-03-18 DIAGNOSIS — R351 Nocturia: Secondary | ICD-10-CM | POA: Diagnosis not present

## 2022-03-18 DIAGNOSIS — N401 Enlarged prostate with lower urinary tract symptoms: Secondary | ICD-10-CM | POA: Diagnosis not present

## 2022-03-18 DIAGNOSIS — N138 Other obstructive and reflux uropathy: Secondary | ICD-10-CM

## 2022-03-18 LAB — BLADDER SCAN AMB NON-IMAGING

## 2022-03-18 MED ORDER — FINASTERIDE 5 MG PO TABS: 5.0000 mg | ORAL_TABLET | Freq: Every day | ORAL | 3 refills | Status: DC

## 2022-03-23 ENCOUNTER — Emergency Department: Payer: 59

## 2022-03-23 ENCOUNTER — Other Ambulatory Visit: Payer: Self-pay

## 2022-03-23 ENCOUNTER — Emergency Department
Admission: EM | Admit: 2022-03-23 | Discharge: 2022-03-23 | Disposition: A | Discharge status: Home / Self Care | Payer: 59 | Attending: Emergency Medicine | Admitting: Emergency Medicine

## 2022-03-23 DIAGNOSIS — I10 Essential (primary) hypertension: Secondary | ICD-10-CM | POA: Diagnosis not present

## 2022-03-23 DIAGNOSIS — M79672 Pain in left foot: Secondary | ICD-10-CM | POA: Diagnosis present

## 2022-03-23 DIAGNOSIS — L03116 Cellulitis of left lower limb: Secondary | ICD-10-CM

## 2022-03-23 DIAGNOSIS — E119 Type 2 diabetes mellitus without complications: Secondary | ICD-10-CM | POA: Insufficient documentation

## 2022-03-23 LAB — COMPREHENSIVE METABOLIC PANEL
ALT: 20 U/L (ref 0–44)
AST: 19 U/L (ref 15–41)
Albumin: 3.9 g/dL (ref 3.5–5.0)
Alkaline Phosphatase: 77 U/L (ref 38–126)
Anion gap: 11 (ref 5–15)
BUN: 8 mg/dL (ref 8–23)
CO2: 23 mmol/L (ref 22–32)
Calcium: 9.2 mg/dL (ref 8.9–10.3)
Chloride: 103 mmol/L (ref 98–111)
Creatinine, Ser: 0.74 mg/dL (ref 0.61–1.24)
GFR, Estimated: >60 mL/min (ref >60)
Glucose, Bld: 146 mg/dL — ABNORMAL HIGH (ref 70–99)
Potassium: 3.6 mmol/L (ref 3.5–5.1)
Sodium: 137 mmol/L (ref 135–145)
Total Bilirubin: 0.6 mg/dL (ref 0.3–1.2)
Total Protein: 7.5 g/dL (ref 6.5–8.1)

## 2022-03-23 LAB — CBC WITH DIFFERENTIAL/PLATELET
Abs Immature Granulocytes: 0.03 10*3/uL (ref 0.00–0.07)
Basophils Absolute: 0.1 10*3/uL (ref 0.0–0.1)
Basophils Relative: 1 %
Eosinophils Absolute: 0.5 10*3/uL (ref 0.0–0.5)
Eosinophils Relative: 8 %
HCT: 40.6 % (ref 39.0–52.0)
Hemoglobin: 13.5 g/dL (ref 13.0–17.0)
Immature Granulocytes: 1 %
Lymphocytes Relative: 33 %
Lymphs Abs: 2 10*3/uL (ref 0.7–4.0)
MCH: 27.5 pg (ref 26.0–34.0)
MCHC: 33.3 g/dL (ref 30.0–36.0)
MCV: 82.7 fL (ref 80.0–100.0)
Monocytes Absolute: 0.7 10*3/uL (ref 0.1–1.0)
Monocytes Relative: 11 %
Neutro Abs: 2.9 10*3/uL (ref 1.7–7.7)
Neutrophils Relative %: 46 %
Platelets: 282 10*3/uL (ref 150–400)
RBC: 4.91 MIL/uL (ref 4.22–5.81)
RDW: 15.6 % — ABNORMAL HIGH (ref 11.5–15.5)
WBC: 6.1 10*3/uL (ref 4.0–10.5)
nRBC: 0 % (ref 0.0–0.2)

## 2022-03-23 MED ORDER — CEPHALEXIN 500 MG PO CAPS: 500.0000 mg | ORAL_CAPSULE | Freq: Three times a day (TID) | ORAL | 0 refills | Status: DC

## 2022-03-23 MED ORDER — NYSTATIN 100000 UNIT/GM EX OINT: 1.0000 | TOPICAL_OINTMENT | Freq: Two times a day (BID) | CUTANEOUS | 1 refills | Status: AC

## 2022-03-23 MED ORDER — HYDROCODONE-ACETAMINOPHEN 5-325 MG PO TABS
1.0000 | ORAL_TABLET | Freq: Once | ORAL | Status: AC
  Administered 2022-03-23: 1 via ORAL
  Filled 2022-03-23: qty 1

## 2022-03-23 MED ORDER — SULFAMETHOXAZOLE-TRIMETHOPRIM 800-160 MG PO TABS: 1.0000 | ORAL_TABLET | Freq: Two times a day (BID) | ORAL | 0 refills | Status: DC

## 2022-03-23 NOTE — ED Triage Notes (Signed)
C>O left foot pain x 3 weeks.  Seen by PCP and told to put a brace on it.  Has appointment with Podiatry on February 8th.  An area of red discoloration seen to anterior aspect of foot.  Toes feel numb, per patient.  Foot is warm and dry.  Brisk capillary refill.  +1 swelling to foot.  Patient states symptoms have been on going x 3 weeks.

## 2022-03-23 NOTE — Discharge Instructions (Addendum)
Your foot xray looks normal today.  Your blood tests were also normal.  Take oral antibiotics as prescribed to treat the skin infection on your foot, and apply the topical antifungal medicine to the area twice a day.

## 2022-03-23 NOTE — ED Provider Notes (Signed)
[facility], [place] Provider Note    Event Date/Time   First MD Initiated Contact with Patient 03/23/22 [phone]     (approximate)   History   Chief Complaint: Foot Pain   HPI  Randy [name] is a 72 y.o. male with a history of hypertension, diabetes, obesity who comes ED complaining of left foot pain for the past 3 weeks.  He recently was evaluated by vascular surgery, reviewing their notes shows that they did not find any suspicion for a vascular cause of his symptoms.  Patient denies chest pain shortness of breath fever.       Physical Exam   Triage Vital Signs: ED Triage Vitals  Enc Vitals Group     BP 03/23/22 0844 (!) 160/76     Pulse Rate 03/23/22 0844 82     Resp 03/23/22 0844 16     Temp 03/23/22 0844 97.9 F (36.6 C)     Temp Source 03/23/22 0844 Oral     SpO2 03/23/22 0844 96 %     Weight 03/23/22 0841 249 lb 1.9 oz (113 kg)     Height 03/23/22 0841 '5\' 8"'$  (1.727 m)     Head Circumference --      Peak Flow --      Pain Score 03/23/22 0841 10     Pain Loc --      Pain Edu? --      Excl. in [place]? --     Most recent vital signs: Vitals:   03/23/22 0844  BP: (!) 160/76  Pulse: 82  Resp: 16  Temp: 97.9 F (36.6 C)  SpO2: 96%    General: Awake, no distress.  CV:  Good peripheral perfusion.  Regular rate, normal DP pulse. Resp:  Normal effort.  Abd:  No distention.  Other:  Trace edema diffusely in bilateral lower extremities.  No calf tenderness.  Symmetric calf circumference.  Left dorsal forefoot has a 3 cm area of erythema with irregular margin that is more prominent with relative central clearing, some satellite lesions.  The area appears dry and scaly.  It is very tender to the touch.  There is no crepitus.  No streaking.   ED Results / Procedures / Treatments   Labs (all labs ordered are listed, but only abnormal results are displayed) Labs Reviewed  CBC WITH DIFFERENTIAL/PLATELET - Abnormal; Notable for the following components:       Result Value   RDW 15.6 (*)    All other components within normal limits  COMPREHENSIVE METABOLIC PANEL - Abnormal; Notable for the following components:   Glucose, Bld 146 (*)    All other components within normal limits     EKG    RADIOLOGY X-ray left foot interpreted by me, negative for fracture or bone destruction.  Radiology report reviewed   PROCEDURES:  Procedures   MEDICATIONS ORDERED IN ED: Medications  HYDROcodone-acetaminophen (NORCO/VICODIN) 5-325 MG per tablet 1 tablet (1 tablet Oral Given 03/23/22 1044)     IMPRESSION / MDM / ASSESSMENT AND PLAN / ED COURSE  I reviewed the triage vital signs and the nursing notes.                              Differential diagnosis includes, but is not limited to, cellulitis, fungal skin infection, foot fracture.  Doubt DVT, abscess, necrotizing fasciitis, or septic arthritis.  Patient's presentation is most consistent with acute presentation with potential  threat to life or bodily function.  Patient presents with ongoing pain of the left foot for the past few weeks.  Vital signs are unremarkable.  Serum labs are normal.  X-ray of the area is unremarkable.  I think he is developing a small area of bacterial versus fungal skin infection.  He is not septic and is appropriate for outpatient management with oral/topical antimicrobials      FINAL CLINICAL IMPRESSION(S) / ED DIAGNOSES   Final diagnoses:  Cellulitis of left lower extremity     Rx / DC Orders   ED Discharge Orders          Ordered    cephALEXin (KEFLEX) 500 MG capsule  3 times daily        03/23/22 1051    sulfamethoxazole-trimethoprim (BACTRIM DS) 800-160 MG tablet  2 times daily        03/23/22 1051    nystatin ointment (MYCOSTATIN)  2 times daily        03/23/22 1051             Note:  This document was prepared using Dragon voice recognition software and may include unintentional dictation errors.   Carrie Mew, MD 03/23/22  1057

## 2022-03-28 ENCOUNTER — Encounter (INDEPENDENT_AMBULATORY_CARE_PROVIDER_SITE_OTHER): Payer: Medicare Other

## 2022-03-28 ENCOUNTER — Encounter (INDEPENDENT_AMBULATORY_CARE_PROVIDER_SITE_OTHER): Payer: Medicare Other | Admitting: Vascular Surgery

## 2022-04-04 ENCOUNTER — Other Ambulatory Visit (INDEPENDENT_AMBULATORY_CARE_PROVIDER_SITE_OTHER): Payer: Self-pay | Admitting: Nurse Practitioner

## 2022-04-07 ENCOUNTER — Ambulatory Visit (INDEPENDENT_AMBULATORY_CARE_PROVIDER_SITE_OTHER): Payer: 59 | Admitting: Vascular Surgery

## 2022-04-13 DIAGNOSIS — M47816 Spondylosis without myelopathy or radiculopathy, lumbar region: Secondary | ICD-10-CM | POA: Insufficient documentation

## 2022-04-13 NOTE — Progress Notes (Signed)
MRN : [id number]  Randy [name] is a 72 y.o. (13-Apr-1950) male who presents with chief complaint of check circulation.  History of Present Illness:    The patient is seen for evaluation of painful lower extremities. Patient notes the pain is variable and not always associated with activity.  It is associated with swelling as well.  The pain is somewhat consistent day to day occurring on most days. The patient notes the pain also occurs with standing and routinely seems worse as the day wears on. The pain has been progressive over the past several weeks.  The area that is most painful is the anterior shin portion down to his foot.  The pain is worse with dorsiflexion.  The patient states these symptoms are causing  a negative impact on quality of life and daily activities which was a factor in the referral.   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    The patient denies rest pain or dangling of an extremity off the side of the bed during the night for relief. No open wounds or sores at this time. No history of DVT or phlebitis. No prior vascular interventions or surgeries.   The patient previously underwent a DVT study on 02/15/2022 which was negative for any DVT in the left lower extremity.   Previously the patient underwent ABIs which show a right ABI of 1.25 and a left 1.15.  These are both in the range of normal.  The patient has a TBI of 1.05 on the right and 0.86 on the left.  These are also considered to be normal TBI's.  He has strong triphasic tibial artery waveforms bilaterally with good toe waveforms bilaterally.  These are indicative of normal studies.  No outpatient medications have been marked as taking for the 04/14/22 encounter (Appointment) with Delana Meyer, Dolores Lory, MD.    Past Medical History:  Diagnosis Date   Diabetes mellitus without complication ([place])    Hypercholesteremia    Hypertension    Sleep apnea      Past Surgical History:  Procedure Laterality Date   COLONOSCOPY WITH PROPOFOL N/A 01/04/2022   Procedure: COLONOSCOPY WITH PROPOFOL;  Surgeon: [name], MD;  Location: [facility] ENDOSCOPY;  Service: Gastroenterology;  Laterality: N/A;   ESOPHAGOGASTRODUODENOSCOPY N/A 01/04/2022   Procedure: ESOPHAGOGASTRODUODENOSCOPY (EGD);  Surgeon: [name], MD;  Location: [facility] ENDOSCOPY;  Service: Gastroenterology;  Laterality: N/A;   HERNIA REPAIR      Social History Social History   Tobacco Use   Smoking status: Former    Packs/day: 1.00    Years: 25.00    Total pack years: 25.00    Types: Cigarettes    Start date: 03/01/1967    Quit date: 02/29/1992    Years since quitting: 30.1    Passive exposure: Past   Smokeless tobacco: Never  Vaping Use   Vaping Use: Never used  Substance Use Topics   Alcohol use: Yes    Comment: infreqently   Drug use: No    Family History Family History  Problem Relation Age of Onset   Prostate cancer Neg Hx    Bladder Cancer Neg Hx    Kidney cancer Neg Hx     No Known Allergies   REVIEW OF SYSTEMS (Negative unless checked)  Constitutional: []$ Weight loss  []$ Fever  []$ Chills Cardiac: []$ Chest pain   []$ Chest pressure   []$   Palpitations   []$ Shortness of breath when laying flat   []$ Shortness of breath with exertion. Vascular:  [x]$ Pain in legs with walking   []$ Pain in legs at rest  []$ History of DVT   []$ Phlebitis   []$ Swelling in legs   []$ Varicose veins   []$ Non-healing ulcers Pulmonary:   []$ Uses home oxygen   []$ Productive cough   []$ Hemoptysis   []$ Wheeze  []$ COPD   []$ Asthma Neurologic:  []$ Dizziness   []$ Seizures   []$ History of stroke   []$ History of TIA  []$ Aphasia   []$ Vissual changes   []$ Weakness or numbness in arm   []$ Weakness or numbness in leg Musculoskeletal:   []$ Joint swelling   []$ Joint pain   []$ Low back pain Hematologic:  []$ Easy bruising  []$ Easy bleeding   []$ Hypercoagulable state   []$ Anemic Gastrointestinal:  []$ Diarrhea   []$ Vomiting  []$ Gastroesophageal  reflux/heartburn   []$ Difficulty swallowing. Genitourinary:  []$ Chronic kidney disease   []$ Difficult urination  []$ Frequent urination   []$ Blood in urine Skin:  []$ Rashes   []$ Ulcers  Psychological:  []$ History of anxiety   []$  History of major depression.  Physical Examination  There were no vitals filed for this visit. There is no height or weight on file to calculate BMI. Gen: WD/WN, NAD Head: [place]/AT, No temporalis wasting.  Ear/Nose/Throat: Hearing grossly intact, nares w/o erythema or drainage Eyes: PER, EOMI, sclera nonicteric.  Neck: Supple, no masses.  No bruit or JVD.  Pulmonary:  Good air movement, no audible wheezing, no use of accessory muscles.  Cardiac: RRR, normal S1, S2, no Murmurs. Vascular:  2+ edema bilaterally no cellulitis Vessel Right Left  Radial Palpable Palpable  PT Not Palpable Not Palpable  DP Not Palpable Not Palpable  Gastrointestinal: soft, non-distended. No guarding/no peritoneal signs.  Musculoskeletal: M/S 5/5 throughout.  No visible deformity.  Neurologic: CN 2-12 intact. Pain and light touch intact in extremities.  Symmetrical.  Speech is fluent. Motor exam as listed above. Psychiatric: Judgment intact, Mood & affect appropriate for pt's clinical situation. Dermatologic: No rashes or ulcers noted.  No changes consistent with cellulitis.   CBC Lab Results  Component Value Date   WBC 6.1 03/23/2022   HGB 13.5 03/23/2022   HCT 40.6 03/23/2022   MCV 82.7 03/23/2022   PLT 282 03/23/2022    BMET    Component Value Date/Time   NA 137 03/23/2022 0850   NA 132 (L) 01/29/2013 1038   K 3.6 03/23/2022 0850   K 4.3 01/29/2013 1038   CL 103 03/23/2022 0850   CL 102 01/29/2013 1038   CO2 23 03/23/2022 0850   CO2 25 01/29/2013 1038   GLUCOSE 146 (H) 03/23/2022 0850   GLUCOSE 188 (H) 01/29/2013 1038   BUN 8 03/23/2022 0850   BUN 20 (H) 01/29/2013 1038   CREATININE 0.74 03/23/2022 0850   CREATININE 1.30 01/29/2013 1038   CALCIUM 9.2 03/23/2022 0850    CALCIUM 8.7 01/29/2013 1038   GFRNONAA >60 03/23/2022 0850   GFRNONAA 59 (L) 01/29/2013 1038   GFRAA >60 03/09/2018 0835   GFRAA >60 01/29/2013 1038   CrCl cannot be calculated (Patient's most recent lab result is older than the maximum 21 days allowed.).  COAG No results found for: "INR", "PROTIME"  Radiology DG Foot Complete Left  Result Date: 03/23/2022 CLINICAL DATA:  Left foot pain and swelling, dorsal erythema EXAM: LEFT FOOT - COMPLETE 3+ VIEW COMPARISON:  None available FINDINGS: Mild osseous demineralization. Joint spaces preserved. No acute fracture, dislocation, or bone destruction. Large plantar calcaneal spur Achilles insertion  enthesophyte. Small vessel vascular calcifications. IMPRESSION: No acute osseous abnormalities. Calcaneal spurring. Electronically Signed   By: Lavonia Dana M.D.   On: 03/23/2022 10:38     Assessment/Plan 1. Pain and swelling of lower leg, unspecified laterality Recommend:  I have had a long discussion with the patient regarding swelling and why it  causes symptoms.  Patient will begin wearing graduated compression on a daily basis a prescription was given. The patient will  wear the stockings first thing in the morning and removing them in the evening. The patient is instructed specifically not to sleep in the stockings.   In addition, behavioral modification will be initiated.  This will include frequent elevation, use of over the counter pain medications and exercise such as walking.  Consideration for a lymph pump will also be made based upon the effectiveness of conservative therapy.  This would help to improve the edema control and prevent sequela such as ulcers and infections   Patient should undergo duplex ultrasound of the venous system STAT to ensure that DVT.  The patient will follow-up with me after the ultrasound.  - VAS [place] LOWER EXTREMITY VENOUS (DVT); Future  2. Atherosclerosis of native artery of both lower extremities with  intermittent claudication (HCC)  Recommend:  The patient has evidence of atherosclerosis of the lower extremities with claudication.  The patient does not voice lifestyle limiting changes at this point in time.  Noninvasive studies do not suggest clinically significant change.  No invasive studies, angiography or surgery at this time The patient should continue walking and begin a more formal exercise program.  The patient should continue antiplatelet therapy and aggressive treatment of the lipid abnormalities  No changes in the patient's medications at this time  Continued surveillance is indicated as atherosclerosis is likely to progress with time.    The patient will continue follow up with noninvasive studies as ordered.   3. Osteoarthritis of spine with radiculopathy, lumbar region Continue NSAID medications as already ordered, these medications have been reviewed and there are no changes at this time.  Continued activity and therapy was stressed.  4. Type 2 diabetes mellitus with hyperglycemia, with long-term current use of insulin (HCC) Continue hypoglycemic medications as already ordered, these medications have been reviewed and there are no changes at this time.  Hgb A1C to be monitored as already arranged by primary service  5. Mixed hyperlipidemia Continue statin as ordered and reviewed, no changes at this time    Hortencia Pilar, MD  04/13/2022 2:54 PM

## 2022-04-14 ENCOUNTER — Encounter (INDEPENDENT_AMBULATORY_CARE_PROVIDER_SITE_OTHER): Payer: Self-pay | Admitting: Vascular Surgery

## 2022-04-14 ENCOUNTER — Other Ambulatory Visit (INDEPENDENT_AMBULATORY_CARE_PROVIDER_SITE_OTHER): Payer: Self-pay | Admitting: Vascular Surgery

## 2022-04-14 ENCOUNTER — Ambulatory Visit (INDEPENDENT_AMBULATORY_CARE_PROVIDER_SITE_OTHER): Payer: 59 | Admitting: Vascular Surgery

## 2022-04-14 ENCOUNTER — Ambulatory Visit (INDEPENDENT_AMBULATORY_CARE_PROVIDER_SITE_OTHER): Payer: 59

## 2022-04-14 VITALS — BP 144/77 | HR 82 | Resp 16 | Wt 260.8 lb

## 2022-04-14 DIAGNOSIS — E782 Mixed hyperlipidemia: Secondary | ICD-10-CM

## 2022-04-14 DIAGNOSIS — M79605 Pain in left leg: Secondary | ICD-10-CM

## 2022-04-14 DIAGNOSIS — E1165 Type 2 diabetes mellitus with hyperglycemia: Secondary | ICD-10-CM

## 2022-04-14 DIAGNOSIS — M25473 Effusion, unspecified ankle: Secondary | ICD-10-CM | POA: Insufficient documentation

## 2022-04-14 DIAGNOSIS — M2142 Flat foot [pes planus] (acquired), left foot: Secondary | ICD-10-CM | POA: Insufficient documentation

## 2022-04-14 DIAGNOSIS — M25572 Pain in left ankle and joints of left foot: Secondary | ICD-10-CM | POA: Insufficient documentation

## 2022-04-14 DIAGNOSIS — M79672 Pain in left foot: Secondary | ICD-10-CM | POA: Insufficient documentation

## 2022-04-14 DIAGNOSIS — M7989 Other specified soft tissue disorders: Secondary | ICD-10-CM | POA: Insufficient documentation

## 2022-04-14 DIAGNOSIS — M4726 Other spondylosis with radiculopathy, lumbar region: Secondary | ICD-10-CM | POA: Diagnosis not present

## 2022-04-14 DIAGNOSIS — M79669 Pain in unspecified lower leg: Secondary | ICD-10-CM | POA: Diagnosis not present

## 2022-04-14 DIAGNOSIS — I70213 Atherosclerosis of native arteries of extremities with intermittent claudication, bilateral legs: Secondary | ICD-10-CM

## 2022-04-14 DIAGNOSIS — M19072 Primary osteoarthritis, left ankle and foot: Secondary | ICD-10-CM | POA: Insufficient documentation

## 2022-04-14 DIAGNOSIS — Z794 Long term (current) use of insulin: Secondary | ICD-10-CM

## 2022-04-17 ENCOUNTER — Encounter (INDEPENDENT_AMBULATORY_CARE_PROVIDER_SITE_OTHER): Payer: Self-pay | Admitting: Vascular Surgery

## 2022-04-17 DIAGNOSIS — M79669 Pain in unspecified lower leg: Secondary | ICD-10-CM | POA: Insufficient documentation

## 2022-05-12 ENCOUNTER — Ambulatory Visit: Payer: 59 | Admitting: Orthopedic Surgery

## 2022-05-17 NOTE — Progress Notes (Unsigned)
Referring Physician:  Verlin Dike, MD [place],   [id number]  Primary Physician:  [facility]  History of Present Illness: 05/19/2022 Randy [name] has a history of DM, high cholesterol, OSA, obesity.   Was seen by vascular on 04/14/22 for bilateral leg pain. [place] of left LE was negative for DVT on 04/14/22.   He was seen at [facility] for foot/ankle pain on 04/14/22.   Looks like his PCP also referred him to podiatry for foot pain. He was given prednisone on 04/28/22.   He is complaining of 1 month history of constant bilateral leg pain form the knee down to the feet. This started after he had a shingles vaccine. He also has numbness and tingling in both feet. He notes a rash on his left foot and his right shin. No LBP. No alleviating/aggravating factors. He is not sleeping due to pain- hurts to have covers rest on his feet.   He had minimal relief with prednisone.   Bowel/Bladder Dysfunction: none  Conservative measures:  Physical therapy: has not participated in  Multimodal medical therapy including regular antiinflammatories: naproxen, flexeril, lyrica, voltaren gel, prednisone, ultram Injections: has not received any epidural steroid injections  Past Surgery: no spinal surgery  ROB NIDIFFER has no symptoms of cervical myelopathy.  The symptoms are causing a significant impact on the patient's life.   Review of Systems:  A 10 point review of systems is negative, except for the pertinent positives and negatives detailed in the HPI.  Past Medical History: Past Medical History:  Diagnosis Date   Diabetes mellitus without complication ([place])    Hypercholesteremia    Hypertension    Sleep apnea     Past Surgical History: Past Surgical History:  Procedure Laterality Date   COLONOSCOPY WITH PROPOFOL N/A 01/04/2022   Procedure: COLONOSCOPY WITH PROPOFOL;  Surgeon: [name], MD;  Location: [facility] ENDOSCOPY;  Service:  Gastroenterology;  Laterality: N/A;   ESOPHAGOGASTRODUODENOSCOPY N/A 01/04/2022   Procedure: ESOPHAGOGASTRODUODENOSCOPY (EGD);  Surgeon: [name], MD;  Location: [facility] ENDOSCOPY;  Service: Gastroenterology;  Laterality: N/A;   HERNIA REPAIR      Allergies: Allergies as of 05/19/2022   (No Known Allergies)    Medications: Outpatient Encounter Medications as of 05/19/2022  Medication Sig   ACCU-CHEK AVIVA PLUS test strip    albuterol (VENTOLIN HFA) 108 (90 Base) MCG/ACT inhaler Inhale into the lungs.   allopurinol (ZYLOPRIM) 100 MG tablet Take 1 tablet by mouth daily.   aspirin 81 MG tablet Take 81 mg by mouth daily.   atorvastatin (LIPITOR) 40 MG tablet Take 40 mg by mouth daily.   BD INSULIN SYRINGE U/F 31G X 5/16" 0.5 ML MISC 4 (four) times daily.   cyanocobalamin (VITAMIN B12) 1000 MCG tablet Take 1,000 mcg by mouth daily.   empagliflozin (JARDIANCE) 25 MG TABS tablet Take 25 mg by mouth daily.   finasteride (PROSCAR) 5 MG tablet Take 1 tablet (5 mg total) by mouth daily.   fluticasone (FLONASE) 50 MCG/ACT nasal spray Place 2 sprays into both nostrils daily.   insulin glargine (LANTUS) 100 UNIT/ML injection Inject 50 Units into the skin 2 (two) times daily.   insulin lispro (HUMALOG) 100 UNIT/ML injection Inject 30 Units into the skin 3 (three) times daily with meals.   lisinopril (PRINIVIL,ZESTRIL) 10 MG tablet Take 10 mg by mouth daily.   naproxen (NAPROSYN) 500 MG tablet Take 1 tablet (500 mg total) by mouth 2 (two) times  daily with a meal.   pregabalin (LYRICA) 100 MG capsule Take 100 mg by mouth 2 (two) times daily.   tamsulosin (FLOMAX) 0.4 MG CAPS capsule TAKE 1 CAPSULE BY MOUTH EVERY DAY 30 MINUTES AFTER THE SAME MEAL   traMADol (ULTRAM) 50 MG tablet Take 1 tablet (50 mg total) by mouth every 6 (six) hours as needed.   [DISCONTINUED] cephALEXin (KEFLEX) 500 MG capsule Take 1 capsule (500 mg total) by mouth 3 (three) times daily.   [DISCONTINUED] sulfamethoxazole-trimethoprim  (BACTRIM DS) 800-160 MG tablet Take 1 tablet by mouth 2 (two) times daily. (Patient not taking: Reported on 05/19/2022)   No facility-administered encounter medications on file as of 05/19/2022.    Social History: Social History   Tobacco Use   Smoking status: Former    Packs/day: 1.00    Years: 25.00    Additional pack years: 0.00    Total pack years: 25.00    Types: Cigarettes    Start date: 03/01/1967    Quit date: 02/29/1992    Years since quitting: 30.2    Passive exposure: Past   Smokeless tobacco: Never  Vaping Use   Vaping Use: Never used  Substance Use Topics   Alcohol use: Yes    Comment: infreqently   Drug use: No    Family Medical History: Family History  Problem Relation Age of Onset   Prostate cancer Neg Hx    Bladder Cancer Neg Hx    Kidney cancer Neg Hx     Physical Examination: There were no vitals filed for this visit.  General: Patient is well developed, well nourished, calm, collected, and in no apparent distress. Attention to examination is appropriate.  Respiratory: Patient is breathing without any difficulty.   NEUROLOGICAL:     Awake, alert, oriented to person, place, and time.  Speech is clear and fluent. Fund of knowledge is appropriate.   Cranial Nerves: Pupils equal round and reactive to light.  Facial tone is symmetric.    Strength: Side Biceps Triceps Deltoid Interossei Grip Wrist Ext. Wrist Flex.  R 5 5 5 5 5 5 5   L 5 5 5 5 5 5 5    Side Iliopsoas Quads Hamstring PF DF EHL  R 5 5 5 5 5 5   L 5 5 5 5  - -   Difficult to test strength left DF/EHL due to pain. Foot is warm to touch. He can wiggle his toes.  Reflexes are 2+ and symmetric at the biceps, triceps, brachioradialis.   Hoffman's is absent.  Clonus is not present in right leg, not tested on right side due to pain.   Bilateral upper and lower extremity sensation is intact to light touch, but diminished in left foot/ankle.   He ambulates with a cane.        Medical  Decision Making  Imaging: No lumbar imaging available.   Assessment and Plan: Randy [name] is a pleasant 72 y.o. male has 1 month history of constant bilateral leg pain form the knee down to the feet that started after he had shingles vaccine. He also has numbness and tingling in both feet. No LBP. He is not sleeping due to pain- hurts to have covers rest on his feet.   No lumbar imaging done, however this does not appear to be lumbar mediated. He may be developing CRPS or have underlying neuropathy. He has seen ortho and vascular along with his PCP. Patient reviewed with Dr. Izora Ribas as well.   Treatment options discussed with  patient and following plan made:   - Recommend he follow back up with his PCP. May also consider referral to dermatology but I will defer to PCP.  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 Message to Dr. Holley Raring.  - Continue on current medications from PCP including lyrica.  - Follow up prn.   I spent a total of 25 minutes in face-to-face and non-face-to-face activities related to this patient's care today including review of outside records, review of imaging, review of symptoms, physical exam, discussion of differential diagnosis, discussion of treatment options, and documentation.   Thank you for involving me in the care of this patient.   Geronimo Boot PA-C Dept. of Neurosurgery

## 2022-05-19 ENCOUNTER — Encounter: Payer: Self-pay | Admitting: Orthopedic Surgery

## 2022-05-19 ENCOUNTER — Ambulatory Visit (INDEPENDENT_AMBULATORY_CARE_PROVIDER_SITE_OTHER): Payer: 59 | Admitting: Orthopedic Surgery

## 2022-05-19 VITALS — BP 130/74 | Ht 68.0 in | Wt 260.0 lb

## 2022-05-19 DIAGNOSIS — M79661 Pain in right lower leg: Secondary | ICD-10-CM

## 2022-05-19 DIAGNOSIS — M79662 Pain in left lower leg: Secondary | ICD-10-CM | POA: Diagnosis not present

## 2022-05-19 DIAGNOSIS — R200 Anesthesia of skin: Secondary | ICD-10-CM

## 2022-05-19 DIAGNOSIS — R202 Paresthesia of skin: Secondary | ICD-10-CM | POA: Diagnosis not present

## 2022-05-19 NOTE — Patient Instructions (Signed)
It was so nice to see you today. Thank you so much for coming in.    I do not think the pain in your legs/feet is coming from your back.   Please follow up with your PCP about your legs. I will send her a copy of my note. You may also need to see dermatology regarding the rash- ask PCP about this as well.   I want you to see pain management here in [name] (Dr. Holley Raring) for evaluation. They should call you to schedule an appointment or you can call them at [phone].   Please do not hesitate to call if you have any questions or concerns. You can also message me in [place].    Geronimo Boot PA-C [phone]

## 2022-05-24 ENCOUNTER — Encounter: Payer: Self-pay | Admitting: Student in an Organized Health Care Education/Training Program

## 2022-05-24 ENCOUNTER — Ambulatory Visit
Payer: 59 | Attending: Student in an Organized Health Care Education/Training Program | Admitting: Student in an Organized Health Care Education/Training Program

## 2022-05-24 VITALS — BP 140/69 | HR 85 | Temp 97.8°F | Resp 18 | Ht 68.0 in | Wt 241.0 lb

## 2022-05-24 DIAGNOSIS — Z7985 Long-term (current) use of injectable non-insulin antidiabetic drugs: Secondary | ICD-10-CM

## 2022-05-24 DIAGNOSIS — G5793 Unspecified mononeuropathy of bilateral lower limbs: Secondary | ICD-10-CM | POA: Diagnosis not present

## 2022-05-24 DIAGNOSIS — Z794 Long term (current) use of insulin: Secondary | ICD-10-CM | POA: Diagnosis not present

## 2022-05-24 DIAGNOSIS — E1140 Type 2 diabetes mellitus with diabetic neuropathy, unspecified: Secondary | ICD-10-CM | POA: Insufficient documentation

## 2022-05-24 DIAGNOSIS — M792 Neuralgia and neuritis, unspecified: Secondary | ICD-10-CM | POA: Insufficient documentation

## 2022-05-24 MED ORDER — GABAPENTIN 300 MG PO CAPS: 300.0000 mg | ORAL_CAPSULE | Freq: Three times a day (TID) | ORAL | 1 refills | Status: DC

## 2022-05-24 NOTE — Progress Notes (Signed)
Safety precautions to be maintained throughout the outpatient stay will include: orient to surroundings, keep bed in low position, maintain call bell within reach at all times, provide assistance with transfer out of bed and ambulation.  

## 2022-05-24 NOTE — Progress Notes (Signed)
Patient: Randy [name]  Service Category: E/M  Provider: Gillis Santa, MD  DOB: Mar 11, 1950  DOS: 05/24/2022  Referring Provider: Jola Babinski  MRN: [id number]  Setting: Ambulatory outpatient  PCP: [facility] [place]  Type: New Patient  Specialty: Interventional Pain Management    Location: Office  Delivery: Face-to-face     Primary Reason(s) for Visit: Encounter for initial evaluation of one or more chronic problems (new to examiner) potentially causing chronic pain, and posing a threat to normal musculoskeletal function. (Level of risk: High) CC: Foot Pain (left)  HPI  Randy [name] is a 72 y.o. year old, male patient, who comes for the first time to our practice referred by Geronimo Boot, PA-C for our initial evaluation of his chronic pain. He has Acquired trigger finger; Osteoarthritis of ankle; Diastasis recti; Abdominal wall pain in epigastric region; Chest pain; Iron deficiency anemia; B12 deficiency; Adenomatous polyp of colon; Atherosclerosis of native arteries of extremity with intermittent claudication ([place]); Essential hypertension; Diabetes ([place]); Hyperlipidemia; Degenerative joint disease (DJD) of lumbar spine; Pain and swelling of lower leg; Chronic painful diabetic neuropathy ([place]); Neuropathic pain of both feet; and Neuropathic pain on their problem list. Today he comes in for evaluation of his Foot Pain (left)  Pain Assessment: Location: Left Foot Radiating: up to left shin Onset: More than a month ago Duration: Chronic pain Quality: Aching, Burning, Tingling Severity: 10-Worst pain ever/10 (subjective, self-reported pain score)  Effect on ADL: limits daily activities, unable to rest well Timing: Constant Modifying factors: nothing BP: (!) 140/69  HR: 85  Onset and Duration: Sudden and Present longer than 3 months Cause of pain: Unknown Severity: No change since onset and NAS-11 at its worse: 10/10 Timing: Not influenced by the time of the  day Aggravating Factors: Working Alleviating Factors:  none listed Associated Problems: Swelling and Pain that does not allow patient to sleep Quality of Pain: Aching, Burning, Constant, Itching, Sharp, and Tender  Previous Treatments: The patient denies none listed  Randy [name] is being evaluated for possible interventional pain management therapies for the treatment of his chronic pain.   Randy [name] is a pleasant 72 year old male who presents with left shin, left ankle and left foot pain.  He states the pain started after his shingles vaccine approximately 2 months ago.  He has had a vascular workup which was negative for any DVT in his vascular studies were normal.  He has been evaluated by neurosurgery and was referred here for further management.  He is currently on Lyrica 100 mg twice daily which is not beneficial.  He has been on Lyrica for the last 2 months.  He denies use of gabapentin or duloxetine.  He states that he has difficulty moving his left foot.  No increase sensitivity.  Mild swelling of his left foot.  No allodynia or hyperesthesia.  He is a type II diabetic on insulin.  Meds   Current Outpatient Medications:    ACCU-CHEK AVIVA PLUS test strip, , Disp: , Rfl:    albuterol (VENTOLIN HFA) 108 (90 Base) MCG/ACT inhaler, Inhale into the lungs., Disp: , Rfl:    allopurinol (ZYLOPRIM) 100 MG tablet, Take 1 tablet by mouth daily., Disp: , Rfl:    aspirin 81 MG tablet, Take 81 mg by mouth daily., Disp: , Rfl:    atorvastatin (LIPITOR) 40 MG tablet, Take 40 mg by mouth daily., Disp: , Rfl:    BD INSULIN SYRINGE U/F 31G X 5/16" 0.5 ML MISC, 4 (four)  times daily., Disp: , Rfl:    cyanocobalamin (VITAMIN B12) 1000 MCG tablet, Take 1,000 mcg by mouth daily., Disp: , Rfl:    empagliflozin (JARDIANCE) 25 MG TABS tablet, Take 25 mg by mouth daily., Disp: , Rfl:    ferrous sulfate 325 (65 FE) MG EC tablet, Take 325 mg by mouth 3 (three) times daily with meals., Disp: , Rfl:    finasteride  (PROSCAR) 5 MG tablet, Take 1 tablet (5 mg total) by mouth daily., Disp: 90 tablet, Rfl: 3   fluticasone (FLONASE) 50 MCG/ACT nasal spray, Place 2 sprays into both nostrils daily., Disp: , Rfl:    gabapentin (NEURONTIN) 300 MG capsule, Take 1 capsule (300 mg total) by mouth every 8 (eight) hours., Disp: 90 capsule, Rfl: 1   insulin glargine (LANTUS) 100 UNIT/ML injection, Inject 50 Units into the skin 2 (two) times daily., Disp: , Rfl:    insulin lispro (HUMALOG) 100 UNIT/ML injection, Inject 30 Units into the skin 3 (three) times daily with meals., Disp: , Rfl:    lisinopril (PRINIVIL,ZESTRIL) 10 MG tablet, Take 10 mg by mouth daily., Disp: , Rfl:    tamsulosin (FLOMAX) 0.4 MG CAPS capsule, TAKE 1 CAPSULE BY MOUTH EVERY DAY 30 MINUTES AFTER THE SAME MEAL, Disp: , Rfl:    traMADol (ULTRAM) 50 MG tablet, Take 1 tablet (50 mg total) by mouth every 6 (six) hours as needed., Disp: 20 tablet, Rfl: 0  Imaging Review    Narrative CLINICAL DATA:  Status post fall.  Syncope.  Depression.  EXAM: CT HEAD WITHOUT CONTRAST  CT CERVICAL SPINE WITHOUT CONTRAST  TECHNIQUE: Multidetector CT imaging of the head and cervical spine was performed following the standard protocol without intravenous contrast. Multiplanar CT image reconstructions of the cervical spine were also generated.  COMPARISON:  CT head 01/29/2013.  FINDINGS: CT HEAD FINDINGS  Brain:  Patchy and confluent areas of decreased attenuation are noted throughout the deep and periventricular white matter of the cerebral hemispheres bilaterally, compatible with chronic microvascular ischemic disease. No evidence of large-territorial acute infarction. No parenchymal hemorrhage. No mass lesion. No extra-axial collection.  No mass effect or midline shift. No hydrocephalus. Basilar cisterns are patent.  Vascular: No hyperdense vessel. Atherosclerotic calcifications are present within the cavernous internal carotid arteries.  Skull:  No acute fracture or focal lesion.  Sinuses/Orbits: Paranasal sinuses and mastoid air cells are clear. The orbits are unremarkable.  Other: None.  CT CERVICAL SPINE FINDINGS  Alignment: Slight reversal normal cervical lordosis likely due to positioning.  Skull base and vertebrae: Multilevel degenerative changes of the spine. No acute fracture. No aggressive appearing focal osseous lesion or focal pathologic process.  Soft tissues and spinal canal: No prevertebral fluid or swelling. No visible canal hematoma.  Disc levels:  Maintained.  Upper chest: Unremarkable.  Other: At least mild to moderate carotid artery calcifications within the neck.  IMPRESSION: 1. No acute intracranial abnormality. 2. No acute displaced fracture or traumatic listhesis of the cervical spine.   Electronically Signed By: Iven Finn M.D. On: 03/06/2020 21:58  DG Shoulder Left  Narrative CLINICAL DATA:  Left shoulder pain.  EXAM: LEFT SHOULDER - 2+ VIEW  COMPARISON:  04/20/2014  FINDINGS: Left shoulder is located without a fracture. Normal alignment at the left [facility] joint. Mild degenerative changes at the left [facility] joint. Visualized left ribs are intact. Evidence for calcifications or loose bodies around the left shoulder. Degenerative changes involving the inferior glenohumeral joint.  IMPRESSION: 1. No acute abnormality. 2.  Degenerative changes in left shoulder.   Electronically Signed By: Markus Daft M.D. On: 10/23/2020 09:09    Narrative CLINICAL DATA:  Left foot pain and swelling, dorsal erythema  EXAM: LEFT FOOT - COMPLETE 3+ VIEW  COMPARISON:  None available  FINDINGS: Mild osseous demineralization.  Joint spaces preserved.  No acute fracture, dislocation, or bone destruction.  Large plantar calcaneal spur  Achilles insertion enthesophyte.  Small vessel vascular calcifications.  IMPRESSION: No acute osseous abnormalities.  Calcaneal  spurring.   Electronically Signed By: Lavonia Dana M.D. On: 03/23/2022 10:38     Complexity Note: Imaging results reviewed.                         ROS  Cardiovascular: Daily Aspirin intake and High blood pressure Pulmonary or Respiratory: Snoring  Neurological: No reported neurological signs or symptoms such as seizures, abnormal skin sensations, urinary and/or fecal incontinence, being born with an abnormal open spine and/or a tethered spinal cord Psychological-Psychiatric: No reported psychological or psychiatric signs or symptoms such as difficulty sleeping, anxiety, depression, delusions or hallucinations (schizophrenial), mood swings (bipolar disorders) or suicidal ideations or attempts Gastrointestinal: No reported gastrointestinal signs or symptoms such as vomiting or evacuating blood, reflux, heartburn, alternating episodes of diarrhea and constipation, inflamed or scarred liver, or pancreas or irrregular and/or infrequent bowel movements Genitourinary: No reported renal or genitourinary signs or symptoms such as difficulty voiding or producing urine, peeing blood, non-functioning kidney, kidney stones, difficulty emptying the bladder, difficulty controlling the flow of urine, or chronic kidney disease Hematological: No reported hematological signs or symptoms such as prolonged bleeding, low or poor functioning platelets, bruising or bleeding easily, hereditary bleeding problems, low energy levels due to low hemoglobin or being anemic Endocrine: High blood sugar controlled without the use of insulin (NIDDM) Rheumatologic: No reported rheumatological signs and symptoms such as fatigue, joint pain, tenderness, swelling, redness, heat, stiffness, decreased range of motion, with or without associated rash Musculoskeletal: Negative for myasthenia gravis, muscular dystrophy, multiple sclerosis or malignant hyperthermia Work History: Disabled  Allergies  Mr. Schackmann has No Known  Allergies.  Laboratory Chemistry Profile   Renal Lab Results  Component Value Date   BUN 8 03/23/2022   CREATININE 0.74 03/23/2022   GFRAA >60 03/09/2018   GFRNONAA >60 03/23/2022   SPECGRAV 1.015 12/16/2021   PHUR 7.0 12/16/2021   PROTEINUR Negative 12/16/2021     Electrolytes Lab Results  Component Value Date   NA 137 03/23/2022   K 3.6 03/23/2022   CL 103 03/23/2022   CALCIUM 9.2 03/23/2022     Hepatic Lab Results  Component Value Date   AST 19 03/23/2022   ALT 20 03/23/2022   ALBUMIN 3.9 03/23/2022   ALKPHOS 77 03/23/2022   LIPASE 163 (H) 11/11/2020     ID Lab Results  Component Value Date   SARSCOV2NAA NEGATIVE 02/10/2021     Bone No results found for: "VD25OH", "VD125OH2TOT", "[id number]", "[id number]", "25OHVITD1", "25OHVITD2", "[id number]", "TESTOFREE", "TESTOSTERONE"   Endocrine Lab Results  Component Value Date   GLUCOSE 146 (H) 03/23/2022   GLUCOSEU Negative 12/16/2021     Neuropathy Lab Results  Component Value Date   VITAMINB12 676 02/09/2022   FOLATE 11.4 11/09/2021     CNS No results found for: "COLORCSF", "APPEARCSF", "RBCCOUNTCSF", "WBCCSF", "POLYSCSF", "LYMPHSCSF", "EOSCSF", "PROTEINCSF", "GLUCCSF", "JCVIRUS", "CSFOLI", "IGGCSF", "LABACHR", "ACETBL"   Inflammation (CRP: Acute  ESR: Chronic) No results found for: "CRP", "ESRSEDRATE", "LATICACIDVEN"   Rheumatology No results  found for: "RF", "ANA", "LABURIC", "URICUR", "LYMEIGGIGMAB", "LYMEABIGMQN", "HLAB27"   Coagulation Lab Results  Component Value Date   PLT 282 03/23/2022     Cardiovascular Lab Results  Component Value Date   TROPONINI <0.03 03/09/2018   HGB 13.5 03/23/2022   HCT 40.6 03/23/2022     Screening Lab Results  Component Value Date   SARSCOV2NAA NEGATIVE 02/10/2021     Cancer No results found for: "CEA", "CA125", "LABCA2"   Allergens No results found for: "ALMOND", "APPLE", "ASPARAGUS", "AVOCADO", "BANANA", "BARLEY", "BASIL", "BAYLEAF", "GREENBEAN",  "LIMABEAN", "WHITEBEAN", "BEEFIGE", "REDBEET", "BLUEBERRY", "BROCCOLI", "CABBAGE", "MELON", "CARROT", "CASEIN", "CASHEWNUT", "CAULIFLOWER", "CELERY"     Note: Lab results reviewed.  PFSH  Drug: Mr. Peoples  reports no history of drug use. Alcohol:  reports current alcohol use. Tobacco:  reports that he quit smoking about 30 years ago. His smoking use included cigarettes. He started smoking about 55 years ago. He has a 25.00 pack-year smoking history. He has been exposed to tobacco smoke. He has never used smokeless tobacco. Medical:  has a past medical history of Diabetes mellitus without complication ([place]), Hypercholesteremia, Hypertension, and Sleep apnea. Family: family history is not on file.  Past Surgical History:  Procedure Laterality Date   COLONOSCOPY WITH PROPOFOL N/A 01/04/2022   Procedure: COLONOSCOPY WITH PROPOFOL;  Surgeon: [name], MD;  Location: [facility] ENDOSCOPY;  Service: Gastroenterology;  Laterality: N/A;   ESOPHAGOGASTRODUODENOSCOPY N/A 01/04/2022   Procedure: ESOPHAGOGASTRODUODENOSCOPY (EGD);  Surgeon: [name], MD;  Location: [facility] ENDOSCOPY;  Service: Gastroenterology;  Laterality: N/A;   HERNIA REPAIR     Active Ambulatory Problems    Diagnosis Date Noted   Acquired trigger finger 08/07/2018   Osteoarthritis of ankle 08/07/2018   Diastasis recti 09/12/2019   Abdominal wall pain in epigastric region 09/12/2019   Chest pain 04/09/2002   Iron deficiency anemia 11/22/2021   B12 deficiency 11/22/2021   Adenomatous polyp of colon 01/04/2022   Atherosclerosis of native arteries of extremity with intermittent claudication ([place]) 03/09/2022   Essential hypertension 03/09/2022   Diabetes ([place]) 03/09/2022   Hyperlipidemia 03/09/2022   Degenerative joint disease (DJD) of lumbar spine 04/13/2022   Pain and swelling of lower leg 04/17/2022   Chronic painful diabetic neuropathy ([place]) 05/24/2022   Neuropathic pain of both feet 05/24/2022   Neuropathic pain 05/24/2022    Resolved Ambulatory Problems    Diagnosis Date Noted   No Resolved Ambulatory Problems   Past Medical History:  Diagnosis Date   Diabetes mellitus without complication ([place])    Hypercholesteremia    Hypertension    Sleep apnea    Constitutional Exam  General appearance: Well nourished, well developed, and well hydrated. In no apparent acute distress Vitals:   05/24/22 1314  BP: (!) 140/69  Pulse: 85  Resp: 18  Temp: 97.8 F (36.6 C)  TempSrc: Temporal  SpO2: 98%  Weight: 241 lb (109.3 kg)  Height: 5\' 8"  (1.727 m)   BMI Assessment: Estimated body mass index is 36.64 kg/m as calculated from the following:   Height as of this encounter: 5\' 8"  (1.727 m).   Weight as of this encounter: 241 lb (109.3 kg).  BMI interpretation table: BMI level Category Range association with higher incidence of chronic pain  <18 kg/m2 Underweight   18.5-24.9 kg/m2 Ideal body weight   25-29.9 kg/m2 Overweight Increased incidence by 20%  30-34.9 kg/m2 Obese (Class I) Increased incidence by 68%  35-39.9 kg/m2 Severe obesity (Class II) Increased incidence by 136%  >40 kg/m2 Extreme obesity (  Class III) Increased incidence by 254%   Patient's current BMI Ideal Body weight  Body mass index is 36.64 kg/m. Ideal body weight: 68.4 kg (150 lb 12.7 oz) Adjusted ideal body weight: 84.8 kg (186 lb 14 oz)   BMI Readings from Last 4 Encounters:  05/24/22 36.64 kg/m  05/19/22 39.53 kg/m  04/14/22 39.65 kg/m  03/23/22 37.88 kg/m   Wt Readings from Last 4 Encounters:  05/24/22 241 lb (109.3 kg)  05/19/22 260 lb (117.9 kg)  04/14/22 260 lb 12.8 oz (118.3 kg)  03/23/22 249 lb 1.9 oz (113 kg)    Psych/Mental status: Alert, oriented x 3 (person, place, & time)       Eyes: PERLA Respiratory: No evidence of acute respiratory distress  Left ankle and foot discoloration, mild swelling Difficulty with left foot inversion and eversion   Assessment  Primary Diagnosis & Pertinent Problem List: The  primary encounter diagnosis was Chronic painful diabetic neuropathy ([place]). Diagnoses of Neuropathic pain of both feet and Neuropathic pain were also pertinent to this visit.  Visit Diagnosis (New problems to examiner): 1. Chronic painful diabetic neuropathy ([place])   2. Neuropathic pain of both feet   3. Neuropathic pain    Plan of Care (Initial workup plan)  1. Chronic painful diabetic neuropathy (HCC) - gabapentin (NEURONTIN) 300 MG capsule; Take 1 capsule (300 mg total) by mouth every 8 (eight) hours.  Dispense: 90 capsule; Refill: 1 - NEUROLYSIS; Future  2. Neuropathic pain of both feet - gabapentin (NEURONTIN) 300 MG capsule; Take 1 capsule (300 mg total) by mouth every 8 (eight) hours.  Dispense: 90 capsule; Refill: 1 - NEUROLYSIS; Future  3. Neuropathic pain - gabapentin (NEURONTIN) 300 MG capsule; Take 1 capsule (300 mg total) by mouth every 8 (eight) hours.  Dispense: 90 capsule; Refill: 1 - NEUROLYSIS; Future    Procedure Orders         NEUROLYSIS     Pharmacotherapy (current): Medications ordered:  Meds ordered this encounter  Medications   gabapentin (NEURONTIN) 300 MG capsule    Sig: Take 1 capsule (300 mg total) by mouth every 8 (eight) hours.    Dispense:  90 capsule    Refill:  1    Fill one day early if pharmacy is closed on scheduled refill date. May substitute for generic if available.   Medications administered during this visit: [name] had no medications administered during this visit.  Future considerations could include trial of Cymbalta, lidocaine infusion. Less likely that this is CRPS as the patient does not meet [place] criteria, no noted allodynia, trophic changes, pseudomotor changes.  No traumatic event.  I did do a pub med search where there are some case studies of peripheral neuropathy after shingles vaccine.  Could consider skin biopsy to assess for any small nerve fiber changes as well as a nerve conduction velocity and EMG  study.   Provider-requested follow-up: Return in about 6 days (around 05/30/2022) for [place].  Future Appointments  Date Time Provider [place]  05/30/2022 10:40 AM Gillis Santa, MD [facility] None  06/13/2022 10:30 AM [facility] None  06/13/2022 10:45 AM Jane Canary, MD [facility] None  06/17/2022  9:30 AM McGowan, Larene [place] A, PA-C [facility] None    Duration of encounter: 50minutes.  Total time on encounter, as per AMA guidelines included both the face-to-face and non-face-to-face time personally spent by the physician and/or other qualified health care professional(s) on the day of the encounter (includes time in activities that  require the physician or other qualified health care professional and does not include time in activities normally performed by clinical staff). Physician's time may include the following activities when performed: Preparing to see the patient (e.g., pre-charting review of records, searching for previously ordered imaging, lab work, and nerve conduction tests) Review of prior analgesic pharmacotherapies. Reviewing PMP Interpreting ordered tests (e.g., lab work, imaging, nerve conduction tests) Performing post-procedure evaluations, including interpretation of diagnostic procedures Obtaining and/or reviewing separately obtained history Performing a medically appropriate examination and/or evaluation Counseling and educating the patient/family/caregiver Ordering medications, tests, or procedures Referring and communicating with other health care professionals (when not separately reported) Documenting clinical information in the electronic or other health record Independently interpreting results (not separately reported) and communicating results to the patient/ family/caregiver Care coordination (not separately reported)  Note by: Gillis Santa, MD (TTS technology used. I apologize for any typographical errors that were not detected and  corrected.) Date: 05/24/2022; Time: 4:11 PM

## 2022-05-24 NOTE — Patient Instructions (Signed)
Capsaicin Patches What is this medication? CAPSAICIN (cap SAY sin) relieves minor pain in your muscles and joints. It works by making your skin feel warm or cool, which blocks pain signals going to the brain. This medicine may be used for other purposes; ask your health care provider or pharmacist if you have questions. COMMON BRAND NAME(S): Qutenza What should I tell my care team before I take this medication? They need to know if you have any of these conditions: Broken or irritated skin High blood pressure History of heart attack or stroke An unusual or allergic reaction to capsaicin, hot peppers, other medications, foods, dyes, or preservatives Pregnant or trying to get pregnant Breast-feeding How should I use this medication? This medication is for external use only. It is applied by your care team in a hospital or clinic setting. Talk to your care team about the use of this medication in children. Special care may be needed. Overdosage: If you think you have taken too much of this medicine contact a poison control center or emergency room at once. NOTE: This medicine is only for you. Do not share this medicine with others. What if I miss a dose? This does not apply. What may interact with this medication? Interactions are not expected. Do not use any other skin products on the affected area without asking your care team. This list may not describe all possible interactions. Give your health care provider a list of all the medicines, herbs, non-prescription drugs, or dietary supplements you use. Also tell them if you smoke, drink alcohol, or use illegal drugs. Some items may interact with your medicine. What should I watch for while using this medication? Your condition will be monitored carefully while you are receiving this medication. Your blood pressure may go up during the procedure. Do not touch the medication patch during treatment. This medication causes red, burning skin. You  may need pain medication for during and after the procedure. This medication can make you more sensitive to heat for a few days after treatment. Be careful in hot showers or baths. Keep out of the sun. Exercise may make the treated skin feel hotter. Tell your care team if your symptoms do not start to get better or if they get worse. What side effects may I notice from receiving this medication? Side effects that you should report to your care team as soon as possible: Allergic reactions--skin rash, itching, hives, swelling of the face, lips, tongue, or throat Side effects that usually do not require medical attention (report these to your care team if they continue or are bothersome): Mild skin irritation, redness, or dryness This list may not describe all possible side effects. Call your doctor for medical advice about side effects. You may report side effects to FDA at 1-800-FDA-1088. Where should I keep my medication? This medication is given in a hospital or clinic. It will not be stored at home. NOTE: This sheet is a summary. It may not cover all possible information. If you have questions about this medicine, talk to your doctor, pharmacist, or health care provider.  2023 Elsevier/Gold Standard (2020-10-14 00:00:00)  

## 2022-05-30 ENCOUNTER — Ambulatory Visit
Payer: 59 | Attending: Student in an Organized Health Care Education/Training Program | Admitting: Student in an Organized Health Care Education/Training Program

## 2022-05-30 ENCOUNTER — Encounter: Payer: Self-pay | Admitting: Student in an Organized Health Care Education/Training Program

## 2022-05-30 VITALS — BP 154/80 | HR 77 | Temp 98.2°F | Resp 17 | Ht 68.0 in | Wt 241.0 lb

## 2022-05-30 DIAGNOSIS — M792 Neuralgia and neuritis, unspecified: Secondary | ICD-10-CM

## 2022-05-30 DIAGNOSIS — E1140 Type 2 diabetes mellitus with diabetic neuropathy, unspecified: Secondary | ICD-10-CM | POA: Insufficient documentation

## 2022-05-30 DIAGNOSIS — G5793 Unspecified mononeuropathy of bilateral lower limbs: Secondary | ICD-10-CM | POA: Insufficient documentation

## 2022-05-30 MED ORDER — CAPSAICIN-CLEANSING GEL 8 % EX KIT
2.0000 | PACK | Freq: Once | CUTANEOUS | Status: AC
  Administered 2022-05-30: 2 via TOPICAL

## 2022-05-30 NOTE — Progress Notes (Signed)
PROVIDER NOTE: Interpretation of information contained herein should be left to medically-trained personnel. Specific patient instructions are provided elsewhere under "Patient Instructions" section of medical record. This document was created in part using STT-dictation technology, any transcriptional errors that may result from this process are unintentional.  Patient: Randy [name] Type: Established DOB: 08-15-1950 MRN: [id number] PCP: [facility]  Service: Procedure DOS: 05/30/2022 Setting: Ambulatory Location: Ambulatory outpatient facility Delivery: Face-to-face Provider: Gillis Santa, MD Specialty: Interventional Pain Management Specialty designation: 09 Location: Outpatient facility Ref. Prov.: [facility]       Interventional Therapy   Interventional Treatment:           Type: Qutenza Neurolysis #1  Laterality:  Left Area treated: Feet Imaging Guidance: None Anesthesia/analgesia/anxiolysis/sedation: None required Medication (Right): Qutenza (capsaicin 8%) topical system Medication (Left): Qutenza (capsaicin 8%) topical system Date: 05/30/2022 Performed by: Gillis Santa, MD Rationale (medical necessity): procedure needed and proper for the treatment of Randy [name]'s medical symptoms and needs. 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 1. Chronic painful diabetic neuropathy   2. Neuropathic pain of both feet   3. Neuropathic pain    NAS-11 Pain score:   Pre-procedure: 10-Worst pain ever/10   Post-procedure: 10-Worst pain ever/10     Position / Prep / Materials:  Position: Supine  Materials: Qutenza Kit  Pre-op H&P Assessment:  Randy [name] is a 72 y.o. (year old), male patient, seen today for interventional treatment. He  has a past surgical history that includes Hernia repair; Esophagogastroduodenoscopy (N/A, 01/04/2022); and Colonoscopy with  propofol (N/A, 01/04/2022). Randy [name] has a current medication list which includes the following prescription(s): accu-chek aviva plus, albuterol, allopurinol, aspirin, atorvastatin, bd insulin syringe u/f, cyanocobalamin, empagliflozin, ferrous sulfate, finasteride, fluticasone, gabapentin, insulin glargine, insulin lispro, lisinopril, tamsulosin, and tramadol. His primarily concern today is the Foot Pain (left)  Initial Vital Signs:  Pulse/HCG Rate: 89  Temp: 97.7 F (36.5 C) Resp: 18 BP: (!) 143/68 SpO2: 99 %  BMI: Estimated body mass index is 36.64 kg/m as calculated from the following:   Height as of this encounter: 5\' 8"  (1.727 m).   Weight as of this encounter: 241 lb (109.3 kg).  Risk Assessment: Allergies: Reviewed. He has No Known Allergies.  Allergy Precautions: None required Coagulopathies: Reviewed. None identified.  Blood-thinner therapy: None at this time Active Infection(s): Reviewed. None identified. Randy [name] is afebrile  Site Confirmation: Randy [name] was asked to confirm the procedure and laterality before marking the site Procedure checklist: Completed Consent: Before the procedure and under the influence of no sedative(s), amnesic(s), or anxiolytics, the patient was informed of the treatment options, risks and possible complications. To fulfill our ethical and legal obligations, as recommended by the American Medical Association's Code of Ethics, I have informed the patient of my clinical impression; the nature and purpose of the treatment or procedure; the risks, benefits, and possible complications of the intervention; the alternatives, including doing nothing; the risk(s) and benefit(s) of the alternative treatment(s) or procedure(s); and the risk(s) and benefit(s) of doing nothing. The patient was provided information about the general risks and possible complications associated with the procedure. These may include, but are not limited to: failure to achieve  desired goals, infection, bleeding, organ or nerve damage, allergic reactions, paralysis, and death. In addition, the patient was informed of those risks and complications associated to the procedure, such as failure to decrease pain; infection; bleeding; organ  or nerve damage with subsequent damage to sensory, motor, and/or autonomic systems, resulting in permanent pain, numbness, and/or weakness of one or several areas of the body; allergic reactions; (i.e.: anaphylactic reaction); and/or death. Furthermore, the patient was informed of those risks and complications associated with the medications. These include, but are not limited to: allergic reactions (i.e.: anaphylactic or anaphylactoid reaction(s)); adrenal axis suppression; blood sugar elevation that in diabetics may result in ketoacidosis or comma; water retention that in patients with history of congestive heart failure may result in shortness of breath, pulmonary edema, and decompensation with resultant heart failure; weight gain; swelling or edema; medication-induced neural toxicity; particulate matter embolism and blood vessel occlusion with resultant organ, and/or nervous system infarction; and/or aseptic necrosis of one or more joints. Finally, the patient was informed that Medicine is not an exact science; therefore, there is also the possibility of unforeseen or unpredictable risks and/or possible complications that may result in a catastrophic outcome. The patient indicated having understood very clearly. We have given the patient no guarantees and we have made no promises. Enough time was given to the patient to ask questions, all of which were answered to the patient's satisfaction. Randy [name] has indicated that he wanted to continue with the procedure. Attestation: I, the ordering provider, attest that I have discussed with the patient the benefits, risks, side-effects, alternatives, likelihood of achieving goals, and potential problems  during recovery for the procedure that I have provided informed consent. Date  Time: 05/30/2022 10:32 AM  Pre-Procedure Preparation:  Monitoring: As per clinic protocol. Respiration, ETCO2, SpO2, BP, heart rate and rhythm monitor placed and checked for adequate function Safety Precautions: Patient was assessed for positional comfort and pressure points before starting the procedure. Time-out: I initiated and conducted the "Time-out" before starting the procedure, as per protocol. The patient was asked to participate by confirming the accuracy of the "Time Out" information. Verification of the correct person, site, and procedure were performed and confirmed by me, the nursing staff, and the patient. "Time-out" conducted as per Joint Commission's Universal Protocol (UP.01.01.01). Time: [id number] Start Time: 1041 hrs.  Description/Narrative of Procedure:          Region: Distal lower extremity Target Area: Sensory peripheral nerves affected by diabetic peripheral neuropathy Site: Feet Approach: Percutaneous  No./Series: Not applicable  Type: Percutaneous  Purpose: Therapeutic  Region: Distal lower extremities  Start Time: 1041 hrs.  Description of the Procedure: Protocol guidelines were followed. The patient was assisted into a comfortable position.  Informed consent was obtained in the patient monitored in the usual manner.  All questions were answered prior to the procedure.  They Qutenza patches were applied to the affected area and then covered with the wrap.  The Patient was kept under observation until the treatment was completed.  The patches were removed and the treated area was inspected.  Vitals:   05/30/22 1036 05/30/22 1140  BP: (!) 143/68 (!) 154/80  Pulse: 89 77  Resp: 18 17  Temp: 97.7 F (36.5 C) 98.2 F (36.8 C)  TempSrc: Temporal Temporal  SpO2: 99% 97%  Weight: 241 lb (109.3 kg)   Height: 5\' 8"  (1.727 m)      End Time: 1138 hrs.  Imaging Guidance:          Type of  Imaging Technique: None used Indication(s): N/A Exposure Time: No patient exposure Contrast: None used. Fluoroscopic Guidance: N/A Ultrasound Guidance: N/A Interpretation: N/A  Post-operative Assessment:  Post-procedure Vital Signs:  Pulse/HCG Rate:  77  Temp: 98.2 F (36.8 C) Resp: 17 BP: (!) 154/80 SpO2: 97 %  EBL: None  Complications: No immediate post-treatment complications observed by team, or reported by patient.  Note: The patient tolerated the entire procedure well. A repeat set of vitals were taken after the procedure and the patient was kept under observation following institutional policy, for this type of procedure. Post-procedural neurological assessment was performed, showing return to baseline, prior to discharge. The patient was provided with post-procedure discharge instructions, including a section on how to identify potential problems. Should any problems arise concerning this procedure, the patient was given instructions to immediately contact us, at any time, without hesitation. In any case, we plan to contact the patient by telephone for a follow-up status report regarding this interventional procedure.  Comments:  No additional relevant information.  Plan of Care (POC)  Orders:  No orders of the defined types were placed in this encounter.    Medications ordered for procedure: Meds ordered this encounter  Medications   capsaicin topical system 8 % patch 2 patch   Medications administered: We administered capsaicin topical system.  See the medical record for exact dosing, route, and time of administration.  Follow-up plan:   Return in about 6 weeks (around 07/11/2022) for Post Procedure Evaluation, in person.       Recent Visits Date Type Provider Dept  05/24/22 Office Visit Gillis Santa, MD [facility]  Showing recent visits within past 90 days and meeting all other requirements Today's Visits Date Type Provider Dept  05/30/22  Procedure visit Gillis Santa, MD [facility]  Showing today's visits and meeting all other requirements Future Appointments Date Type Provider Dept  07/11/22 Appointment Gillis Santa, MD [facility]  Showing future appointments within next 90 days and meeting all other requirements  Disposition: Discharge home  Discharge (Date  Time): 05/30/2022; 1145 hrs.   Primary Care Physician: [facility] [place] Location: [facility] Note by: Gillis Santa, MD (TTS technology used. I apologize for any typographical errors that were not detected and corrected.) Date: 05/30/2022; Time: 11:50 AM  Disclaimer:  Medicine is not an Chief Strategy Officer. The only guarantee in medicine is that nothing is guaranteed. It is important to note that the decision to proceed with this intervention was based on the information collected from the patient. The Data and conclusions were drawn from the patient's questionnaire, the interview, and the physical examination. Because the information was provided in large part by the patient, it cannot be guaranteed that it has not been purposely or unconsciously manipulated. Every effort has been made to obtain as much relevant data as possible for this evaluation. It is important to note that the conclusions that lead to this procedure are derived in large part from the available data. Always take into account that the treatment will also be dependent on availability of resources and existing treatment guidelines, considered by other Pain Management Practitioners as being common knowledge and practice, at the time of the intervention. For Medico-Legal purposes, it is also important to point out that variation in procedural techniques and pharmacological choices are the acceptable norm. The indications, contraindications, technique, and results of the above procedure should only be interpreted and judged by a Board-Certified  Interventional Pain Specialist with extensive familiarity and expertise in the same exact procedure and technique.

## 2022-05-30 NOTE — Progress Notes (Signed)
Safety precautions to be maintained throughout the outpatient stay will include: orient to surroundings, keep bed in low position, maintain call bell within reach at all times, provide assistance with transfer out of bed and ambulation.   Pt states he is tolerating patch therapy well.

## 2022-05-31 ENCOUNTER — Telehealth: Payer: Self-pay | Admitting: *Deleted

## 2022-05-31 NOTE — Telephone Encounter (Signed)
No problems post procedure. 

## 2022-06-13 ENCOUNTER — Inpatient Hospital Stay (HOSPITAL_BASED_OUTPATIENT_CLINIC_OR_DEPARTMENT_OTHER): Payer: 59 | Admitting: Internal Medicine

## 2022-06-13 ENCOUNTER — Inpatient Hospital Stay: Payer: 59 | Attending: Internal Medicine

## 2022-06-13 VITALS — BP 147/59 | HR 83 | Temp 97.2°F | Resp 20 | Wt 256.6 lb

## 2022-06-13 DIAGNOSIS — B9681 Helicobacter pylori [H. pylori] as the cause of diseases classified elsewhere: Secondary | ICD-10-CM | POA: Insufficient documentation

## 2022-06-13 DIAGNOSIS — D509 Iron deficiency anemia, unspecified: Secondary | ICD-10-CM

## 2022-06-13 DIAGNOSIS — Z79899 Other long term (current) drug therapy: Secondary | ICD-10-CM | POA: Insufficient documentation

## 2022-06-13 DIAGNOSIS — E538 Deficiency of other specified B group vitamins: Secondary | ICD-10-CM

## 2022-06-13 DIAGNOSIS — Z87891 Personal history of nicotine dependence: Secondary | ICD-10-CM | POA: Diagnosis not present

## 2022-06-13 LAB — CBC WITH DIFFERENTIAL/PLATELET
Abs Immature Granulocytes: 0.02 10*3/uL (ref 0.00–0.07)
Basophils Absolute: 0 10*3/uL (ref 0.0–0.1)
Basophils Relative: 1 %
Eosinophils Absolute: 0.4 10*3/uL (ref 0.0–0.5)
Eosinophils Relative: 7 %
HCT: 37.4 % — ABNORMAL LOW (ref 39.0–52.0)
Hemoglobin: 12.5 g/dL — ABNORMAL LOW (ref 13.0–17.0)
Immature Granulocytes: 0 %
Lymphocytes Relative: 31 %
Lymphs Abs: 1.9 10*3/uL (ref 0.7–4.0)
MCH: 28.3 pg (ref 26.0–34.0)
MCHC: 33.4 g/dL (ref 30.0–36.0)
MCV: 84.8 fL (ref 80.0–100.0)
Monocytes Absolute: 0.7 10*3/uL (ref 0.1–1.0)
Monocytes Relative: 12 %
Neutro Abs: 2.9 10*3/uL (ref 1.7–7.7)
Neutrophils Relative %: 49 %
Platelets: 339 10*3/uL (ref 150–400)
RBC: 4.41 MIL/uL (ref 4.22–5.81)
RDW: 14.6 % (ref 11.5–15.5)
WBC: 6 10*3/uL (ref 4.0–10.5)
nRBC: 0 % (ref 0.0–0.2)

## 2022-06-13 LAB — FERRITIN: Ferritin: 17 ng/mL — ABNORMAL LOW (ref 24–336)

## 2022-06-13 LAB — IRON AND TIBC
Iron: 53 ug/dL (ref 45–182)
Saturation Ratios: 16 % — ABNORMAL LOW (ref 17.9–39.5)
TIBC: 330 ug/dL (ref 250–450)
UIBC: 277 ug/dL

## 2022-06-13 NOTE — Progress Notes (Signed)
[facility]  [phone] [phone]) [phone]  ID: [id number] INGS OB: [birth year]  MR#: [id number]  SWH#:[id number]  Patient Care Team: [facility] as PCP - General (General Practice)  REFERRING PROVIDER: Dr. Wyline Mood  REASON FOR REFERRAL: IDA  HPI: Randy [name] is a 72 y.o. male with past medical history of diabetes, hypertension, hyperlipidemia, iron deficiency anemia and H. pylori positive was referred to hematology clinic for further management of iron deficiency anemia.  He has been taking oral iron since June 2023.  Iron panel in September 2023 did not show any improvement.  CBC with differential was reviewed.  Patient has anemia since January 2022.  Iron studies not available until recently on 11/09/2021 which showed ferritin of 12 and percent saturation of 8.  Folic acid is normal.  Vitamin B12 is low at 216.  He was started on oral vitamin B12 1000 mcg 2 weeks ago. H. pylori breath test was also positive and started on triple drug therapy.  Patient has been taking meloxicam once a day for long time.  He did not know the indication for the drug.  He stopped taking it now.  Colonoscopy and endoscopy was performed by Dr. Tobi Bastos on 01/14/2022.  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 Two polyps removed from ascending colon showing tubular adenomas.   INTERVAL HISTORY-  Patient was seen today as follow-up as follow-up for iron deficiency anemia. Patient continues to report pain in his left feet.  He also reports trigger fingers in both his hands.  He has been following with [facility].  Tells me that he was started on p.o.'s but is not helping with pain.  He has follow-up visit with them on May 13.  Otherwise denies any blood in urine or stools.   REVIEW OF SYSTEMS:   Review of Systems  All other systems reviewed and are  negative.   As per HPI. Otherwise, a complete review of systems is negative.  PAST MEDICAL HISTORY: Past Medical History:  Diagnosis Date   Diabetes mellitus without complication    Hypercholesteremia    Hypertension    Sleep apnea     PAST SURGICAL HISTORY: Past Surgical History:  Procedure Laterality Date   COLONOSCOPY WITH PROPOFOL N/A 01/04/2022   Procedure: COLONOSCOPY WITH PROPOFOL;  Surgeon: Wyline Mood, MD;  Location: [facility] ENDOSCOPY;  Service: Gastroenterology;  Laterality: N/A;   ESOPHAGOGASTRODUODENOSCOPY N/A 01/04/2022   Procedure: ESOPHAGOGASTRODUODENOSCOPY (EGD);  Surgeon: Wyline Mood, MD;  Location: [facility] ENDOSCOPY;  Service: Gastroenterology;  Laterality: N/A;   HERNIA REPAIR      FAMILY HISTORY: Family History  Problem Relation Age of Onset   Prostate cancer Neg Hx    Bladder Cancer Neg Hx    Kidney cancer Neg Hx     HEALTH MAINTENANCE: Social History   Tobacco Use   Smoking status: Former    Packs/day: 1.00    Years: 25.00    Additional pack years: 0.00    Total pack years: 25.00    Types: Cigarettes    Start date: 03/01/1967    Quit date: 02/29/1992    Years since quitting: 30.3    Passive exposure: Past   Smokeless tobacco: Never  Vaping Use   Vaping Use: Never used  Substance Use Topics   Alcohol use: Yes    Comment: infreqently   Drug use: No     No Known Allergies  Current Outpatient Medications  Medication Sig Dispense Refill   ACCU-CHEK AVIVA PLUS test strip      albuterol (VENTOLIN HFA) 108 (90 Base) MCG/ACT inhaler Inhale into the lungs.     allopurinol (ZYLOPRIM) 100 MG tablet Take 1 tablet by mouth daily.     aspirin 81 MG tablet Take 81 mg by mouth daily.     atorvastatin (LIPITOR) 40 MG tablet Take 40 mg by mouth daily.     BD INSULIN SYRINGE U/F 31G X 5/16" 0.5 ML MISC 4 (four) times daily.     cyanocobalamin (VITAMIN B12) 1000 MCG tablet Take 1,000 mcg by mouth daily.     empagliflozin (JARDIANCE) 25 MG TABS tablet Take 25 mg  by mouth daily.     ferrous sulfate 325 (65 FE) MG EC tablet Take 325 mg by mouth 3 (three) times daily with meals.     finasteride (PROSCAR) 5 MG tablet Take 1 tablet (5 mg total) by mouth daily. 90 tablet 3   fluticasone (FLONASE) 50 MCG/ACT nasal spray Place 2 sprays into both nostrils daily.     gabapentin (NEURONTIN) 300 MG capsule Take 1 capsule (300 mg total) by mouth every 8 (eight) hours. 90 capsule 1   insulin glargine (LANTUS) 100 UNIT/ML injection Inject 50 Units into the skin 2 (two) times daily.     insulin lispro (HUMALOG) 100 UNIT/ML injection Inject 30 Units into the skin 3 (three) times daily with meals.     lisinopril (PRINIVIL,ZESTRIL) 10 MG tablet Take 10 mg by mouth daily.     tamsulosin (FLOMAX) 0.4 MG CAPS capsule TAKE 1 CAPSULE BY MOUTH EVERY DAY 30 MINUTES AFTER THE SAME MEAL     traMADol (ULTRAM) 50 MG tablet Take 1 tablet (50 mg total) by mouth every 6 (six) hours as needed. 20 tablet 0   No current facility-administered medications for this visit.    OBJECTIVE: Vitals:   06/13/22 1000  BP: (!) 147/59  Pulse: 83  Resp: 20  Temp: (!) 97.2 F (36.2 C)     Body mass index is 39.02 kg/m.      General: Well-developed, well-nourished, no acute distress. Eyes: Pink conjunctiva, anicteric sclera. HEENT: Normocephalic, moist mucous membranes, clear oropharnyx. Lungs: Clear to auscultation bilaterally. Heart: Regular rate and rhythm. No rubs, murmurs, or gallops. Abdomen: Soft, nontender, nondistended. No organomegaly noted, normoactive bowel sounds. Musculoskeletal: No edema, cyanosis, or clubbing. Neuro: Alert, answering all questions appropriately. Cranial nerves grossly intact. Skin: No rashes or petechiae noted. Psych: Normal affect. Lymphatics: No cervical, calvicular, axillary or inguinal LAD.   LAB RESULTS:  Lab Results  Component Value Date   NA 137 03/23/2022   K 3.6 03/23/2022   CL 103 03/23/2022   CO2 23 03/23/2022   GLUCOSE 146 (H)  03/23/2022   BUN 8 03/23/2022   CREATININE 0.74 03/23/2022   CALCIUM 9.2 03/23/2022   PROT 7.5 03/23/2022   ALBUMIN 3.9 03/23/2022   AST 19 03/23/2022   ALT 20 03/23/2022   ALKPHOS 77 03/23/2022   BILITOT 0.6 03/23/2022   GFRNONAA >60 03/23/2022   GFRAA >60 03/09/2018    Lab Results  Component Value Date   WBC 6.0 06/13/2022   NEUTROABS 2.9 06/13/2022   HGB 12.5 (L) 06/13/2022   HCT 37.4 (L) 06/13/2022   MCV 84.8 06/13/2022   PLT 339 06/13/2022    Lab Results  Component Value Date   TIBC 330 02/09/2022   TIBC 326 11/09/2021   FERRITIN 35 02/09/2022  FERRITIN 12 (L) 11/09/2021   IRONPCTSAT 17 (L) 02/09/2022   IRONPCTSAT 8 (LL) 11/09/2021     STUDIES: No results found.  ASSESSMENT AND PLAN:   ERION WEIGHTMAN is a 72 y.o. male with pmh of diabetes, hypertension, hyperlipidemia, iron deficiency anemia and H. pylori positive was referred to hematology clinic for further management of iron deficiency anemia.  # Iron deficiency anemia  - Colonoscopy and endoscopy was performed by Dr. Tobi Bastos on 01/14/2022.  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 Two polyps removed from ascending colon showing tubular adenomas.  - s/p 2 doses of IV Feraheme.  Responded very well.  Repeat hemoglobin and iron studies normal.  Continue with oral iron 2 times a day.  Tolerating well. -Labs reviewed from today.  CBC showed hemoglobin of 12.5.  Previously was 13.5.  Iron panel is pending.  Will call the patient if iron levels are low and if he needs IV iron.  # B12 deficiency -B12 level from 11/09/2021 216 -Celiac panel was negative -Continue with B12 1000 mcg daily.  B12 improved to around 600.  # H pylori positive -Triple drug therapy on 11/17/2021 by GI.  # Left ankle pain -Follows with [facility].  No improvement with brace and Voltaren gel.  Cannot tolerate Tylenol.  Cannot take  NSAIDs due to GI bleed. -Continue with [facility]  Orders Placed This Encounter  Procedures   CBC with Differential/Platelet   Iron and TIBC   Ferritin   Vitamin B12    RTC in 6 months for MD visit, labs and possible Feraheme  Patient expressed understanding and was in agreement with this plan. He also understands that He can call clinic at any time with any questions, concerns, or complaints.   I spent a total of 30 minutes reviewing chart data, face-to-face evaluation with the patient, counseling and coordination of care as detailed above.  Michaelyn Barter, MD   06/13/2022 1:04 PM

## 2022-06-14 ENCOUNTER — Telehealth: Payer: Self-pay | Admitting: Internal Medicine

## 2022-06-14 NOTE — Telephone Encounter (Signed)
Called pt to notify of scheduled Iron appt. Pt confirmed

## 2022-06-14 NOTE — Telephone Encounter (Signed)
-----   Message from Michaelyn Barter, MD sent at 06/14/2022  8:49 AM EDT ----- Regarding: schedule IV ferraheme Please schedule IV ferraheme x 1.   Thank you Dr. Mervyn Skeeters ----- Message ----- From: Leory Plowman, Lab In [place] Sent: 06/13/2022  10:52 AM EDT To: Michaelyn Barter, MD

## 2022-06-16 NOTE — Progress Notes (Signed)
06/17/22 10:03 AM   Randy [name] Nov 25, 1950 [id number]  Referring provider:  [facility], [facility] [address] [place],  [place] [address]  Urological history  1. BPH w/ LUTS - PSA (10/2021) 1.4  -TRUS (11/2021) - 83 cc - cysto (11/2021) - Enlarged prostate bilobar coaptation - slight intravesical protrusion of kissing lateral lobes, no median lobe  - tamsulosin 0.4 mg and finasteride 5 mg daily  2. Nocturia -Risk factors for nocturia: obstructive sleep apnea, hypertension, diabetes and BPH -untreated sleep apnea   HPI: Randy [name] is a 72 y.o.male who presents today for three month follow up.   I PSS 7/2  PVR 154 mL   He states that since he started the finasteride he has decreased to nocturia x 3-4.  He still has not received a new CPAP machine.  He states that his brand has been discontinued.  He has an upcoming appointment with his doctor to discuss a different CPAP machine.  Patient denies any modifying or aggravating factors.  Patient denies any gross hematuria, dysuria or suprapubic/flank pain.  Patient denies any fevers, chills, nausea or vomiting.      IPSS     Row Name 06/17/22 0900         International Prostate Symptom Score   How often have you had the sensation of not emptying your bladder? Less than half the time     How often have you had to urinate less than every two hours? Less than half the time     How often have you found you stopped and started again several times when you urinated? Not at All     How often have you found it difficult to postpone urination? Not at All     How often have you had a weak urinary stream? Not at All     How often have you had to strain to start urination? Not at All     How many times did you typically get up at night to urinate? 3 Times     Total IPSS Score 7       Quality of Life due to urinary symptoms   If you were to spend the rest of your life with your urinary condition  just the way it is now how would you feel about that? Mostly Satisfied               Score:  1-7 Mild 8-19 Moderate 20-35 Severe      PMH: Past Medical History:  Diagnosis Date   Diabetes mellitus without complication    Hypercholesteremia    Hypertension    Sleep apnea     Surgical History: Past Surgical History:  Procedure Laterality Date   COLONOSCOPY WITH PROPOFOL N/A 01/04/2022   Procedure: COLONOSCOPY WITH PROPOFOL;  Surgeon: Wyline Mood, MD;  Location: [facility];  Service: Gastroenterology;  Laterality: N/A;   ESOPHAGOGASTRODUODENOSCOPY N/A 01/04/2022   Procedure: ESOPHAGOGASTRODUODENOSCOPY (EGD);  Surgeon: Wyline Mood, MD;  Location: [facility] ENDOSCOPY;  Service: Gastroenterology;  Laterality: N/A;   HERNIA REPAIR      Home Medications:  Allergies as of 06/17/2022   No Known Allergies      Medication List        Accurate as of June 17, 2022 10:03 AM. If you have any questions, ask your nurse or doctor.          Accu-Chek Aviva Plus test strip Generic drug: glucose blood   albuterol 108 (90  Base) MCG/ACT inhaler Commonly known as: VENTOLIN HFA Inhale into the lungs.   allopurinol 100 MG tablet Commonly known as: ZYLOPRIM Take 1 tablet by mouth daily.   aspirin 81 MG tablet Take 81 mg by mouth daily.   atorvastatin 40 MG tablet Commonly known as: LIPITOR Take 40 mg by mouth daily.   BD Insulin Syringe U/F 31G X 5/16" 0.5 ML Misc Generic drug: Insulin Syringe-Needle U-100 4 (four) times daily.   cyanocobalamin 1000 MCG tablet Commonly known as: VITAMIN B12 Take 1,000 mcg by mouth daily.   ferrous sulfate 325 (65 FE) MG EC tablet Take 325 mg by mouth 3 (three) times daily with meals.   finasteride 5 MG tablet Commonly known as: PROSCAR Take 1 tablet (5 mg total) by mouth daily.   fluticasone 50 MCG/ACT nasal spray Commonly known as: FLONASE Place 2 sprays into both nostrils daily.   gabapentin 300 MG capsule Commonly known  as: Neurontin Take 1 capsule (300 mg total) by mouth every 8 (eight) hours.   insulin glargine 100 UNIT/ML injection Commonly known as: LANTUS Inject 50 Units into the skin 2 (two) times daily.   insulin lispro 100 UNIT/ML injection Commonly known as: HUMALOG Inject 30 Units into the skin 3 (three) times daily with meals.   Jardiance 10 MG Tabs tablet Generic drug: empagliflozin Take 10 mg by mouth daily. What changed: Another medication with the same name was removed. Continue taking this medication, and follow the directions you see here. Changed by: Mariabelen Pressly, PA-C   lisinopril 10 MG tablet Commonly known as: ZESTRIL Take 10 mg by mouth daily.   tamsulosin 0.4 MG Caps capsule Commonly known as: FLOMAX TAKE 1 CAPSULE BY MOUTH EVERY DAY 30 MINUTES AFTER THE SAME MEAL   traMADol 50 MG tablet Commonly known as: ULTRAM Take 1 tablet (50 mg total) by mouth every 6 (six) hours as needed.        Allergies:  No Known Allergies  Family History: Family History  Problem Relation Age of Onset   Prostate cancer Neg Hx    Bladder Cancer Neg Hx    Kidney cancer Neg Hx     Social History:  reports that he quit smoking about 30 years ago. His smoking use included cigarettes. He started smoking about 55 years ago. He has a 25.00 pack-year smoking history. He has been exposed to tobacco smoke. He has never used smokeless tobacco. He reports current alcohol use. He reports that he does not use drugs.   Physical Exam: There were no vitals taken for this visit.  Constitutional:  Well nourished. Alert and oriented, No acute distress. HEENT: [place] AT, moist mucus membranes.  Trachea midline Cardiovascular: No clubbing, cyanosis, or edema. Respiratory: Normal respiratory effort, no increased work of breathing. Neurologic: Grossly intact, no focal deficits, moving all 4 extremities. Psychiatric: Normal mood and affect.   Laboratory Data: N/A    Pertinent Imaging:  06/17/22  09:52  Scan Result   Assessment & Plan:    1. BPH with LUTS -Currently at goal on tamsulosin and finasteride  2. Nocturia -He is still having issues obtaining a CPAP machine   Return in 6 months (on 12/17/2022) for 6 months for ipss and pvr and psa.  Cloretta Ned   [facility] [address], [address] [place], [place] [address] [phone]

## 2022-06-17 ENCOUNTER — Ambulatory Visit (INDEPENDENT_AMBULATORY_CARE_PROVIDER_SITE_OTHER): Payer: 59 | Admitting: Urology

## 2022-06-17 ENCOUNTER — Encounter: Payer: Self-pay | Admitting: Urology

## 2022-06-17 DIAGNOSIS — N401 Enlarged prostate with lower urinary tract symptoms: Secondary | ICD-10-CM

## 2022-06-17 DIAGNOSIS — N138 Other obstructive and reflux uropathy: Secondary | ICD-10-CM

## 2022-06-17 DIAGNOSIS — R351 Nocturia: Secondary | ICD-10-CM | POA: Diagnosis not present

## 2022-06-17 LAB — BLADDER SCAN AMB NON-IMAGING

## 2022-06-20 ENCOUNTER — Inpatient Hospital Stay: Payer: 59

## 2022-06-24 ENCOUNTER — Encounter: Payer: Self-pay | Admitting: Internal Medicine

## 2022-06-24 NOTE — Addendum Note (Signed)
Addended byMichaelyn Barter on: 06/24/2022 08:57 AM   Modules accepted: Orders

## 2022-06-27 ENCOUNTER — Inpatient Hospital Stay: Payer: 59

## 2022-06-27 VITALS — BP 128/62 | HR 81 | Temp 98.7°F | Resp 18

## 2022-06-27 DIAGNOSIS — D509 Iron deficiency anemia, unspecified: Secondary | ICD-10-CM | POA: Diagnosis not present

## 2022-06-27 MED ORDER — SODIUM CHLORIDE 0.9 % IV SOLN
510.0000 mg | Freq: Once | INTRAVENOUS | Status: AC
  Administered 2022-06-27: 510 mg via INTRAVENOUS
  Filled 2022-06-27: qty 17

## 2022-06-27 MED ORDER — SODIUM CHLORIDE 0.9 % IV SOLN
INTRAVENOUS | Status: DC
  Filled 2022-06-27: qty 250

## 2022-06-27 NOTE — Patient Instructions (Signed)
[facility]  Discharge Instructions: Thank you for choosing [facility] to provide your oncology and hematology care.  If you have a lab appointment with the Cancer Center, please go directly to the Cancer Center and check in at the registration area.  Wear comfortable clothing and clothing appropriate for easy access to any Portacath or PICC line.   We strive to give you quality time with your provider. You may need to reschedule your appointment if you arrive late (15 or more minutes).  Arriving late affects you and other patients whose appointments are after yours.  Also, if you miss three or more appointments without notifying the office, you may be dismissed from the clinic at the provider's discretion.      For prescription refill requests, have your pharmacy contact our office and allow 72 hours for refills to be completed.    Today you received the following chemotherapy and/or immunotherapy agents Feraheme.      To help prevent nausea and vomiting after your treatment, we encourage you to take your nausea medication as directed.  BELOW ARE SYMPTOMS THAT SHOULD BE REPORTED IMMEDIATELY: *FEVER GREATER THAN 100.4 F (38 C) OR HIGHER *CHILLS OR SWEATING *NAUSEA AND VOMITING THAT IS NOT CONTROLLED WITH YOUR NAUSEA MEDICATION *UNUSUAL SHORTNESS OF BREATH *UNUSUAL BRUISING OR BLEEDING *URINARY PROBLEMS (pain or burning when urinating, or frequent urination) *BOWEL PROBLEMS (unusual diarrhea, constipation, pain near the anus) TENDERNESS IN MOUTH AND THROAT WITH OR WITHOUT PRESENCE OF ULCERS (sore throat, sores in mouth, or a toothache) UNUSUAL RASH, SWELLING OR PAIN  UNUSUAL VAGINAL DISCHARGE OR ITCHING   Items with * indicate a potential emergency and should be followed up as soon as possible or go to the Emergency Department if any problems should occur.  Please show the CHEMOTHERAPY ALERT CARD or IMMUNOTHERAPY ALERT CARD at check-in to  the Emergency Department and triage nurse.  Should you have questions after your visit or need to cancel or reschedule your appointment, please contact [facility]  [phone] and follow the prompts.  Office hours are 8:00 a.m. to 4:30 p.m. Monday - Friday. Please note that voicemails left after 4:00 p.m. may not be returned until the following business day.  We are closed weekends and major holidays. You have access to a nurse at all times for urgent questions. Please call the main number to the clinic [phone] and follow the prompts.  For any non-urgent questions, you may also contact your provider using MyChart. We now offer e-Visits for anyone 18 and older to request care online for non-urgent symptoms. For details visit [email or web address].   Also download the MyChart app! Go to the app store, search "MyChart", open the app, select , and log in with your MyChart username and password.    

## 2022-06-27 NOTE — Progress Notes (Signed)
Patient declined to wait the 30 minutes for post iron infusion observation today.  Post iron education done. Patient verbalized understanding. 

## 2022-07-11 ENCOUNTER — Encounter: Payer: Self-pay | Admitting: Student in an Organized Health Care Education/Training Program

## 2022-07-11 ENCOUNTER — Ambulatory Visit
Payer: 59 | Attending: Student in an Organized Health Care Education/Training Program | Admitting: Student in an Organized Health Care Education/Training Program

## 2022-07-11 VITALS — BP 126/67 | HR 86 | Temp 98.1°F | Resp 17 | Ht 68.0 in | Wt 261.0 lb

## 2022-07-11 DIAGNOSIS — M792 Neuralgia and neuritis, unspecified: Secondary | ICD-10-CM | POA: Insufficient documentation

## 2022-07-11 DIAGNOSIS — Z794 Long term (current) use of insulin: Secondary | ICD-10-CM

## 2022-07-11 DIAGNOSIS — E1140 Type 2 diabetes mellitus with diabetic neuropathy, unspecified: Secondary | ICD-10-CM | POA: Diagnosis present

## 2022-07-11 DIAGNOSIS — G5793 Unspecified mononeuropathy of bilateral lower limbs: Secondary | ICD-10-CM | POA: Diagnosis present

## 2022-07-11 MED ORDER — GABAPENTIN 400 MG PO CAPS
400.0000 mg | ORAL_CAPSULE | Freq: Three times a day (TID) | ORAL | 2 refills | Status: AC
Start: 2022-07-11 — End: ?

## 2022-07-11 NOTE — Progress Notes (Signed)
PROVIDER NOTE: Information contained herein reflects review and annotations entered in association with encounter. Interpretation of such information and data should be left to medically-trained personnel. Information provided to patient can be located elsewhere in the medical record under "Patient Instructions". Document created using STT-dictation technology, any transcriptional errors that may result from process are unintentional.    Patient: Randy [name]  Service Category: E/M  Provider: Edward Jolly, MD  DOB: [birth year]  DOS: 07/11/2022  Referring Provider: [facility], [facility]*  MRN: [id number]  Specialty: Interventional Pain Management  PCP: [facility]  Type: Established Patient  Setting: Ambulatory outpatient    Location: Office  Delivery: Face-to-face     HPI  Mr. Randy [name], a 72 y.o. 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 Mr. Minor's primary complain today is Foot Pain (left)   Pain Assessment: Severity of Chronic pain is reported as a 0-No pain (0 pain score refers to discomfort of foot prior to Qutenza procedure. Pt states pain of great toe is 8)/10. Location: Foot Left/denies; pt c/o right great toe pain since he caught toe in a towel at home prior to qutenza procedure. Pt. states that pain has not changed since procedure.. Onset:  . Quality:  . Timing:  . Modifying factor(s):  Marland Kitchen Vitals:  height is 5\' 8"  (1.727 m) and weight is 261 lb (118.4 kg). His temporal temperature is 98.1 F (36.7 C). His blood pressure is 126/67 and his pulse is 86. His respiration is 17 and oxygen saturation is 98%.  BMI: Estimated body mass index is 39.68 kg/m as calculated from the following:   Height as of this encounter: 5\' 8"  (1.727 m).   Weight as of this encounter: 261 lb (118.4 kg). Last encounter: 05/24/2022. Last procedure: 05/30/2022.  Reason for encounter: both, medication management and post-procedure  evaluation and assessment.   -Good response to Qutenza, see below -Also obtaining benefit with Gabapentin, increase dose from 300 mg TID--> 400 mg TID   Post-procedure evaluation   Type: Qutenza Neurolysis #1  Laterality:  Left Area treated: Feet Imaging Guidance: None Anesthesia/analgesia/anxiolysis/sedation: None required Medication (Right): Qutenza (capsaicin 8%) topical system Medication (Left): Qutenza (capsaicin 8%) topical system Date: 05/30/2022 Performed by: Edward Jolly, MD Rationale (medical necessity): procedure needed and proper for the treatment of Mr. Mcsorley's medical symptoms and needs. 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 1. Chronic painful diabetic neuropathy   2. Neuropathic pain of both feet   3. Neuropathic pain    NAS-11 Pain score:   Pre-procedure: 10-Worst pain ever/10   Post-procedure: 10-Worst pain ever/10      Effectiveness:   Analgesia past initial 6 hours: 100 % (left foot numbess overall 100% better; pt states great toe and next toe still feel numb/sore with pain radiating on top of left foot; pt states those 2 toes had been caught in  towel b4 proc and these 2 toes and radiating pain have felt like this since)  Ongoing improvement:  Analgesic:  90% Function: Somewhat improved ROM: Somewhat improved    ROS  Constitutional: Denies any fever or chills Gastrointestinal: No reported hemesis, hematochezia, vomiting, or acute GI distress Musculoskeletal: Denies any acute onset joint swelling, redness, loss of ROM, or weakness Neurological:  left foot parasthesias  Medication Review  Insulin Syringe-Needle U-100, albuterol, allopurinol, aspirin, atorvastatin, cyanocobalamin, empagliflozin, finasteride, fluticasone, gabapentin, glucose blood, insulin glargine, insulin  lispro, lisinopril, tamsulosin, and traMADol  History Review  Allergy: Mr. Reynen has No  Known Allergies. Drug: Mr. Kerstein  reports no history of drug use. Alcohol:  reports current alcohol use. Tobacco:  reports that he quit smoking about 30 years ago. His smoking use included cigarettes. He started smoking about 55 years ago. He has a 25.00 pack-year smoking history. He has been exposed to tobacco smoke. He has never used smokeless tobacco. Social: Mr. Remund  reports that he quit smoking about 30 years ago. His smoking use included cigarettes. He started smoking about 55 years ago. He has a 25.00 pack-year smoking history. He has been exposed to tobacco smoke. He has never used smokeless tobacco. He reports current alcohol use. He reports that he does not use drugs. Medical:  has a past medical history of Diabetes mellitus without complication (HCC), Hypercholesteremia, Hypertension, and Sleep apnea. Surgical: Mr. Willimas  has a past surgical history that includes Hernia repair; Esophagogastroduodenoscopy (N/A, 01/04/2022); and Colonoscopy with propofol (N/A, 01/04/2022). Family: family history is not on file.  Laboratory Chemistry Profile   Renal Lab Results  Component Value Date   BUN 8 03/23/2022   CREATININE 0.74 03/23/2022   GFRAA >60 03/09/2018   GFRNONAA >60 03/23/2022    Hepatic Lab Results  Component Value Date   AST 19 03/23/2022   ALT 20 03/23/2022   ALBUMIN 3.9 03/23/2022   ALKPHOS 77 03/23/2022   LIPASE 163 (H) 11/11/2020    Electrolytes Lab Results  Component Value Date   NA 137 03/23/2022   K 3.6 03/23/2022   CL 103 03/23/2022   CALCIUM 9.2 03/23/2022    Bone No results found for: "VD25OH", "VD125OH2TOT", "ZO1096EA5", "WU9811BJ4", "25OHVITD1", "25OHVITD2", "25OHVITD3", "TESTOFREE", "TESTOSTERONE"  Inflammation (CRP: Acute Phase) (ESR: Chronic Phase) No results found for: "CRP", "ESRSEDRATE", "LATICACIDVEN"       Note: Above Lab results reviewed.  Recent Imaging Review  VAS [place] LOWER EXTREMITY VENOUS (DVT)  Lower Venous DVT Study  Patient Name:   Randy [name]  Date of Exam:   04/14/2022 Medical Rec #: [id number]         Accession #:    [id number] Date of Birth: [birth year]         Patient Gender: M Patient Age:   72 years Exam [facility] Procedure:      VAS [place] LOWER EXTREMITY VENOUS (DVT) Referring Phys: Levora Dredge  --------------------------------------------------------------------------------   Indications: Pain, and Swelling.   Performing Technologist: Debbe Bales RVS    Examination Guidelines: A complete evaluation includes B-mode imaging, spectral Doppler, color Doppler, and power Doppler as needed of all accessible portions of each vessel. Bilateral testing is considered an integral part of a complete examination. Limited examinations for reoccurring indications may be performed as noted. The reflux portion of the exam is performed with the patient in reverse Trendelenburg.     +---------+---------------+---------+-----------+----------+--------------+ LEFT     CompressibilityPhasicitySpontaneityPropertiesThrombus Aging +---------+---------------+---------+-----------+----------+--------------+ CFV      Full           Yes      Yes                                 +---------+---------------+---------+-----------+----------+--------------+ SFJ      Full           Yes      Yes                                 +---------+---------------+---------+-----------+----------+--------------+  FV Prox  Full           Yes      Yes                                 +---------+---------------+---------+-----------+----------+--------------+ FV Mid   Full           Yes      Yes                                 +---------+---------------+---------+-----------+----------+--------------+ FV DistalFull           Yes      Yes                                 +---------+---------------+---------+-----------+----------+--------------+ PFV      Full           Yes       Yes                                 +---------+---------------+---------+-----------+----------+--------------+ POP      Full           Yes      Yes                                 +---------+---------------+---------+-----------+----------+--------------+ PTV      Full           Yes      Yes                                 +---------+---------------+---------+-----------+----------+--------------+ PERO     Full           Yes      Yes                                 +---------+---------------+---------+-----------+----------+--------------+ GSV      Full           Yes      Yes                                 +---------+---------------+---------+-----------+----------+--------------+ SSV      Full           Yes      Yes                                 +---------+---------------+---------+-----------+----------+--------------+             Summary:  LEFT: - No evidence of deep vein thrombosis in the lower extremity. No indirect evidence of obstruction proximal to the inguinal ligament. - There is no evidence of deep vein thrombosis in the lower extremity. - There is no evidence of deep vein thrombosis in the lower extremity. However, portions of this examination were limited- see technologist comments above. - There is no evidence of deep vein thrombosis proximal to the inguinal ligament or in the common femoral vein.    *See  table(s) above for measurements and observations.  Electronically signed by Levora Dredge MD on 04/20/2022 at 3:40:32 PM.      Final   Note: Reviewed        Physical Exam  General appearance: Well nourished, well developed, and well hydrated. In no apparent acute distress Mental status: Alert, oriented x 3 (person, place, & time)       Respiratory: No evidence of acute respiratory distress Eyes: PERLA Vitals: BP 126/67   Pulse 86   Temp 98.1 F (36.7 C) (Temporal)   Resp 17   Ht 5\' 8"  (1.727 m)   Wt 261 lb  (118.4 kg)   SpO2 98%   BMI 39.68 kg/m  BMI: Estimated body mass index is 39.68 kg/m as calculated from the following:   Height as of this encounter: 5\' 8"  (1.727 m).   Weight as of this encounter: 261 lb (118.4 kg). Ideal: Ideal body weight: 68.4 kg (150 lb 12.7 oz) Adjusted ideal body weight: 88.4 kg (194 lb 14 oz)  Left foot paraesthesias  Assessment   Diagnosis Status  1. Chronic painful diabetic neuropathy (HCC)   2. Neuropathic pain of both feet   3. Neuropathic pain    Controlled Controlled Controlled   Updated Problems: No problems updated.  Plan of Care  -Recommend OTC Lidocaine cream and Voltaren gel to left foot, primarily big toe -Follow up in 1.5 months (after July 1st) for Qutenza #2. -Recommend increase Gabapentin from 300 mg TID--> 400 mg TID. States that Gabapentin is helping, not noticing any side effects  1. Chronic painful diabetic neuropathy (HCC) - gabapentin (NEURONTIN) 400 MG capsule; Take 1 capsule (400 mg total) by mouth every 8 (eight) hours.  Dispense: 90 capsule; Refill: 2 - NEUROLYSIS; Future  2. Neuropathic pain of both feet - gabapentin (NEURONTIN) 400 MG capsule; Take 1 capsule (400 mg total) by mouth every 8 (eight) hours.  Dispense: 90 capsule; Refill: 2 - NEUROLYSIS; Future  3. Neuropathic pain - gabapentin (NEURONTIN) 400 MG capsule; Take 1 capsule (400 mg total) by mouth every 8 (eight) hours.  Dispense: 90 capsule; Refill: 2 - NEUROLYSIS; Future    Follow-up plan:   Return in about 7 weeks (around 08/31/2022) for Qutenza #2.      Recent Visits Date Type Provider Dept  05/30/22 Procedure visit Edward Jolly, MD [facility]  05/24/22 Office Visit Edward Jolly, MD [facility]  Showing recent visits within past 90 days and meeting all other requirements Today's Visits Date Type Provider Dept  07/11/22 Office Visit Edward Jolly, MD [facility]  Showing today's visits and meeting all other  requirements Future Appointments No visits were found meeting these conditions. Showing future appointments within next 90 days and meeting all other requirements  I discussed the assessment and treatment plan with the patient. The patient was provided an opportunity to ask questions and all were answered. The patient agreed with the plan and demonstrated an understanding of the instructions.  Patient advised to call back or seek an in-person evaluation if the symptoms or condition worsens.  Duration of encounter: .  Total time on encounter, as per AMA guidelines included both the face-to-face and non-face-to-face time personally spent by the physician and/or other qualified health care professional(s) on the day of the encounter (includes time in activities that require the physician or other qualified health care professional and does not include time in activities normally performed by clinical staff). Physician's time may include the following activities  when performed: Preparing to see the patient (e.g., pre-charting review of records, searching for previously ordered imaging, lab work, and nerve conduction tests) Review of prior analgesic pharmacotherapies. Reviewing PMP Interpreting ordered tests (e.g., lab work, imaging, nerve conduction tests) Performing post-procedure evaluations, including interpretation of diagnostic procedures Obtaining and/or reviewing separately obtained history Performing a medically appropriate examination and/or evaluation Counseling and educating the patient/family/caregiver Ordering medications, tests, or procedures Referring and communicating with other health care professionals (when not separately reported) Documenting clinical information in the electronic or other health record Independently interpreting results (not separately reported) and communicating results to the patient/ family/caregiver Care coordination (not separately  reported)  Note by: Edward Jolly, MD Date: 07/11/2022; Time: 1:59 PM

## 2022-07-11 NOTE — Progress Notes (Signed)
Safety precautions to be maintained throughout the outpatient stay will include: orient to surroundings, keep bed in low position, maintain call bell within reach at all times, provide assistance with transfer out of bed and ambulation.  

## 2022-07-12 ENCOUNTER — Ambulatory Visit: Payer: 59 | Admitting: Student in an Organized Health Care Education/Training Program

## 2022-07-15 ENCOUNTER — Telehealth: Payer: Self-pay | Admitting: Student in an Organized Health Care Education/Training Program

## 2022-07-15 NOTE — Telephone Encounter (Signed)
PT called stated that the Gabapentin that Dr. Cherylann Ratel prescribed for him to take. Is making him drowsy and swelling. PT stated that he can't take this medication. Please give patient a call. TY

## 2022-07-15 NOTE — Telephone Encounter (Signed)
Spoke with patient and he states both of his legs are swelling and he is feeling very drowsy.  States he does not want to continue taking the Gabapentin.  States he discussed the swelling with Dr Cherylann Ratel at his last visit but that his dose was increased.  States he was having these same symptoms with the lower dose.  Will notify Dr Cherylann Ratel.

## 2022-07-18 ENCOUNTER — Telehealth: Payer: Self-pay

## 2022-07-18 NOTE — Telephone Encounter (Signed)
Patient notified

## 2022-08-29 ENCOUNTER — Encounter: Payer: Self-pay | Admitting: Student in an Organized Health Care Education/Training Program

## 2022-08-29 ENCOUNTER — Ambulatory Visit
Payer: 59 | Attending: Student in an Organized Health Care Education/Training Program | Admitting: Student in an Organized Health Care Education/Training Program

## 2022-08-29 VITALS — BP 153/67 | HR 68 | Temp 97.6°F | Resp 16 | Ht 68.0 in | Wt 261.0 lb

## 2022-08-29 DIAGNOSIS — E1140 Type 2 diabetes mellitus with diabetic neuropathy, unspecified: Secondary | ICD-10-CM | POA: Insufficient documentation

## 2022-08-29 MED ORDER — CAPSAICIN-CLEANSING GEL 8 % EX KIT
2.0000 | PACK | Freq: Once | CUTANEOUS | Status: AC
  Administered 2022-08-29: 2 via TOPICAL
  Filled 2022-08-29: qty 2

## 2022-08-29 NOTE — Patient Instructions (Signed)

## 2022-08-29 NOTE — Progress Notes (Signed)
PROVIDER NOTE: Interpretation of information contained herein should be left to medically-trained personnel. Specific patient instructions are provided elsewhere under "Patient Instructions" section of medical record. This document was created in part using STT-dictation technology, any transcriptional errors that may result from this process are unintentional.  Patient: Randy [name] Type: Established DOB: [birth year] MRN: [id number] PCP: [facility]  Service: Procedure DOS: 08/29/2022 Setting: Ambulatory Location: Ambulatory outpatient facility Delivery: Face-to-face Provider: Edward Jolly, MD Specialty: Interventional Pain Management Specialty designation: 09 Location: Outpatient facility Ref. Prov.: [facility]       Interventional Therapy   Interventional Treatment:           Type: Qutenza Neurolysis #2  Laterality:  Left Area treated: Feet Imaging Guidance: None Anesthesia/analgesia/anxiolysis/sedation: None required Medication (Right): Qutenza (capsaicin 8%) topical system Medication (Left): Qutenza (capsaicin 8%) topical system Date: 08/29/2022 Performed by: Edward Jolly, MD Rationale (medical necessity): procedure needed and proper for the treatment of Randy [name]'s medical symptoms and needs. 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 1. Chronic painful diabetic neuropathy (HCC)     NAS-11 Pain score:   Pre-procedure: 9 /10   Post-procedure: 9 /10     Position / Prep / Materials:  Position: Supine  Materials: Qutenza Kit  Pre-op H&P Assessment:  Randy [name] is a 72 y.o. (year old), male patient, seen today for interventional treatment. He  has a past surgical history that includes Hernia repair; Esophagogastroduodenoscopy (N/A, 01/04/2022); and Colonoscopy with propofol (N/A, 01/04/2022). Randy [name] has a current medication list which includes the  following prescription(s): accu-chek aviva plus, albuterol, allopurinol, aspirin, atorvastatin, bd insulin syringe u/f, cyanocobalamin, finasteride, fluticasone, gabapentin, insulin glargine, insulin lispro, jardiance, lisinopril, tamsulosin, and tramadol. His primarily concern today is the Foot Pain (left)  Initial Vital Signs:  Pulse/HCG Rate: 68  Temp: 97.6 F (36.4 C) Resp: 16 BP: (!) 153/67 SpO2: 98 %  BMI: Estimated body mass index is 39.68 kg/m as calculated from the following:   Height as of this encounter: 5\' 8"  (1.727 m).   Weight as of this encounter: 261 lb (118.4 kg).  Risk Assessment: Allergies: Reviewed. He has No Known Allergies.  Allergy Precautions: None required Coagulopathies: Reviewed. None identified.  Blood-thinner therapy: None at this time Active Infection(s): Reviewed. None identified. Randy [name] is afebrile  Site Confirmation: Randy [name] was asked to confirm the procedure and laterality before marking the site Procedure checklist: Completed Consent: Before the procedure and under the influence of no sedative(s), amnesic(s), or anxiolytics, the patient was informed of the treatment options, risks and possible complications. To fulfill our ethical and legal obligations, as recommended by the American Medical Association's Code of Ethics, I have informed the patient of my clinical impression; the nature and purpose of the treatment or procedure; the risks, benefits, and possible complications of the intervention; the alternatives, including doing nothing; the risk(s) and benefit(s) of the alternative treatment(s) or procedure(s); and the risk(s) and benefit(s) of doing nothing. The patient was provided information about the general risks and possible complications associated with the procedure. These may include, but are not limited to: failure to achieve desired goals, infection, bleeding, organ or nerve damage, allergic reactions, paralysis, and death. In addition,  the patient was informed of those risks and complications associated to the procedure, such as failure to decrease pain; infection; bleeding; organ or nerve damage with subsequent damage to sensory, motor, and/or autonomic systems, resulting in permanent  pain, numbness, and/or weakness of one or several areas of the body; allergic reactions; (i.e.: anaphylactic reaction); and/or death. Furthermore, the patient was informed of those risks and complications associated with the medications. These include, but are not limited to: allergic reactions (i.e.: anaphylactic or anaphylactoid reaction(s)); adrenal axis suppression; blood sugar elevation that in diabetics may result in ketoacidosis or comma; water retention that in patients with history of congestive heart failure may result in shortness of breath, pulmonary edema, and decompensation with resultant heart failure; weight gain; swelling or edema; medication-induced neural toxicity; particulate matter embolism and blood vessel occlusion with resultant organ, and/or nervous system infarction; and/or aseptic necrosis of one or more joints. Finally, the patient was informed that Medicine is not an exact science; therefore, there is also the possibility of unforeseen or unpredictable risks and/or possible complications that may result in a catastrophic outcome. The patient indicated having understood very clearly. We have given the patient no guarantees and we have made no promises. Enough time was given to the patient to ask questions, all of which were answered to the patient's satisfaction. Randy [name] has indicated that he wanted to continue with the procedure. Attestation: I, the ordering provider, attest that I have discussed with the patient the benefits, risks, side-effects, alternatives, likelihood of achieving goals, and potential problems during recovery for the procedure that I have provided informed consent. Date  Time: 08/29/2022  9:35  AM  Pre-Procedure Preparation:  Monitoring: As per clinic protocol. Respiration, ETCO2, SpO2, BP, heart rate and rhythm monitor placed and checked for adequate function Safety Precautions: Patient was assessed for positional comfort and pressure points before starting the procedure. Time-out: I initiated and conducted the "Time-out" before starting the procedure, as per protocol. The patient was asked to participate by confirming the accuracy of the "Time Out" information. Verification of the correct person, site, and procedure were performed and confirmed by me, the nursing staff, and the patient. "Time-out" conducted as per Joint Commission's Universal Protocol (UP.01.01.01). Time: 1008 Start Time: 1008 hrs.  Description/Narrative of Procedure:          Region: Distal lower extremity Target Area: Sensory peripheral nerves affected by diabetic peripheral neuropathy Site: Feet Approach: Percutaneous  No./Series: Not applicable  Type: Percutaneous  Purpose: Therapeutic  Region: Distal lower extremities  Start Time: 1008 hrs.  Description of the Procedure: Protocol guidelines were followed. The patient was assisted into a comfortable position.  Informed consent was obtained in the patient monitored in the usual manner.  All questions were answered prior to the procedure.  They Qutenza patches were applied to the affected area and then covered with the wrap.  The Patient was kept under observation until the treatment was completed.  The patches were removed and the treated area was inspected.  Vitals:   08/29/22 0942  BP: (!) 153/67  Pulse: 68  Resp: 16  Temp: 97.6 F (36.4 C)  SpO2: 98%  Weight: 261 lb (118.4 kg)  Height: 5\' 8"  (1.727 m)     End Time: 1051 hrs.  Imaging Guidance:          Type of Imaging Technique: None used Indication(s): N/A Exposure Time: No patient exposure Contrast: None used. Fluoroscopic Guidance: N/A Ultrasound Guidance: N/A Interpretation:  N/A  Post-operative Assessment:  Post-procedure Vital Signs:  Pulse/HCG Rate: 68  Temp: 97.6 F (36.4 C) Resp: 16 BP: (!) 153/67 SpO2: 98 %  EBL: None  Complications: No immediate post-treatment complications observed by team, or reported by patient.  Note: The patient tolerated the entire procedure well. A repeat set of vitals were taken after the procedure and the patient was kept under observation following institutional policy, for this type of procedure. Post-procedural neurological assessment was performed, showing return to baseline, prior to discharge. The patient was provided with post-procedure discharge instructions, including a section on how to identify potential problems. Should any problems arise concerning this procedure, the patient was given instructions to immediately contact us, at any time, without hesitation. In any case, we plan to contact the patient by telephone for a follow-up status report regarding this interventional procedure.  Comments:  No additional relevant information.  Plan of Care (POC)  Orders:  Orders Placed This Encounter  Procedures   NEUROLYSIS    Please order Qutenza patches from pharmacy    Standing Status:   Future    Standing Expiration Date:   11/29/2022    Order Specific Question:   Where will this procedure be performed?    Answer:   [facility]     Medications ordered for procedure: Meds ordered this encounter  Medications   capsaicin topical system 8 % patch 2 patch   Medications administered: We administered capsaicin topical system.  See the medical record for exact dosing, route, and time of administration.  Follow-up plan:   Return in about 3 months (around 11/29/2022) for Qutenza.       Recent Visits Date Type Provider Dept  07/11/22 Office Visit Edward Jolly, MD [facility]  Showing recent visits within past 90 days and meeting all other requirements Today's Visits Date Type Provider Dept   08/29/22 Procedure visit Edward Jolly, MD [facility]  Showing today's visits and meeting all other requirements Future Appointments No visits were found meeting these conditions. Showing future appointments within next 90 days and meeting all other requirements  Disposition: Discharge home  Discharge (Date  Time): 08/29/2022;   hrs.   Primary Care Physician: [facility] Location: [facility] Note by: Edward Jolly, MD (TTS technology used. I apologize for any typographical errors that were not detected and corrected.) Date: 08/29/2022; Time: 10:52 AM  Disclaimer:  Medicine is not an Visual merchandiser. The only guarantee in medicine is that nothing is guaranteed. It is important to note that the decision to proceed with this intervention was based on the information collected from the patient. The Data and conclusions were drawn from the patient's questionnaire, the interview, and the physical examination. Because the information was provided in large part by the patient, it cannot be guaranteed that it has not been purposely or unconsciously manipulated. Every effort has been made to obtain as much relevant data as possible for this evaluation. It is important to note that the conclusions that lead to this procedure are derived in large part from the available data. Always take into account that the treatment will also be dependent on availability of resources and existing treatment guidelines, considered by other Pain Management Practitioners as being common knowledge and practice, at the time of the intervention. For Medico-Legal purposes, it is also important to point out that variation in procedural techniques and pharmacological choices are the acceptable norm. The indications, contraindications, technique, and results of the above procedure should only be interpreted and judged by a Board-Certified Interventional Pain Specialist with  extensive familiarity and expertise in the same exact procedure and technique.

## 2022-08-30 ENCOUNTER — Telehealth: Payer: Self-pay | Admitting: *Deleted

## 2022-08-30 NOTE — Telephone Encounter (Signed)
No problems post Qutenza application. 

## 2022-09-16 ENCOUNTER — Ambulatory Visit (INDEPENDENT_AMBULATORY_CARE_PROVIDER_SITE_OTHER): Payer: 59 | Admitting: Nurse Practitioner

## 2022-09-16 VITALS — BP 163/77 | HR 90 | Resp 19 | Ht 68.0 in | Wt 267.4 lb

## 2022-09-16 DIAGNOSIS — M79605 Pain in left leg: Secondary | ICD-10-CM | POA: Diagnosis not present

## 2022-09-16 DIAGNOSIS — I10 Essential (primary) hypertension: Secondary | ICD-10-CM

## 2022-09-16 DIAGNOSIS — Z794 Long term (current) use of insulin: Secondary | ICD-10-CM

## 2022-09-16 DIAGNOSIS — M7989 Other specified soft tissue disorders: Secondary | ICD-10-CM

## 2022-09-16 DIAGNOSIS — M79669 Pain in unspecified lower leg: Secondary | ICD-10-CM

## 2022-09-16 DIAGNOSIS — E1165 Type 2 diabetes mellitus with hyperglycemia: Secondary | ICD-10-CM

## 2022-09-17 ENCOUNTER — Encounter (INDEPENDENT_AMBULATORY_CARE_PROVIDER_SITE_OTHER): Payer: Self-pay | Admitting: Nurse Practitioner

## 2022-09-17 NOTE — Progress Notes (Signed)
Subjective:    Patient ID: Randy [name], male    DOB: 03/26/1950, 72 y.o.   MRN: [id number] Chief Complaint  Patient presents with   Venous Insufficiency    Chaston Busey is a 72 year old male who returns to [place] today regarding pain in his left leg.  The pain is slightly different than the pain he had previously but he notes that the pain is occurring in his great toe and the second toe.  They are very sensitive to slight touch.  He notes it is difficult for him to move these areas.  The pain radiates up his leg.  He also had associated swelling of his left lower extremity as well.  There is currently no open wounds or ulcerations.  The patient notes that he has had gout previously and this does not feel like a gout flare.  He is also on chronic medication for gout.  Since his last visit he has visited pain clinic and he notes that there has been some helpful changes but it has not significantly decreased the pain in his toe area.  This is making it difficult for him to walk as well as to rest as well.    Review of Systems  Cardiovascular:  Positive for leg swelling.  Musculoskeletal:  Positive for arthralgias.  All other systems reviewed and are negative.      Objective:   Physical Exam Vitals reviewed.  Cardiovascular:     Rate and Rhythm: Normal rate.  Pulmonary:     Effort: Pulmonary effort is normal.  Musculoskeletal:        General: Swelling and tenderness present.  Skin:    General: Skin is warm and dry.     Capillary Refill: Capillary refill takes less than 2 seconds.  Neurological:     Mental Status: He is alert and oriented to person, place, and time.     Gait: Gait abnormal.  Psychiatric:        Mood and Affect: Mood normal.        Behavior: Behavior normal.        Thought Content: Thought content normal.        Judgment: Judgment normal.     BP (!) 163/77 (BP Location: Left Arm)   Pulse 90   Resp 19   Ht 5\' 8"  (1.727 m)   Wt 267 lb 6.4 oz (121.3 kg)    BMI 40.66 kg/m   Past Medical History:  Diagnosis Date   Diabetes mellitus without complication (HCC)    Hypercholesteremia    Hypertension    Sleep apnea     Social History   Socioeconomic History   Marital status: Single    Spouse name: Not on file   Number of children: Not on file   Years of education: Not on file   Highest education level: Not on file  Occupational History   Not on file  Tobacco Use   Smoking status: Former    Current packs/day: 0.00    Average packs/day: 1 pack/day for 25.0 years (25.0 ttl pk-yrs)    Types: Cigarettes    Start date: 03/01/1967    Quit date: 02/29/1992    Years since quitting: 30.5    Passive exposure: Past   Smokeless tobacco: Never  Vaping Use   Vaping status: Never Used  Substance and Sexual Activity   Alcohol use: Yes    Comment: infreqently   Drug use: No   Sexual activity: Yes  Other Topics Concern  Not on file  Social History Narrative   Not on file   Social Determinants of Health   Financial Resource Strain: Not on file  Food Insecurity: Not on file  Transportation Needs: Not on file  Physical Activity: Not on file  Stress: Not on file  Social Connections: Not on file  Intimate Partner Violence: Not on file    Past Surgical History:  Procedure Laterality Date   COLONOSCOPY WITH PROPOFOL N/A 01/04/2022   Procedure: COLONOSCOPY WITH PROPOFOL;  Surgeon: Wyline Mood, MD;  Location: [facility] ENDOSCOPY;  Service: Gastroenterology;  Laterality: N/A;   ESOPHAGOGASTRODUODENOSCOPY N/A 01/04/2022   Procedure: ESOPHAGOGASTRODUODENOSCOPY (EGD);  Surgeon: Wyline Mood, MD;  Location: [facility] END[facility]COPY;  Service: Gastroenterology;  Laterality: N/A;   HERNIA REPAIR      Family History  Problem Relation Age of Onset   Prostate cancer Neg Hx    Bladder Cancer Neg Hx    Kidney cancer Neg Hx     No Known Allergies     Latest Ref Rng & Units 06/13/2022   10:32 AM 03/23/2022    8:50 AM 02/09/2022    9:01 AM  CBC  WBC 4.0 - 10.5  K/uL 6.0  6.1  6.0   Hemoglobin 13.0 - 17.0 g/dL 29.5  62.1  30.8   Hematocrit 39.0 - 52.0 % 37.4  40.6  40.9   Platelets 150 - 400 K/uL 339  282  295       CMP     Component Value Date/Time   NA 137 03/23/2022 0850   NA 132 (L) 01/29/2013 1038   K 3.6 03/23/2022 0850   K 4.3 01/29/2013 1038   CL 103 03/23/2022 0850   CL 102 01/29/2013 1038   CO2 23 03/23/2022 0850   CO2 25 01/29/2013 1038   GLUCOSE 146 (H) 03/23/2022 0850   GLUCOSE 188 (H) 01/29/2013 1038   BUN 8 03/23/2022 0850   BUN 20 (H) 01/29/2013 1038   CREATININE 0.74 03/23/2022 0850   CREATININE 1.30 01/29/2013 1038   CALCIUM 9.2 03/23/2022 0850   CALCIUM 8.7 01/29/2013 1038   PROT 7.5 03/23/2022 0850   ALBUMIN 3.9 03/23/2022 0850   AST 19 03/23/2022 0850   ALT 20 03/23/2022 0850   ALKPHOS 77 03/23/2022 0850   BILITOT 0.6 03/23/2022 0850   GFRNONAA >60 03/23/2022 0850   GFRNONAA 59 (L) 01/29/2013 1038     No results found.     Assessment & Plan:   1. Left leg pain The patient's pain is originating from his toes and radiating upwards.  They are very sensitive to the touch.  I suspect this is related to an arthritis flare.  We will send the patient in a Medrol Dosepak to see if this helps to improve the pain and discomfort.  He will also continue to follow with the pain treatment center for his neuropathy.  2. Type 2 diabetes mellitus with hyperglycemia, with long-term current use of insulin (HCC) Continue hypoglycemic medications as already ordered, these medications have been reviewed and there are no changes at this time.  Hgb A1C to be monitored as already arranged by primary service  3. Essential hypertension Continue antihypertensive medications as already ordered, these medications have been reviewed and there are no changes at this time.  4. Pain and swelling of lower leg, unspecified laterality We previously evaluated ABIs, which were normal, but we have never evaluated any venous studies to  determine if the patient has some underlying venous insufficiency contributing to  his edema.  I suspect some of the edema is secondary to the arthritis that is ongoing in his foot however the swelling itself can cause some inflammation which also can worsen his pain.  We will apply Ace bandages today to help with swelling.   Current Outpatient Medications on File Prior to Visit  Medication Sig Dispense Refill   ACCU-CHEK AVIVA PLUS test strip      albuterol (VENTOLIN HFA) 108 (90 Base) MCG/ACT inhaler Inhale into the lungs.     allopurinol (ZYLOPRIM) 100 MG tablet Take 1 tablet by mouth daily.     aspirin 81 MG tablet Take 81 mg by mouth daily.     atorvastatin (LIPITOR) 40 MG tablet Take 40 mg by mouth daily.     BD INSULIN SYRINGE U/F 31G X 5/16" 0.5 ML MISC 4 (four) times daily.     cyanocobalamin (VITAMIN B12) 1000 MCG tablet Take 1,000 mcg by mouth daily.     gabapentin (NEURONTIN) 400 MG capsule Take 1 capsule (400 mg total) by mouth every 8 (eight) hours. 90 capsule 2   insulin glargine (LANTUS) 100 UNIT/ML injection Inject 50 Units into the skin 2 (two) times daily.     insulin lispro (HUMALOG) 100 UNIT/ML injection Inject 30 Units into the skin 3 (three) times daily with meals.     JARDIANCE 10 MG TABS tablet Take 10 mg by mouth daily.     lisinopril (PRINIVIL,ZESTRIL) 10 MG tablet Take 10 mg by mouth daily.     tamsulosin (FLOMAX) 0.4 MG CAPS capsule TAKE 1 CAPSULE BY MOUTH EVERY DAY 30 MINUTES AFTER THE SAME MEAL     No current facility-administered medications on file prior to visit.    There are no Patient Instructions on file for this visit. No follow-ups on file.   Georgiana Spinner, NP

## 2022-10-17 ENCOUNTER — Ambulatory Visit: Payer: 59 | Admitting: Internal Medicine

## 2022-10-17 NOTE — Progress Notes (Signed)
Pt canceled his appointment.  

## 2022-10-20 ENCOUNTER — Other Ambulatory Visit (INDEPENDENT_AMBULATORY_CARE_PROVIDER_SITE_OTHER): Payer: Self-pay | Admitting: Nurse Practitioner

## 2022-10-20 DIAGNOSIS — M79669 Pain in unspecified lower leg: Secondary | ICD-10-CM

## 2022-10-20 DIAGNOSIS — M79605 Pain in left leg: Secondary | ICD-10-CM

## 2022-10-20 DIAGNOSIS — M7989 Other specified soft tissue disorders: Secondary | ICD-10-CM

## 2022-10-24 ENCOUNTER — Ambulatory Visit (INDEPENDENT_AMBULATORY_CARE_PROVIDER_SITE_OTHER): Payer: 59

## 2022-10-24 ENCOUNTER — Encounter (INDEPENDENT_AMBULATORY_CARE_PROVIDER_SITE_OTHER): Payer: Self-pay | Admitting: Vascular Surgery

## 2022-10-24 ENCOUNTER — Ambulatory Visit (INDEPENDENT_AMBULATORY_CARE_PROVIDER_SITE_OTHER): Payer: 59 | Admitting: Vascular Surgery

## 2022-10-24 VITALS — BP 154/84 | HR 81 | Resp 16 | Wt 266.4 lb

## 2022-10-24 DIAGNOSIS — Z794 Long term (current) use of insulin: Secondary | ICD-10-CM

## 2022-10-24 DIAGNOSIS — I70213 Atherosclerosis of native arteries of extremities with intermittent claudication, bilateral legs: Secondary | ICD-10-CM | POA: Diagnosis not present

## 2022-10-24 DIAGNOSIS — E782 Mixed hyperlipidemia: Secondary | ICD-10-CM

## 2022-10-24 DIAGNOSIS — E1165 Type 2 diabetes mellitus with hyperglycemia: Secondary | ICD-10-CM

## 2022-10-24 DIAGNOSIS — M79669 Pain in unspecified lower leg: Secondary | ICD-10-CM | POA: Diagnosis not present

## 2022-10-24 DIAGNOSIS — I10 Essential (primary) hypertension: Secondary | ICD-10-CM | POA: Diagnosis not present

## 2022-10-24 DIAGNOSIS — M7989 Other specified soft tissue disorders: Secondary | ICD-10-CM | POA: Diagnosis not present

## 2022-10-24 DIAGNOSIS — M25572 Pain in left ankle and joints of left foot: Secondary | ICD-10-CM

## 2022-10-24 DIAGNOSIS — M79605 Pain in left leg: Secondary | ICD-10-CM

## 2022-10-24 DIAGNOSIS — M25579 Pain in unspecified ankle and joints of unspecified foot: Secondary | ICD-10-CM | POA: Insufficient documentation

## 2022-10-24 MED ORDER — OXYCODONE-ACETAMINOPHEN 5-325 MG PO TABS: 1.0000 | ORAL_TABLET | ORAL | 0 refills | Status: AC | PRN

## 2022-10-30 ENCOUNTER — Encounter (INDEPENDENT_AMBULATORY_CARE_PROVIDER_SITE_OTHER): Payer: Self-pay | Admitting: Vascular Surgery

## 2022-10-30 NOTE — Progress Notes (Signed)
MRN : [id number]  Randy [name] is a 72 y.o. (10-23-1950) male who presents with chief complaint of check circulation.  History of Present Illness:   The patient returns to the office for followup and review of the noninvasive studies.   There have been no interval changes in lower extremity symptoms. No interval shortening of the patient's claudication distance or development of rest pain symptoms. No new ulcers or wounds have occurred since the last visit.  There have been no significant changes to the patient's overall health care.  The patient denies amaurosis fugax or recent TIA symptoms. There are no documented recent neurological changes noted. There is no history of DVT, PE or superficial thrombophlebitis. The patient denies recent episodes of angina or shortness of breath.   Duplex ultrasound of the venous system left lower extremity shows widely patent deep venous system.  Superficial system is patent and there is no evidence of reflux no evidence of DVT or superficial thrombophlebitis  Current Meds  Medication Sig   ACCU-CHEK AVIVA PLUS test strip    albuterol (VENTOLIN HFA) 108 (90 Base) MCG/ACT inhaler Inhale into the lungs.   allopurinol (ZYLOPRIM) 100 MG tablet Take 1 tablet by mouth daily.   aspirin 81 MG tablet Take 81 mg by mouth daily.   atorvastatin (LIPITOR) 40 MG tablet Take 40 mg by mouth daily.   BD INSULIN SYRINGE U/F 31G X 5/16" 0.5 ML MISC 4 (four) times daily.   cyanocobalamin (VITAMIN B12) 1000 MCG tablet Take 1,000 mcg by mouth daily.   gabapentin (NEURONTIN) 400 MG capsule Take 1 capsule (400 mg total) by mouth every 8 (eight) hours.   insulin glargine (LANTUS) 100 UNIT/ML injection Inject 50 Units into the skin 2 (two) times daily.   insulin lispro (HUMALOG) 100 UNIT/ML injection Inject 30 Units into the skin 3 (three) times daily with meals.   JARDIANCE 10 MG TABS tablet Take 10  mg by mouth daily.   lidocaine (XYLOCAINE) 5 % ointment Apply 1 Application topically daily as needed.   lisinopril (PRINIVIL,ZESTRIL) 10 MG tablet Take 10 mg by mouth daily.   oxyCODONE-acetaminophen (PERCOCET/ROXICET) 5-325 MG tablet Take 1 tablet by mouth every 4 (four) hours as needed for severe pain.   tamsulosin (FLOMAX) 0.4 MG CAPS capsule TAKE 1 CAPSULE BY MOUTH EVERY DAY 30 MINUTES AFTER THE SAME MEAL    Past Medical History:  Diagnosis Date   Diabetes mellitus without complication (HCC)    Hypercholesteremia    Hypertension    Sleep apnea     Past Surgical History:  Procedure Laterality Date   COLONOSCOPY WITH PROPOFOL N/A 01/04/2022   Procedure: COLONOSCOPY WITH PROPOFOL;  Surgeon: Wyline Mood, MD;  Location: [facility] ENDOSCOPY;  Service: Gastroenterology;  Laterality: N/A;   ESOPHAGOGASTRODUODENOSCOPY N/A 01/04/2022   Procedure: ESOPHAGOGASTRODUODENOSCOPY (EGD);  Surgeon: Wyline Mood, MD;  Location: [facility] ENDOSCOPY;  Service: Gastroenterology;  Laterality: N/A;   HERNIA REPAIR      Social History Social History   Tobacco Use   Smoking status: Former    Current packs/day: 0.00    Average packs/day: 1 pack/day  for 25.0 years (25.0 ttl pk-yrs)    Types: Cigarettes    Start date: 03/01/1967    Quit date: 02/29/1992    Years since quitting: 30.6    Passive exposure: Past   Smokeless tobacco: Never  Vaping Use   Vaping status: Never Used  Substance Use Topics   Alcohol use: Yes    Comment: infreqently   Drug use: No    Family History Family History  Problem Relation Age of Onset   Prostate cancer Neg Hx    Bladder Cancer Neg Hx    Kidney cancer Neg Hx     No Known Allergies   REVIEW OF SYSTEMS (Negative unless checked)  Constitutional: [] Weight loss  [] Fever  [] Chills Cardiac: [] Chest pain   [] Chest pressure   [] Palpitations   [] Shortness of breath when laying flat   [] Shortness of breath with exertion. Vascular:  [x] Pain in legs with walking   [] Pain in legs at  rest  [] History of DVT   [] Phlebitis   [] Swelling in legs   [] Varicose veins   [] Non-healing ulcers Pulmonary:   [] Uses home oxygen   [] Productive cough   [] Hemoptysis   [] Wheeze  [] COPD   [] Asthma Neurologic:  [] Dizziness   [] Seizures   [] History of stroke   [] History of TIA  [] Aphasia   [] Vissual changes   [] Weakness or numbness in arm   [] Weakness or numbness in leg Musculoskeletal:   [] Joint swelling   [] Joint pain   [] Low back pain Hematologic:  [] Easy bruising  [] Easy bleeding   [] Hypercoagulable state   [] Anemic Gastrointestinal:  [] Diarrhea   [] Vomiting  [] Gastroesophageal reflux/heartburn   [] Difficulty swallowing. Genitourinary:  [] Chronic kidney disease   [] Difficult urination  [] Frequent urination   [] Blood in urine Skin:  [] Rashes   [] Ulcers  Psychological:  [] History of anxiety   []  History of major depression.  Physical Examination  Vitals:   10/24/22 1431  BP: (!) 154/84  Pulse: 81  Resp: 16  Weight: 266 lb 6.4 oz (120.8 kg)   Body mass index is 40.51 kg/m. Gen: WD/WN, NAD Head: [place]/AT, No temporalis wasting.  Ear/Nose/Throat: Hearing grossly intact, nares w/o erythema or drainage Eyes: PER, EOMI, sclera nonicteric.  Neck: Supple, no masses.  No bruit or JVD.  Pulmonary:  Good air movement, no audible wheezing, no use of accessory muscles.  Cardiac: RRR, normal S1, S2, no Murmurs. Vascular:  mild trophic changes, no open wounds Vessel Right Left  Radial Palpable Palpable  PT Not Palpable Not Palpable  DP Not Palpable Not Palpable  Gastrointestinal: soft, non-distended. No guarding/no peritoneal signs.  Musculoskeletal: M/S 5/5 throughout.  No visible deformity.  Neurologic: CN 2-12 intact. Pain and light touch intact in extremities.  Symmetrical.  Speech is fluent. Motor exam as listed above. Psychiatric: Judgment intact, Mood & affect appropriate for pt's clinical situation. Dermatologic: No rashes or ulcers noted.  No changes consistent with  cellulitis.   CBC Lab Results  Component Value Date   WBC 6.0 06/13/2022   HGB 12.5 (L) 06/13/2022   HCT 37.4 (L) 06/13/2022   MCV 84.8 06/13/2022   PLT 339 06/13/2022    BMET    Component Value Date/Time   NA 137 03/23/2022 0850   NA 132 (L) 01/29/2013 1038   K 3.6 03/23/2022 0850   K 4.3 01/29/2013 1038   CL 103 03/23/2022 0850   CL 102 01/29/2013 1038   CO2 23 03/23/2022 0850   CO2 25 01/29/2013 1038   GLUCOSE 146 (H)  03/23/2022 0850   GLUCOSE 188 (H) 01/29/2013 1038   BUN 8 03/23/2022 0850   BUN 20 (H) 01/29/2013 1038   CREATININE 0.74 03/23/2022 0850   CREATININE 1.30 01/29/2013 1038   CALCIUM 9.2 03/23/2022 0850   CALCIUM 8.7 01/29/2013 1038   GFRNONAA >60 03/23/2022 0850   GFRNONAA 59 (L) 01/29/2013 1038   GFRAA >60 03/09/2018 0835   GFRAA >60 01/29/2013 1038   CrCl cannot be calculated (Patient's most recent lab result is older than the maximum 21 days allowed.).  COAG No results found for: "INR", "PROTIME"  Radiology No results found.   Assessment/Plan 1. Pain of joint of left ankle and foot  Recommend:  The patient has evidence of atherosclerosis of the lower extremities with claudication.  The patient does not voice lifestyle limiting changes at this point in time.  Noninvasive studies do not suggest clinically significant change.  No invasive studies, angiography or surgery at this time The patient should continue walking and begin a more formal exercise program.  The patient should continue antiplatelet therapy and aggressive treatment of the lipid abnormalities  No changes in the patient's medications at this time  Continued surveillance is indicated as atherosclerosis is likely to progress with time.    The patient will continue follow up with noninvasive studies as ordered.   2. Atherosclerosis of native artery of both lower extremities with intermittent claudication (HCC)  Recommend:  The patient has evidence of atherosclerosis of  the lower extremities with claudication.  The patient does not voice lifestyle limiting changes at this point in time.  Noninvasive studies do not suggest clinically significant change.  No invasive studies, angiography or surgery at this time The patient should continue walking and begin a more formal exercise program.  The patient should continue antiplatelet therapy and aggressive treatment of the lipid abnormalities  No changes in the patient's medications at this time  Continued surveillance is indicated as atherosclerosis is likely to progress with time.    The patient will continue follow up with noninvasive studies as ordered.  - VAS [place] ABI WITH/WO TBI; Future  3. Essential hypertension Continue antihypertensive medications as already ordered, these medications have been reviewed and there are no changes at this time.  4. Type 2 diabetes mellitus with hyperglycemia, with long-term current use of insulin (HCC) Continue hypoglycemic medications as already ordered, these medications have been reviewed and there are no changes at this time.  Hgb A1C to be monitored as already arranged by primary service  5. Mixed hyperlipidemia Continue statin as ordered and reviewed, no changes at this time    Levora Dredge, MD  10/30/2022 11:06 AM

## 2022-11-30 ENCOUNTER — Ambulatory Visit: Payer: 59 | Admitting: Student in an Organized Health Care Education/Training Program

## 2022-12-08 ENCOUNTER — Encounter: Payer: Self-pay | Admitting: Internal Medicine

## 2022-12-12 ENCOUNTER — Other Ambulatory Visit: Payer: 59

## 2022-12-12 DIAGNOSIS — N138 Other obstructive and reflux uropathy: Secondary | ICD-10-CM

## 2022-12-12 DIAGNOSIS — R351 Nocturia: Secondary | ICD-10-CM

## 2022-12-13 ENCOUNTER — Inpatient Hospital Stay (HOSPITAL_BASED_OUTPATIENT_CLINIC_OR_DEPARTMENT_OTHER): Payer: 59 | Admitting: Nurse Practitioner

## 2022-12-13 ENCOUNTER — Inpatient Hospital Stay: Payer: 59

## 2022-12-13 ENCOUNTER — Encounter: Payer: Self-pay | Admitting: Nurse Practitioner

## 2022-12-13 ENCOUNTER — Inpatient Hospital Stay: Payer: 59 | Attending: Nurse Practitioner

## 2022-12-13 VITALS — BP 157/72 | HR 80 | Temp 97.7°F | Ht 68.0 in | Wt 272.0 lb

## 2022-12-13 DIAGNOSIS — Z87891 Personal history of nicotine dependence: Secondary | ICD-10-CM | POA: Diagnosis not present

## 2022-12-13 DIAGNOSIS — Z79899 Other long term (current) drug therapy: Secondary | ICD-10-CM | POA: Insufficient documentation

## 2022-12-13 DIAGNOSIS — D509 Iron deficiency anemia, unspecified: Secondary | ICD-10-CM | POA: Diagnosis not present

## 2022-12-13 DIAGNOSIS — I10 Essential (primary) hypertension: Secondary | ICD-10-CM | POA: Insufficient documentation

## 2022-12-13 DIAGNOSIS — E538 Deficiency of other specified B group vitamins: Secondary | ICD-10-CM

## 2022-12-13 DIAGNOSIS — E1159 Type 2 diabetes mellitus with other circulatory complications: Secondary | ICD-10-CM | POA: Diagnosis not present

## 2022-12-13 LAB — CBC WITH DIFFERENTIAL/PLATELET
Abs Immature Granulocytes: 0.02 10*3/uL (ref 0.00–0.07)
Basophils Absolute: 0.1 10*3/uL (ref 0.0–0.1)
Basophils Relative: 1 %
Eosinophils Absolute: 0.4 10*3/uL (ref 0.0–0.5)
Eosinophils Relative: 5 %
HCT: 38 % — ABNORMAL LOW (ref 39.0–52.0)
Hemoglobin: 12.8 g/dL — ABNORMAL LOW (ref 13.0–17.0)
Immature Granulocytes: 0 %
Lymphocytes Relative: 28 %
Lymphs Abs: 2 10*3/uL (ref 0.7–4.0)
MCH: 28.4 pg (ref 26.0–34.0)
MCHC: 33.7 g/dL (ref 30.0–36.0)
MCV: 84.3 fL (ref 80.0–100.0)
Monocytes Absolute: 0.9 10*3/uL (ref 0.1–1.0)
Monocytes Relative: 13 %
Neutro Abs: 3.8 10*3/uL (ref 1.7–7.7)
Neutrophils Relative %: 53 %
Platelets: 246 10*3/uL (ref 150–400)
RBC: 4.51 MIL/uL (ref 4.22–5.81)
RDW: 15 % (ref 11.5–15.5)
WBC: 7.1 10*3/uL (ref 4.0–10.5)
nRBC: 0 % (ref 0.0–0.2)

## 2022-12-13 LAB — IRON AND TIBC
Iron: 66 ug/dL (ref 45–182)
Saturation Ratios: 18 % (ref 17.9–39.5)
TIBC: 365 ug/dL (ref 250–450)
UIBC: 299 ug/dL

## 2022-12-13 LAB — PSA: Prostate Specific Ag, Serum: 1.5 ng/mL (ref 0.0–4.0)

## 2022-12-13 LAB — FERRITIN: Ferritin: 22 ng/mL — ABNORMAL LOW (ref 24–336)

## 2022-12-13 NOTE — Progress Notes (Signed)
[facility]  [phone] [phone]  ID: Randy [name] OB: December 18, 1950  MR#: [id number]  QIO#:[id number]  Patient Care Team: [facility] as PCP - General (General Practice)  REFERRING PROVIDER: Dr. Wyline Mood  REASON FOR REFERRAL: IDA  HPI: Randy [name] is a 72 y.o. male with past medical history of diabetes, hypertension, hyperlipidemia, iron deficiency anemia and H. pylori positive was referred to hematology clinic for further management of iron deficiency anemia.  He has been taking oral iron since June 2023.  Iron panel in September 2023 did not show any improvement.  CBC with differential was reviewed.  Patient has anemia since January 2022.  Iron studies not available until recently on 11/09/2021 which showed ferritin of 12 and percent saturation of 8.  Folic acid is normal.  Vitamin B12 is low at 216.  He was started on oral vitamin B12 1000 mcg 2 weeks ago. H. pylori breath test was also positive and started on triple drug therapy.  Patient has been taking meloxicam once a day for long time.  He did not know the indication for the drug.  He stopped taking it now.  Colonoscopy and endoscopy was performed by Dr. Tobi Bastos on 01/14/2022.  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 Two polyps removed from ascending colon showing tubular adenomas.   INTERVAL HISTORY-  Patient was seen today as follow-up as follow-up for iron deficiency anemia. He discontinued oral iron around April 2024. Overall feels well. Has some chronic pain in his feet but otherwise feels at baseline. Denies any neurologic complaints. Denies recent fevers or illnesses. Denies any easy bleeding or bruising. No melena or hematochezia. No pica or restless leg. Reports good appetite and denies weight loss. Denies chest pain. Denies any nausea, vomiting,  constipation, or diarrhea. Denies urinary complaints. Patient offers no further specific complaints today.   REVIEW OF SYSTEMS:   Review of Systems  Constitutional:  Negative for chills, fever, malaise/fatigue and weight loss.  HENT:  Negative for nosebleeds.   Eyes:  Negative for blurred vision and double vision.  Respiratory:  Negative for hemoptysis and shortness of breath.   Cardiovascular:  Negative for chest pain and palpitations.  Gastrointestinal:  Negative for abdominal pain, blood in stool and melena.  Genitourinary:  Negative for hematuria.  Musculoskeletal:  Negative for back pain, falls, joint pain and myalgias.  Skin:  Negative for itching and rash.  Neurological:  Positive for sensory change. Negative for dizziness, tingling, weakness and headaches.  Endo/Heme/Allergies:  Negative for environmental allergies. Does not bruise/bleed easily.  Psychiatric/Behavioral:  Negative for depression. The patient is not nervous/anxious.   All other systems reviewed and are negative. As per HPI. Otherwise, a complete review of systems is negative.  PAST MEDICAL HISTORY: Past Medical History:  Diagnosis Date   Diabetes mellitus without complication (HCC)    Hypercholesteremia    Hypertension    Sleep apnea     PAST SURGICAL HISTORY: Past Surgical History:  Procedure Laterality Date   COLONOSCOPY WITH PROPOFOL N/A 01/04/2022   Procedure: COLONOSCOPY WITH PROPOFOL;  Surgeon: Wyline Mood, MD;  Location: [facility] ENDOSCOPY;  Service: Gastroenterology;  Laterality: N/A;   ESOPHAGOGASTRODUODENOSCOPY N/A 01/04/2022   Procedure: ESOPHAGOGASTRODUODENOSCOPY (EGD);  Surgeon: Wyline Mood, MD;  Location: [facility] ENDOSCOPY;  Service: Gastroenterology;  Laterality: N/A;   HERNIA REPAIR      FAMILY HISTORY: Family History  Problem Relation  Age of Onset   Prostate cancer Neg Hx    Bladder Cancer Neg Hx    Kidney cancer Neg Hx     HEALTH MAINTENANCE: Social History   Tobacco Use   Smoking  status: Former    Current packs/day: 0.00    Average packs/day: 1 pack/day for 25.0 years (25.0 ttl pk-yrs)    Types: Cigarettes    Start date: 03/01/1967    Quit date: 02/29/1992    Years since quitting: 30.8    Passive exposure: Past   Smokeless tobacco: Never  Vaping Use   Vaping status: Never Used  Substance Use Topics   Alcohol use: Yes    Comment: infreqently   Drug use: No     No Known Allergies  Current Outpatient Medications  Medication Sig Dispense Refill   ACCU-CHEK AVIVA PLUS test strip      albuterol (VENTOLIN HFA) 108 (90 Base) MCG/ACT inhaler Inhale into the lungs.     allopurinol (ZYLOPRIM) 100 MG tablet Take 1 tablet by mouth daily.     aspirin 81 MG tablet Take 81 mg by mouth daily.     atorvastatin (LIPITOR) 40 MG tablet Take 40 mg by mouth daily.     BD INSULIN SYRINGE U/F 31G X 5/16" 0.5 ML MISC 4 (four) times daily.     cyanocobalamin (VITAMIN B12) 1000 MCG tablet Take 1,000 mcg by mouth daily.     gabapentin (NEURONTIN) 400 MG capsule Take 1 capsule (400 mg total) by mouth every 8 (eight) hours. 90 capsule 2   insulin glargine (LANTUS) 100 UNIT/ML injection Inject 50 Units into the skin 2 (two) times daily.     insulin lispro (HUMALOG) 100 UNIT/ML injection Inject 30 Units into the skin 3 (three) times daily with meals.     JARDIANCE 10 MG TABS tablet Take 10 mg by mouth daily.     lidocaine (XYLOCAINE) 5 % ointment Apply 1 Application topically daily as needed.     lisinopril (PRINIVIL,ZESTRIL) 10 MG tablet Take 10 mg by mouth daily.     oxyCODONE-acetaminophen (PERCOCET/ROXICET) 5-325 MG tablet Take 1 tablet by mouth every 4 (four) hours as needed for severe pain. 30 tablet 0   tamsulosin (FLOMAX) 0.4 MG CAPS capsule TAKE 1 CAPSULE BY MOUTH EVERY DAY 30 MINUTES AFTER THE SAME MEAL     No current facility-administered medications for this visit.    OBJECTIVE: Vitals:   12/13/22 1241 12/13/22 1309  BP: (!) 162/67 (!) 157/72  Pulse: 80   Temp: 97.7 F  (36.5 C)   SpO2: 95%       Body mass index is 41.36 kg/m.      General: Well-developed, well-nourished, no acute distress. Obese. Unaccompanied.  Eyes: Pink conjunctiva, anicteric sclera. Lungs: Clear to auscultation bilaterally.  No audible wheezing or coughing Heart: Regular rate and rhythm.  Abdomen: Soft, nontender, nondistended.  Musculoskeletal: No edema, cyanosis, or clubbing. Cane for ambulation Neuro: Alert, answering all questions appropriately. Cranial nerves grossly intact. Skin: No rashes or petechiae noted. Psych: Normal affect.    LAB RESULTS:  Lab Results  Component Value Date   NA 137 03/23/2022   K 3.6 03/23/2022   CL 103 03/23/2022   CO2 23 03/23/2022   GLUCOSE 146 (H) 03/23/2022   BUN 8 03/23/2022   CREATININE 0.74 03/23/2022   CALCIUM 9.2 03/23/2022   PROT 7.5 03/23/2022   ALBUMIN 3.9 03/23/2022   AST 19 03/23/2022   ALT 20 03/23/2022   ALKPHOS 77 03/23/2022  BILITOT 0.6 03/23/2022   GFRNONAA >60 03/23/2022   GFRAA >60 03/09/2018    Lab Results  Component Value Date   WBC 7.1 12/13/2022   NEUTROABS 3.8 12/13/2022   HGB 12.8 (L) 12/13/2022   HCT 38.0 (L) 12/13/2022   MCV 84.3 12/13/2022   PLT 246 12/13/2022    Lab Results  Component Value Date   TIBC 330 06/13/2022   TIBC 330 02/09/2022   TIBC 326 11/09/2021   FERRITIN 17 (L) 06/13/2022   FERRITIN 35 02/09/2022   FERRITIN 12 (L) 11/09/2021   IRONPCTSAT 16 (L) 06/13/2022   IRONPCTSAT 17 (L) 02/09/2022   IRONPCTSAT 8 (LL) 11/09/2021     STUDIES: No results found.  ASSESSMENT AND PLAN:   Randy [name] is a 72 y.o. male with pmh of diabetes, hypertension, hyperlipidemia, iron deficiency anemia and H. pylori positive was referred to hematology clinic for further management of iron deficiency anemia.  # Iron deficiency anemia  - Etiology thought to be secondary to GI bleed- Colonoscopy and endoscopy was performed by Dr. Tobi Bastos on 01/14/2022.  Showed single small non bleeding  angioectasia in the stomach and multiple bleeding angioectasias in the duodenum treated with argon coagulation. Two polyps removed from ascending colon showing tubular adenomas.  - s/p IV Feraheme. Tolerated well. Was previously on oral iron twice a day. Last received IV Feraheme 06/27/22. He self discontinued oral iron. Hemoglobin is 12.8 today. Stable. Offered IV feraheme today but he prefers to restart oral iron which is reasonable. If persistently low iron levels at next visit recommend IV iron.   # B12 deficiency -B12 level from 11/09/2021 216 -Celiac panel was negative -Continue with B12 1000 mcg daily.  B12 improved to around 600. - Level today pending.   # H pylori positive -Triple drug therapy on 11/17/2021 by GI.  # Left ankle pain -Follows with [facility].  No improvement with brace and Voltaren gel.  Cannot tolerate Tylenol.  Cannot take NSAIDs due to GI bleed. -Continue with [facility]  Disposition:  3-4 month - lab (cbc, bmp, ferritin, iron studies, b12), Dr Alena Bills, +/- feraheme- la  No orders of the defined types were placed in this encounter.  Patient expressed understanding and was in agreement with this plan. He also understands that He can call clinic at any time with any questions, concerns, or complaints.   I spent a total of 15 minutes reviewing chart data, face-to-face evaluation with the patient, counseling and coordination of care as detailed above.  Alinda Dooms, NP   12/13/2022

## 2022-12-13 NOTE — Progress Notes (Signed)
Fatigue/weakness: no Dyspena: no Light headedness: no Blood in stool:  no 

## 2022-12-13 NOTE — Progress Notes (Signed)
NO treatment today

## 2022-12-13 NOTE — Addendum Note (Signed)
Addended by: Clydia Llano on: 12/13/2022 01:56 PM   Modules accepted: Orders

## 2022-12-14 LAB — VITAMIN B12: Vitamin B-12: 883 pg/mL (ref 180–914)

## 2022-12-14 NOTE — Progress Notes (Deleted)
12/14/22 1:24 PM   Randy [name] 1950-03-31 [id number]  Referring provider:  [facility] [address] [place],  [place] [address]  Urological history  1. BPH w/ LUTS - PSA (11/2022) 1.5 -TRUS (11/2021) - 83 cc - cysto (11/2021) - Enlarged prostate bilobar coaptation - slight intravesical protrusion of kissing lateral lobes, no median lobe  - tamsulosin 0.4 mg and finasteride 5 mg daily  2. Nocturia -Risk factors for nocturia: obstructive sleep apnea, hypertension, diabetes and BPH -untreated sleep apnea   HPI: Randy [name] is a 72 y.o.male who presents today for six month follow up.   Previous records reviewed.   I PSS ***  PVR *** mL         Score:  1-7 Mild 8-19 Moderate 20-35 Severe      PMH: Past Medical History:  Diagnosis Date   Diabetes mellitus without complication (HCC)    Hypercholesteremia    Hypertension    Sleep apnea     Surgical History: Past Surgical History:  Procedure Laterality Date   COLONOSCOPY WITH PROPOFOL N/A 01/04/2022   Procedure: COLONOSCOPY WITH PROPOFOL;  Surgeon: Wyline Mood, MD;  Location: [facility] ENDOSCOPY;  Service: Gastroenterology;  Laterality: N/A;   ESOPHAGOGASTRODUODENOSCOPY N/A 01/04/2022   Procedure: ESOPHAGOGASTRODUODENOSCOPY (EGD);  Surgeon: Wyline Mood, MD;  Location: [facility] ENDOSCOPY;  Service: Gastroenterology;  Laterality: N/A;   HERNIA REPAIR      Home Medications:  Allergies as of 12/16/2022   No Known Allergies      Medication List        Accurate as of December 14, 2022  1:24 PM. If you have any questions, ask your nurse or doctor.          Accu-Chek Aviva Plus test strip Generic drug: glucose blood   albuterol 108 (90 Base) MCG/ACT inhaler Commonly known as: VENTOLIN HFA Inhale into the lungs.   allopurinol 100 MG tablet Commonly known as: ZYLOPRIM Take 1 tablet by mouth daily.   aspirin 81 MG tablet Take 81 mg by mouth daily.    atorvastatin 40 MG tablet Commonly known as: LIPITOR Take 40 mg by mouth daily.   BD Insulin Syringe U/F 31G X 5/16" 0.5 ML Misc Generic drug: Insulin Syringe-Needle U-100 4 (four) times daily.   cyanocobalamin 1000 MCG tablet Commonly known as: VITAMIN B12 Take 1,000 mcg by mouth daily.   gabapentin 400 MG capsule Commonly known as: Neurontin Take 1 capsule (400 mg total) by mouth every 8 (eight) hours.   insulin glargine 100 UNIT/ML injection Commonly known as: LANTUS Inject 50 Units into the skin 2 (two) times daily.   insulin lispro 100 UNIT/ML injection Commonly known as: HUMALOG Inject 30 Units into the skin 3 (three) times daily with meals.   Jardiance 10 MG Tabs tablet Generic drug: empagliflozin Take 10 mg by mouth daily.   lidocaine 5 % ointment Commonly known as: XYLOCAINE Apply 1 Application topically daily as needed.   lisinopril 10 MG tablet Commonly known as: ZESTRIL Take 10 mg by mouth daily.   oxyCODONE-acetaminophen 5-325 MG tablet Commonly known as: PERCOCET/ROXICET Take 1 tablet by mouth every 4 (four) hours as needed for severe pain.   tamsulosin 0.4 MG Caps capsule Commonly known as: FLOMAX TAKE 1 CAPSULE BY MOUTH EVERY DAY 30 MINUTES AFTER THE SAME MEAL        Allergies:  No Known Allergies  Family History: Family History  Problem Relation Age of Onset   Prostate cancer Neg  Hx    Bladder Cancer Neg Hx    Kidney cancer Neg Hx     Social History:  reports that he quit smoking about 30 years ago. His smoking use included cigarettes. He started smoking about 55 years ago. He has a 25 pack-year smoking history. He has been exposed to tobacco smoke. He has never used smokeless tobacco. He reports current alcohol use. He reports that he does not use drugs.   Physical Exam: There were no vitals taken for this visit.  Constitutional:  Well nourished. Alert and oriented, No acute distress. HEENT: [place] AT, moist mucus membranes.  Trachea  midline, no masses. Cardiovascular: No clubbing, cyanosis, or edema. Respiratory: Normal respiratory effort, no increased work of breathing. GI: Abdomen is soft, non tender, non distended, no abdominal masses. Liver and spleen not palpable.  No hernias appreciated.  Stool sample for occult testing is not indicated.   GU: No CVA tenderness.  No bladder fullness or masses.  Patient with circumcised/uncircumcised phallus. ***Foreskin easily retracted***  Urethral meatus is patent.  No penile discharge. No penile lesions or rashes. Scrotum without lesions, cysts, rashes and/or edema.  Testicles are located scrotally bilaterally. No masses are appreciated in the testicles. Left and right epididymis are normal. Rectal: Patient with  normal sphincter tone. Anus and perineum without scarring or rashes. No rectal masses are appreciated. Prostate is approximately *** grams, *** nodules are appreciated. Seminal vesicles are normal. Skin: No rashes, bruises or suspicious lesions. Lymph: No cervical or inguinal adenopathy. Neurologic: Grossly intact, no focal deficits, moving all 4 extremities. Psychiatric: Normal mood and affect.   Laboratory Data: Component     Latest Ref Rng 12/12/2022  Prostate Specific Ag, Serum     0.0 - 4.0 ng/mL 1.5    CBC    Component Value Date/Time   WBC 7.1 12/13/2022 1244   RBC 4.51 12/13/2022 1244   HGB 12.8 (L) 12/13/2022 1244   HGB 11.9 (L) 01/29/2013 1038   HCT 38.0 (L) 12/13/2022 1244   HCT 35.2 (L) 01/29/2013 1038   PLT 246 12/13/2022 1244   PLT 261 01/29/2013 1038   MCV 84.3 12/13/2022 1244   MCV 85 01/29/2013 1038   MCH 28.4 12/13/2022 1244   MCHC 33.7 12/13/2022 1244   RDW 15.0 12/13/2022 1244   RDW 15.6 (H) 01/29/2013 1038   LYMPHSABS 2.0 12/13/2022 1244   LYMPHSABS 2.2 01/29/2013 1038   MONOABS 0.9 12/13/2022 1244   MONOABS 0.8 01/29/2013 1038   EOSABS 0.4 12/13/2022 1244   EOSABS 0.1 01/29/2013 1038   BASOSABS 0.1 12/13/2022 1244   BASOSABS 0.0  01/29/2013 1038  I have reviewed the labs.  See HPI.       Pertinent Imaging: ***  Assessment & Plan:    1. BPH with LUTS -Currently at goal on tamsulosin and finasteride  2. Nocturia -He is still having issues obtaining a CPAP machine   No follow-ups on file.  Cloretta Ned   [facility] [address] [place], [place] [address] [phone]

## 2022-12-16 ENCOUNTER — Ambulatory Visit: Payer: 59 | Admitting: Urology

## 2022-12-16 DIAGNOSIS — R351 Nocturia: Secondary | ICD-10-CM

## 2022-12-16 DIAGNOSIS — N138 Other obstructive and reflux uropathy: Secondary | ICD-10-CM

## 2023-01-09 ENCOUNTER — Ambulatory Visit: Payer: 59 | Admitting: Student in an Organized Health Care Education/Training Program

## 2023-01-20 NOTE — Progress Notes (Signed)
Pt had to cancel his appointment due to not having transportation.

## 2023-01-23 ENCOUNTER — Ambulatory Visit: Payer: 59 | Admitting: Internal Medicine

## 2023-02-17 NOTE — Progress Notes (Signed)
Pt canceled his appointment.  

## 2023-02-20 ENCOUNTER — Ambulatory Visit: Payer: 59 | Admitting: Internal Medicine

## 2023-03-19 ENCOUNTER — Encounter: Payer: Self-pay | Admitting: Internal Medicine

## 2023-03-20 ENCOUNTER — Telehealth (INDEPENDENT_AMBULATORY_CARE_PROVIDER_SITE_OTHER): Payer: Self-pay

## 2023-03-20 NOTE — Telephone Encounter (Signed)
Patient called with left foot pain, he stated that the top of his foot is swollen with a red spot on it and some time it feels numb. This has been going on 2-3 days and has never happened before.   Please advise

## 2023-03-20 NOTE — Telephone Encounter (Signed)
Message given

## 2023-03-20 NOTE — Telephone Encounter (Signed)
Would advise patient to see PCP or podiatry for eval

## 2023-03-21 ENCOUNTER — Encounter: Payer: Self-pay | Admitting: Internal Medicine

## 2023-03-28 ENCOUNTER — Inpatient Hospital Stay (HOSPITAL_BASED_OUTPATIENT_CLINIC_OR_DEPARTMENT_OTHER): Payer: 59 | Admitting: Internal Medicine

## 2023-03-28 ENCOUNTER — Inpatient Hospital Stay: Payer: 59 | Attending: Nurse Practitioner

## 2023-03-28 ENCOUNTER — Encounter: Payer: Self-pay | Admitting: Internal Medicine

## 2023-03-28 ENCOUNTER — Inpatient Hospital Stay: Payer: 59

## 2023-03-28 VITALS — Temp 98.1°F | Resp 20 | Wt 276.0 lb

## 2023-03-28 DIAGNOSIS — I10 Essential (primary) hypertension: Secondary | ICD-10-CM | POA: Insufficient documentation

## 2023-03-28 DIAGNOSIS — D509 Iron deficiency anemia, unspecified: Secondary | ICD-10-CM | POA: Diagnosis not present

## 2023-03-28 DIAGNOSIS — E1159 Type 2 diabetes mellitus with other circulatory complications: Secondary | ICD-10-CM | POA: Insufficient documentation

## 2023-03-28 DIAGNOSIS — Z79899 Other long term (current) drug therapy: Secondary | ICD-10-CM | POA: Diagnosis not present

## 2023-03-28 DIAGNOSIS — Z87891 Personal history of nicotine dependence: Secondary | ICD-10-CM | POA: Insufficient documentation

## 2023-03-28 DIAGNOSIS — M25572 Pain in left ankle and joints of left foot: Secondary | ICD-10-CM | POA: Diagnosis not present

## 2023-03-28 DIAGNOSIS — E538 Deficiency of other specified B group vitamins: Secondary | ICD-10-CM | POA: Insufficient documentation

## 2023-03-28 LAB — CBC WITH DIFFERENTIAL (CANCER CENTER ONLY)
Abs Immature Granulocytes: 0.09 10*3/uL — ABNORMAL HIGH (ref 0.00–0.07)
Basophils Absolute: 0.1 10*3/uL (ref 0.0–0.1)
Basophils Relative: 1 %
Eosinophils Absolute: 0.3 10*3/uL (ref 0.0–0.5)
Eosinophils Relative: 4 %
HCT: 36.9 % — ABNORMAL LOW (ref 39.0–52.0)
Hemoglobin: 12.2 g/dL — ABNORMAL LOW (ref 13.0–17.0)
Immature Granulocytes: 1 %
Lymphocytes Relative: 29 %
Lymphs Abs: 2 10*3/uL (ref 0.7–4.0)
MCH: 28.2 pg (ref 26.0–34.0)
MCHC: 33.1 g/dL (ref 30.0–36.0)
MCV: 85.4 fL (ref 80.0–100.0)
Monocytes Absolute: 0.7 10*3/uL (ref 0.1–1.0)
Monocytes Relative: 10 %
Neutro Abs: 3.9 10*3/uL (ref 1.7–7.7)
Neutrophils Relative %: 55 %
Platelet Count: 315 10*3/uL (ref 150–400)
RBC: 4.32 MIL/uL (ref 4.22–5.81)
RDW: 14.6 % (ref 11.5–15.5)
WBC Count: 7.1 10*3/uL (ref 4.0–10.5)
nRBC: 0 % (ref 0.0–0.2)

## 2023-03-28 LAB — BASIC METABOLIC PANEL
Anion gap: 11 (ref 5–15)
BUN: 12 mg/dL (ref 8–23)
CO2: 23 mmol/L (ref 22–32)
Calcium: 8.7 mg/dL — ABNORMAL LOW (ref 8.9–10.3)
Chloride: 98 mmol/L (ref 98–111)
Creatinine, Ser: 0.81 mg/dL (ref 0.61–1.24)
GFR, Estimated: >60 mL/min (ref >60)
Glucose, Bld: 227 mg/dL — ABNORMAL HIGH (ref 70–99)
Potassium: 3.8 mmol/L (ref 3.5–5.1)
Sodium: 132 mmol/L — ABNORMAL LOW (ref 135–145)

## 2023-03-28 LAB — FERRITIN: Ferritin: 24 ng/mL (ref 24–336)

## 2023-03-28 LAB — IRON AND TIBC
Iron: 45 ug/dL (ref 45–182)
Saturation Ratios: 14 % — ABNORMAL LOW (ref 17.9–39.5)
TIBC: 315 ug/dL (ref 250–450)
UIBC: 270 ug/dL

## 2023-03-28 LAB — VITAMIN B12: Vitamin B-12: 1118 pg/mL — ABNORMAL HIGH (ref 180–914)

## 2023-03-28 MED ORDER — FERROUS SULFATE 325 (65 FE) MG PO TABS: 325.0000 mg | ORAL_TABLET | ORAL | 2 refills | Status: AC

## 2023-03-28 NOTE — Progress Notes (Signed)
Patient went and had a shingles vaccine at [facility] and ever since then patient's left foot has been swollen and in severe pain, he called them to let them know what was going on and they just tell him to come in for his next dose and he tells them no thanks. He rates his pain in his left foot at a 10.

## 2023-03-28 NOTE — Progress Notes (Signed)
Randy Randy [name]  [phone] [phone]  ID: AVYAAN SUMMER OB: Sep 28, 1950  MR#: [id number]  UYQ#:[id number]  Patient Care Team: Randy [name], [name] - General (General Practice) Michaelyn Barter, MD as Consulting Physician (Oncology)  REFERRING PROVIDER: Dr. Wyline Mood  REASON FOR REFERRAL: IDA  HPI: Randy Randy [name] is a 73 y.o. male with past medical history of diabetes, hypertension, hyperlipidemia, iron deficiency anemia and H. pylori positive was referred to hematology clinic for further management of iron deficiency anemia.  He has been taking oral iron since June 2023.  Iron panel in September 2023 did not show any improvement.  CBC with differential was reviewed.  Patient has anemia since January 2022.  Iron studies not available until recently on 11/09/2021 which showed ferritin of 12 and percent saturation of 8.  Folic acid is normal.  Vitamin B12 is low at 216.  He was started on oral vitamin B12 1000 mcg 2 weeks ago. H. pylori breath test was also positive and started on triple drug therapy.  Patient has been taking meloxicam once a day for long time.  He did not know the indication for the drug.  He stopped taking it now.  Colonoscopy and endoscopy was performed by Dr. Tobi Bastos on 01/14/2022.  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 Two polyps removed from ascending colon showing tubular adenomas.   INTERVAL HISTORY-  Patient was seen today as follow-up as follow-up for iron deficiency anemia. Patient reports he had shingles injection at the pharmacy and following that pain started in his left foot.  On prior records.  He has history of left foot pain.  Denies any swelling.  Denies any bleeding in urine or stools.  Taking his oral iron and B12.   REVIEW OF SYSTEMS:   Review of Systems  All other systems reviewed  and are negative.   As per HPI. Otherwise, a complete review of systems is negative.  PAST MEDICAL HISTORY: Past Medical History:  Diagnosis Date   Diabetes mellitus without complication (HCC)    Hypercholesteremia    Hypertension    Sleep apnea     PAST SURGICAL HISTORY: Past Surgical History:  Procedure Laterality Date   COLONOSCOPY WITH PROPOFOL N/A 01/04/2022   Procedure: COLONOSCOPY WITH PROPOFOL;  Surgeon: Wyline Mood, MD;  Location[name] END[facility]COPY;  Service: Gastroenterology;  Laterality: N/A;   ESOPHAGOGASTRODUODENOSCOPY N/A 01/04/2022   Procedure: ESOPHAGOGASTRODUODENOSCOPY (EGD);  Surgeon: Wyline Mood, MD;  [name];  Service: Gastroenterology;  Laterality: N/A;   HERNIA REPAIR      FAMILY HISTORY: Family History  Problem Relation Age of Onset   Prostate cancer Neg Hx    Bladder Cancer Neg Hx    Kidney cancer Neg Hx     HEALTH MAINTENANCE: Social History   Tobacco Use   Smoking status: Former    Current packs/day: 0.00    Average packs/day: 1 pack/day for 25.0 years (25.0 ttl pk-yrs)    Types: Cigarettes    Start date: 03/01/1967    Quit date: 02/29/1992    Years since quitting: 31.0    Passive exposure: Past   Smokeless tobacco: Never  Vaping Use   Vaping status: Never Used  Substance Use Topics   Alcohol use: Yes    Comment: infreqently   Drug use: No     No Known Allergies  Current Outpatient Medications  Medication Sig Dispense Refill  omeprazole (PRILOSEC) 20 MG capsule Take 20 mg by mouth daily.     ACCU-CHEK AVIVA PLUS test strip      albuterol (VENTOLIN HFA) 108 (90 Base) MCG/ACT inhaler Inhale into the lungs.     allopurinol (ZYLOPRIM) 100 MG tablet Take 1 tablet by mouth daily.     aspirin 81 MG tablet Take 81 mg by mouth daily.     atorvastatin (LIPITOR) 40 MG tablet Take 40 mg by mouth daily.     BD INSULIN SYRINGE U/F 31G X 5/16" 0.5 ML MISC 4 (four) times daily.     cyanocobalamin (VITAMIN B12) 1000 MCG tablet Take  1,000 mcg by mouth daily.     ferrous sulfate 325 (65 FE) MG tablet Take 1 tablet (325 mg total) by mouth every other day. 45 tablet 2   gabapentin (NEURONTIN) 400 MG capsule Take 1 capsule (400 mg total) by mouth every 8 (eight) hours. 90 capsule 2   insulin glargine (LANTUS) 100 UNIT/ML injection Inject 50 Units into the skin 2 (two) times daily.     insulin lispro (HUMALOG) 100 UNIT/ML injection Inject 30 Units into the skin 3 (three) times daily with meals.     JARDIANCE 10 MG TABS tablet Take 10 mg by mouth daily.     lidocaine (XYLOCAINE) 5 % ointment Apply 1 Application topically daily as needed.     lisinopril (PRINIVIL,ZESTRIL) 10 MG tablet Take 10 mg by mouth daily.     oxyCODONE-acetaminophen (PERCOCET/ROXICET) 5-325 MG tablet Take 1 tablet by mouth every 4 (four) hours as needed for severe pain. 30 tablet 0   tamsulosin (FLOMAX) 0.4 MG CAPS capsule TAKE 1 CAPSULE BY MOUTH EVERY DAY 30 MINUTES AFTER THE SAME MEAL     No current facility-administered medications for this visit.    OBJECTIVE: Vitals:   03/28/23 1423  Resp: 20  Temp: 98.1 F (36.7 C)     Body mass index is 41.97 kg/m.      General: Well-developed, well-nourished, no acute distress. Eyes: Pink conjunctiva, anicteric sclera. HEENT: Normocephalic, moist mucous membranes, clear oropharnyx. Lungs: Clear to auscultation bilaterally. Heart: Regular rate and rhythm. No rubs, murmurs, or gallops. Abdomen: Soft, nontender, nondistended. No organomegaly noted, normoactive bowel sounds. Musculoskeletal: No edema, cyanosis, or clubbing. Neuro: Alert, answering all questions appropriately. Cranial nerves grossly intact. Skin: No rashes or petechiae noted. Psych: Normal affect. Lymphatics: No cervical, calvicular, axillary or inguinal LAD.   LAB RESULTS:  Lab Results  Component Value Date   NA 132 (L) 03/28/2023   K 3.8 03/28/2023   CL 98 03/28/2023   CO2 23 03/28/2023   GLUCOSE 227 (H) 03/28/2023   BUN 12  03/28/2023   CREATININE 0.81 03/28/2023   CALCIUM 8.7 (L) 03/28/2023   PROT 7.5 03/23/2022   ALBUMIN 3.9 03/23/2022   AST 19 03/23/2022   ALT 20 03/23/2022   ALKPHOS 77 03/23/2022   BILITOT 0.6 03/23/2022   GFRNONAA >60 03/28/2023   GFRAA >60 03/09/2018    Lab Results  Component Value Date   WBC 7.1 03/28/2023   NEUTROABS 3.9 03/28/2023   HGB 12.2 (L) 03/28/2023   HCT 36.9 (L) 03/28/2023   MCV 85.4 03/28/2023   PLT 315 03/28/2023    Lab Results  Component Value Date   TIBC 365 12/13/2022   TIBC 330 06/13/2022   TIBC 330 02/09/2022   FERRITIN 22 (L) 12/13/2022   FERRITIN 17 (L) 06/13/2022   FERRITIN 35 02/09/2022   IRONPCTSAT 18 12/13/2022  IRONPCTSAT 16 (L) 06/13/2022   IRONPCTSAT 17 (L) 02/09/2022     STUDIES: No results found.  ASSESSMENT AND PLAN:   Randy Randy [name] is a 73 y.o. male with pmh of diabetes, hypertension, hyperlipidemia, iron deficiency anemia and H. pylori positive was referred to hematology clinic for further management of iron deficiency anemia.  # Iron deficiency anemia  - Colonoscopy and endoscopy was performed by Dr. Tobi Bastos on 01/14/2022.  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 Two polyps removed from ascending colon showing tubular adenomas.  -Last IV Feraheme was in April 2024.  Hemoglobin today stable at 12.2.  Iron panel is pending.  If low, will schedule for IV Venofer.  Discussed that it has been close to a year that he received IV iron and there would be an option to have labs monitored with PCP and follow-up with hematology as needed.  He will continue to have blood work checked here with [place].  # B12 deficiency -Celiac panel was negative -Continue with B12 1000 mcg daily.  B12 improved to around 600.  # H pylori positive -Triple drug therapy on 11/17/2021 by GI.  # Left ankle pain -Follows with [facility].  No improvement with  brace and Voltaren gel.  Cannot tolerate Tylenol.  Cannot take NSAIDs due to GI bleed. -Continue with [facility]  Orders Placed This Encounter  Procedures   CBC with Differential (Cancer Randy [name] Only)   Ferritin   Iron and TIBC([facility])   Vitamin B1   RTC in 6 months for MD visit, labs   Patient expressed understanding and was in agreement with this plan. He also understands that He can call clinic at any time with any questions, concerns, or complaints.   I spent a total of 25 minutes reviewing chart data, face-to-face evaluation with the patient, counseling and coordination of care as detailed above.  Michaelyn Barter, MD   03/28/2023 2:54 PM

## 2023-05-04 ENCOUNTER — Ambulatory Visit
Payer: 59 | Attending: Student in an Organized Health Care Education/Training Program | Admitting: Student in an Organized Health Care Education/Training Program

## 2023-05-04 ENCOUNTER — Encounter: Payer: Self-pay | Admitting: Student in an Organized Health Care Education/Training Program

## 2023-05-04 VITALS — BP 126/72 | HR 95 | Temp 97.2°F | Resp 17 | Ht 68.0 in | Wt 261.0 lb

## 2023-05-04 DIAGNOSIS — G5793 Unspecified mononeuropathy of bilateral lower limbs: Secondary | ICD-10-CM | POA: Diagnosis present

## 2023-05-04 DIAGNOSIS — G5732 Lesion of lateral popliteal nerve, left lower limb: Secondary | ICD-10-CM | POA: Insufficient documentation

## 2023-05-04 DIAGNOSIS — Z794 Long term (current) use of insulin: Secondary | ICD-10-CM | POA: Diagnosis not present

## 2023-05-04 DIAGNOSIS — E1140 Type 2 diabetes mellitus with diabetic neuropathy, unspecified: Secondary | ICD-10-CM | POA: Diagnosis not present

## 2023-05-04 DIAGNOSIS — M79672 Pain in left foot: Secondary | ICD-10-CM | POA: Insufficient documentation

## 2023-05-04 DIAGNOSIS — M792 Neuralgia and neuritis, unspecified: Secondary | ICD-10-CM | POA: Insufficient documentation

## 2023-05-04 NOTE — Progress Notes (Signed)
 Safety precautions to be maintained throughout the outpatient stay will include: orient to surroundings, keep bed in low position, maintain call bell within reach at all times, provide assistance with transfer out of bed and ambulation.

## 2023-05-04 NOTE — Patient Instructions (Signed)

## 2023-05-04 NOTE — Progress Notes (Signed)
 PROVIDER NOTE: Information contained herein reflects review and annotations entered in association with encounter. Interpretation of such information and data should be left to medically-trained personnel. Information provided to patient can be located elsewhere in the medical record under "Patient Instructions". Document created using STT-dictation technology, any transcriptional errors that may result from process are unintentional.    Patient: Randy [name]  Service Category: E/M  Provider: Edward Jolly, MD  DOB: 1950-03-22  DOS: 05/04/2023  Referring Provider: Center, Darc[facility] Gasman*  MRN: [id number]  Specialty: Interventional Pain Management  PCP: [facility]  Type: Established Patient  Setting: Ambulatory outpatient    Location: Office  Delivery: Face-to-face     HPI  Randy [name], a 73 y.o. year old male, is here today because of his Neuropathic pain of both feet [G57.93]. [name]'s primary complain today is Foot Pain (left)   Pain Assessment: Severity of Chronic pain is reported as a 8 /10. Location: Foot Left/to left ankle. Onset: More than a month ago. Quality: Aching. Timing: Constant ("aching pain when I flex my left foot"). Modifying factor(s): gabapentin. Vitals:  height is 5\' 8"  (1.727 m) and weight is 261 lb (118.4 kg). His temperature is 97.2 F (36.2 C) (abnormal). His blood pressure is 126/72 and his pulse is 95. His respiration is 17 and oxygen saturation is 100%.  BMI: Estimated body mass index is 39.68 kg/m as calculated from the following:   Height as of this encounter: 5\' 8"  (1.727 m).   Weight as of this encounter: 261 lb (118.4 kg). Last encounter: 07/11/2022. Last procedure: 08/29/2022.  Reason for encounter:   History of Present Illness   Randy [name] is a 73 year old male who presents with left foot pain.  He has been experiencing pain in his left foot, specifically localized to the area associated with the superficial  peroneal nerve. The pain does not radiate beyond this region.  The pain is described as traveling up through a particular part of his foot, indicating involvement of the superficial peroneal nerve. It has been persistent for more than three months.  He did experience benefit with Qutenza  He wants an injection to manage the pain.       ROS  Constitutional: Denies any fever or chills Gastrointestinal: No reported hemesis, hematochezia, vomiting, or acute GI distress Musculoskeletal: Denies any acute onset joint swelling, redness, loss of ROM, or weakness Neurological:  left superficial peroneal neuropathy  Medication Review  Insulin Syringe-Needle U-100, albuterol, allopurinol, aspirin, atorvastatin, cyanocobalamin, empagliflozin, ferrous sulfate, gabapentin, glucose blood, insulin glargine, insulin lispro, lidocaine, lisinopril, omeprazole, oxyCODONE-acetaminophen, and tamsulosin  History Review  Allergy: [name] has no known allergies. Drug: Randy [name]  reports no history of drug use. Alcohol:  reports current alcohol use. Tobacco:  reports that he quit smoking about 31 years ago. His smoking use included cigarettes. He started smoking about 56 years ago. He has a 25 pack-year smoking history. He has been exposed to tobacco smoke. He has never used smokeless tobacco. Social: Randy [name]  reports that he quit smoking about 31 years ago. His smoking use included cigarettes. He started smoking about 56 years ago. He has a 25 pack-year smoking history. He has been exposed to tobacco smoke. He has never used smokeless tobacco. He reports current alcohol use. He reports that he does not use drugs. Medical:  has a past medical history of Diabetes mellitus without complication (HCC), Hypercholesteremia, Hypertension, and Sleep apnea. Surgical: Mr.  [name]  has a past surgical history that includes Hernia repair; Esophagogastroduodenoscopy (N/A, 01/04/2022); and Colonoscopy with propofol (N/A,  01/04/2022). Family: family history is not on file.  Laboratory Chemistry Profile   Renal Lab Results  Component Value Date   BUN 12 03/28/2023   CREATININE 0.81 03/28/2023   GFRAA >60 03/09/2018   GFRNONAA >60 03/28/2023    Hepatic Lab Results  Component Value Date   AST 19 03/23/2022   ALT 20 03/23/2022   ALBUMIN 3.9 03/23/2022   ALKPHOS 77 03/23/2022   LIPASE 163 (H) 11/11/2020    Electrolytes Lab Results  Component Value Date   NA 132 (L) 03/28/2023   K 3.8 03/28/2023   CL 98 03/28/2023   CALCIUM 8.7 (L) 03/28/2023    Bone No results found for: "VD25OH", "VD125OH2TOT", "IO9629BM8", "UX3244WN0", "25OHVITD1", "25OHVITD2", "25OHVITD3", "TESTOFREE", "TESTOSTERONE"  Inflammation (CRP: Acute Phase) (ESR: Chronic Phase) No results found for: "CRP", "ESRSEDRATE", "LATICACIDVEN"       Note: Above Lab results reviewed.  Recent Imaging Review  VAS [place] LOWER EXTREMITY VENOUS REFLUX  Lower Venous Reflux Study  Patient Name:  Randy [name]  Date of Exam:   10/24/2022 Medical Rec #: [id number]         Accession #:    [id number] Date of Birth: Sep 17, 1950         Patient Gender: M Patient Age:   60 years Exam Location:  [facility] Procedure:      VAS [place] LOWER EXTREMITY VENOUS REFLUX Referring Phys: Sheppard Plumber  --------------------------------------------------------------------------------   Indications: Pain, Swelling, and Edema.   Performing Technologist: Hardie Lora RVT    Examination Guidelines: A complete evaluation includes B-mode imaging, spectral Doppler, color Doppler, and power Doppler as needed of all accessible portions of each vessel. Bilateral testing is considered an integral part of a complete examination. Limited examinations for reoccurring indications may be performed as noted. The reflux portion of the exam is performed with the patient in reverse Trendelenburg. Significant venous reflux is defined as >500 ms in the superficial  venous system, and >1 second in the deep venous system.    +--------------+---------+------+-----------+------------+--------+ LEFT          Reflux NoRefluxReflux TimeDiameter cmsComments                         Yes                                  +--------------+---------+------+-----------+------------+--------+ CFV           no                                             +--------------+---------+------+-----------+------------+--------+ FV prox       no                                             +--------------+---------+------+-----------+------------+--------+ FV mid        no                                             +--------------+---------+------+-----------+------------+--------+  FV dist       no                                             +--------------+---------+------+-----------+------------+--------+ Popliteal     no                                             +--------------+---------+------+-----------+------------+--------+ GSV at [facility]    no                            0.86             +--------------+---------+------+-----------+------------+--------+ GSV prox thighno                            0.71             +--------------+---------+------+-----------+------------+--------+ GSV mid thigh no                            0.62             +--------------+---------+------+-----------+------------+--------+ GSV dist thighno                            0.53             +--------------+---------+------+-----------+------------+--------+ GSV at knee   no                            0.58             +--------------+---------+------+-----------+------------+--------+ GSV prox calf no                            0.55             +--------------+---------+------+-----------+------------+--------+ SSV Pop Fossa no                            0.31              +--------------+---------+------+-----------+------------+--------+ SSV prox calf no                            0.31             +--------------+---------+------+-----------+------------+--------+ SSV mid calf  no                            0.30             +--------------+---------+------+-----------+------------+--------+       Summary: Left: - No evidence of deep vein thrombosis seen in the left lower extremity, from the common femoral through the popliteal veins. - No evidence of superficial venous thrombosis in the left lower extremity. - There is no evidence of venous reflux seen in the left lower extremity. - No evidence of superficial venous reflux seen in the left greater saphenous vein. - No evidence of superficial venous reflux seen in the left short saphenous vein.   *  See table(s) above for measurements and observations.  Electronically signed by Levora Dredge MD on 11/03/2022 at 8:30:21 AM.      Final   Note: Reviewed        Physical Exam  General appearance: Well nourished, well developed, and well hydrated. In no apparent acute distress Mental status: Alert, oriented x 3 (person, place, & time)       Respiratory: No evidence of acute respiratory distress Eyes: PERLA Vitals: BP 126/72   Pulse 95   Temp (!) 97.2 F (36.2 C)   Resp 17   Ht 5\' 8"  (1.727 m)   Wt 261 lb (118.4 kg)   SpO2 100%   BMI 39.68 kg/m  BMI: Estimated body mass index is 39.68 kg/m as calculated from the following:   Height as of this encounter: 5\' 8"  (1.727 m).   Weight as of this encounter: 261 lb (118.4 kg). Ideal: Ideal body weight: 68.4 kg (150 lb 12.7 oz) Adjusted ideal body weight: 88.4 kg (194 lb 14 oz)  Neuropathic pain of left foot in distribution of left peroneal nerve  Assessment   Diagnosis  1. Neuropathic pain of both feet   2. Chronic painful diabetic neuropathy (HCC)   3. Neuropathic pain   4. Left foot pain   5. Superficial peroneal nerve  neuropathy, left      Updated Problems: No problems updated.  Plan of Care      Left foot pain   Pain is localized to the left foot in the area innervated by the superficial peroneal nerve, persisting for over three months. A nerve block at the ankle may alleviate symptoms. Order a superficial peroneal nerve block procedure for the left foot and schedule the procedure upon approval.     Consider repeating  Qutenza for PDN in future    Orders:  Orders Placed This Encounter  Procedures   Misc procedure    LEFT superficial PERONEAL nerve block    Standing Status:   Future    Expected Date:   06/07/2023    Expiration Date:   08/04/2023    Scheduling Instructions:     LEFT superficial PERONEAL nerve block   Follow-up plan:   Return in about 1 month (around 06/07/2023) for Left superficial peroneal NB , in clinic NS.      Recent Visits No visits were found meeting these conditions. Showing recent visits within past 90 days and meeting all other requirements Today's Visits Date Type Provider Dept  05/04/23 Office Visit Edward Jolly, MD [facility]  Showing today's visits and meeting all other requirements Future Appointments Date Type Provider Dept  06/07/23 Appointment Edward Jolly, MD [facility]  Showing future appointments within next 90 days and meeting all other requirements  I discussed the assessment and treatment plan with the patient. The patient was provided an opportunity to ask questions and all were answered. The patient agreed with the plan and demonstrated an understanding of the instructions.  Patient advised to call back or seek an in-person evaluation if the symptoms or condition worsens.  Duration of encounter: .  Total time on encounter, as per AMA guidelines included both the face-to-face and non-face-to-face time personally spent by the physician and/or other qualified health care professional(s) on the day of the encounter  (includes time in activities that require the physician or other qualified health care professional and does not include time in activities normally performed by clinical staff). Physician's time may include the following activities when  performed: Preparing to see the patient (e.g., pre-charting review of records, searching for previously ordered imaging, lab work, and nerve conduction tests) Review of prior analgesic pharmacotherapies. Reviewing PMP Interpreting ordered tests (e.g., lab work, imaging, nerve conduction tests) Performing post-procedure evaluations, including interpretation of diagnostic procedures Obtaining and/or reviewing separately obtained history Performing a medically appropriate examination and/or evaluation Counseling and educating the patient/family/caregiver Ordering medications, tests, or procedures Referring and communicating with other health care professionals (when not separately reported) Documenting clinical information in the electronic or other health record Independently interpreting results (not separately reported) and communicating results to the patient/ family/caregiver Care coordination (not separately reported)  Note by: Edward Jolly, MD Date: 05/04/2023; Time: 1:44 PM

## 2023-06-07 ENCOUNTER — Ambulatory Visit
Attending: Student in an Organized Health Care Education/Training Program | Admitting: Student in an Organized Health Care Education/Training Program

## 2023-06-07 ENCOUNTER — Encounter: Payer: Self-pay | Admitting: Student in an Organized Health Care Education/Training Program

## 2023-06-07 VITALS — BP 119/60 | HR 86 | Temp 97.4°F | Resp 16 | Ht 68.0 in | Wt 274.0 lb

## 2023-06-07 DIAGNOSIS — M79672 Pain in left foot: Secondary | ICD-10-CM | POA: Insufficient documentation

## 2023-06-07 DIAGNOSIS — G5732 Lesion of lateral popliteal nerve, left lower limb: Secondary | ICD-10-CM | POA: Insufficient documentation

## 2023-06-07 DIAGNOSIS — G5793 Unspecified mononeuropathy of bilateral lower limbs: Secondary | ICD-10-CM

## 2023-06-07 DIAGNOSIS — G629 Polyneuropathy, unspecified: Secondary | ICD-10-CM | POA: Insufficient documentation

## 2023-06-07 MED ORDER — ROPIVACAINE HCL 2 MG/ML IJ SOLN
4.0000 mL | Freq: Once | INTRAMUSCULAR | Status: AC
  Administered 2023-06-07: 4 mL via INTRA_ARTICULAR

## 2023-06-07 MED ORDER — LIDOCAINE HCL (PF) 2 % IJ SOLN
INTRAMUSCULAR | Status: AC
  Filled 2023-06-07: qty 10

## 2023-06-07 MED ORDER — DEXAMETHASONE SODIUM PHOSPHATE 10 MG/ML IJ SOLN
20.0000 mg | Freq: Once | INTRAMUSCULAR | Status: AC
  Administered 2023-06-07: 20 mg

## 2023-06-07 MED ORDER — ROPIVACAINE HCL 2 MG/ML IJ SOLN
INTRAMUSCULAR | Status: AC
  Filled 2023-06-07: qty 20

## 2023-06-07 MED ORDER — LIDOCAINE HCL 2 % IJ SOLN
20.0000 mL | Freq: Once | INTRAMUSCULAR | Status: AC
  Administered 2023-06-07: 200 mg

## 2023-06-07 MED ORDER — DEXAMETHASONE SODIUM PHOSPHATE 10 MG/ML IJ SOLN
INTRAMUSCULAR | Status: AC
  Filled 2023-06-07: qty 2

## 2023-06-07 NOTE — Progress Notes (Signed)
 PROVIDER NOTE: Interpretation of information contained herein should be left to medically-trained personnel. Specific patient instructions are provided elsewhere under "Patient Instructions" section of medical record. This document was created in part using STT-dictation technology, any transcriptional errors that may result from this process are unintentional.  Patient: Randy [name] Type: Established DOB: 26-Oct-1950 MRN: [id number] PCP: [facility]  Service: Procedure DOS: 06/07/2023 Setting: Ambulatory Location: Ambulatory outpatient facility Delivery: Face-to-face Provider: Edward Jolly, MD Specialty: Interventional Pain Management Specialty designation: 09 Location: Outpatient facility Ref. Prov.: [facility]       Interventional Therapy     Procedure:          Anesthesia, Analgesia, Anxiolysis:  Left superficial peroneal nerve block at left lateral ankle under ultrasound guidance  Type: Local Anesthesia Local Anesthetic: Lidocaine 1-2% Sedation: None  Indication(s):  Analgesia Route: Infiltration ([place]/IM) IV Access: N/A   Position: Supine   1. Superficial peroneal nerve neuropathy, left   2. Left foot pain   3. Neuropathic pain of both feet    NAS-11 Pain score:   Pre-procedure: 10-Worst pain ever/10   Post-procedure: 2/10     H&P (Pre-op Assessment):  Randy [name] is a 73 y.o. (year old), male patient, seen today for interventional treatment. He  has a past surgical history that includes Hernia repair; Esophagogastroduodenoscopy (N/A, 01/04/2022); and Colonoscopy with propofol (N/A, 01/04/2022). Randy [name] has a current medication list which includes the following prescription(s): accu-chek aviva plus, albuterol, allopurinol, aspirin, atorvastatin, bd insulin syringe u/f, cyanocobalamin, ferrous sulfate, gabapentin, insulin glargine, insulin lispro, jardiance, lidocaine, lisinopril, omeprazole, oxycodone-acetaminophen, and tamsulosin. His  primarily concern today is the Foot Pain (left)  Initial Vital Signs:  Pulse/HCG Rate: 91  Temp: (!) 97.4 F (36.3 C) Resp: 16 BP: 129/69 SpO2: 98 %  BMI: Estimated body mass index is 41.66 kg/m as calculated from the following:   Height as of this encounter: 5\' 8"  (1.727 m).   Weight as of this encounter: 274 lb (124.3 kg).  Risk Assessment: Allergies: Reviewed. He has no known allergies.  Allergy Precautions: None required Coagulopathies: Reviewed. None identified.  Blood-thinner therapy: None at this time Active Infection(s): Reviewed. None identified. Randy [name] is afebrile  Site Confirmation: Randy [name] was asked to confirm the procedure and laterality before marking the site Procedure checklist: Completed Consent: Before the procedure and under the influence of no sedative(s), amnesic(s), or anxiolytics, the patient was informed of the treatment options, risks and possible complications. To fulfill our ethical and legal obligations, as recommended by the American Medical Association's Code of Ethics, I have informed the patient of my clinical impression; the nature and purpose of the treatment or procedure; the risks, benefits, and possible complications of the intervention; the alternatives, including doing nothing; the risk(s) and benefit(s) of the alternative treatment(s) or procedure(s); and the risk(s) and benefit(s) of doing nothing. The patient was provided information about the general risks and possible complications associated with the procedure. These may include, but are not limited to: failure to achieve desired goals, infection, bleeding, organ or nerve damage, allergic reactions, paralysis, and death. In addition, the patient was informed of those risks and complications associated to the procedure, such as failure to decrease pain; infection; bleeding; organ or nerve damage with subsequent damage to sensory, motor, and/or autonomic systems, resulting in permanent pain,  numbness, and/or weakness of one or several areas of the body; allergic reactions; (i.e.: anaphylactic reaction); and/or death. Furthermore, the patient was informed of those risks  and complications associated with the medications. These include, but are not limited to: allergic reactions (i.e.: anaphylactic or anaphylactoid reaction(s)); adrenal axis suppression; blood sugar elevation that in diabetics may result in ketoacidosis or comma; water retention that in patients with history of congestive heart failure may result in shortness of breath, pulmonary edema, and decompensation with resultant heart failure; weight gain; swelling or edema; medication-induced neural toxicity; particulate matter embolism and blood vessel occlusion with resultant organ, and/or nervous system infarction; and/or aseptic necrosis of one or more joints. Finally, the patient was informed that Medicine is not an exact science; therefore, there is also the possibility of unforeseen or unpredictable risks and/or possible complications that may result in a catastrophic outcome. The patient indicated having understood very clearly. We have given the patient no guarantees and we have made no promises. Enough time was given to the patient to ask questions, all of which were answered to the patient's satisfaction. Randy [name] has indicated that he wanted to continue with the procedure. Attestation: I, the ordering provider, attest that I have discussed with the patient the benefits, risks, side-effects, alternatives, likelihood of achieving goals, and potential problems during recovery for the procedure that I have provided informed consent. Date  Time: 06/07/2023 12:57 PM  Pre-Procedure Preparation:  Monitoring: As per clinic protocol. Respiration, ETCO2, SpO2, BP, heart rate and rhythm monitor placed and checked for adequate function Safety Precautions: Patient was assessed for positional comfort and pressure points before starting the  procedure. Time-out: I initiated and conducted the "Time-out" before starting the procedure, as per protocol. The patient was asked to participate by confirming the accuracy of the "Time Out" information. Verification of the correct person, site, and procedure were performed and confirmed by me, the nursing staff, and the patient. "Time-out" conducted as per Joint Commission's Universal Protocol (UP.01.01.01). Time: 1417 Start Time: 1417 hrs.  Description of Procedure:          Approach: Dorsal approach. Area Prepped: Entire dorsal foot Region ChloraPrep (2% chlorhexidine gluconate and 70% isopropyl alcohol)   The patient was placed in the supine position with the left leg externally rotated. Standard timeout was performed.  The lateral aspect of the left lower leg (mid to distal third) was prepped with chlorhexidine and draped in a sterile fashion.  Using a high-frequency linear ultrasound probe, the superficial peroneal nerve was identified in the subcutaneous tissue between the peroneus muscles and the extensor digitorum longus muscle, just above where it emerges through the fascia into the subcutaneous plane.  A 25-gauge, pajunk needle was inserted in-plane under ultrasound guidance and advanced toward the nerve in a lateral-to-medial approach. After negative aspiration, the following injectate was administered:  5 mL of 0.2% ropivacaine + 2 mL of dexamethasone (10 mg/cc)   The spread of the anesthetic was visualized around the nerve. The needle was withdrawn, and hemostasis was achieved.                                   Vitals:   06/07/23 1326 06/07/23 1433  BP: 129/69 119/60  Pulse: 91 86  Resp: 16 16  Temp: (!) 97.4 F (36.3 C)   SpO2: 98% 98%  Weight: 274 lb (124.3 kg)   Height: 5\' 8"  (1.727 m)     Start Time: 1417 hrs. End Time: 1430 hrs.   Type of Imaging Technique: Ultrasound Guidance Ultrasound Guidance: Permanently recorded images stored by scanning  into EMR.    Post-operative Assessment:  Post-procedure Vital Signs:  Pulse/HCG Rate: 86  Temp: (!) 97.4 F (36.3 C) Resp: 16 BP: 119/60 SpO2: 98 %  EBL: None  Complications: No immediate post-treatment complications observed by team, or reported by patient.  Note: The patient tolerated the entire procedure well. A repeat set of vitals were taken after the procedure and the patient was kept under observation following institutional policy, for this type of procedure. Post-procedural neurological assessment was performed, showing return to baseline, prior to discharge. The patient was provided with post-procedure discharge instructions, including a section on how to identify potential problems. Should any problems arise concerning this procedure, the patient was given instructions to immediately contact us, at any time, without hesitation. In any case, we plan to contact the patient by telephone for a follow-up status report regarding this interventional procedure.  Comments:  No additional relevant information.  Plan of Care (POC)     Medications ordered for procedure: Meds ordered this encounter  Medications   lidocaine (XYLOCAINE) 2 % (with pres) injection 400 mg   dexamethasone (DECADRON) injection 20 mg   ropivacaine (PF) 2 mg/mL (0.2%) (NAROPIN) injection 4 mL   Medications administered: We administered lidocaine, dexamethasone, and ropivacaine (PF) 2 mg/mL (0.2%).  See the medical record for exact dosing, route, and time of administration.  Follow-up plan:   Return in about 4 weeks (around 07/05/2023), or 3 - 4 weeks PPE F2F.       Left superficial peroneal nerve block under ultrasound guidance 06/07/2023    Recent Visits Date Type Provider Dept  05/04/23 Office Visit Edward Jolly, MD [facility]  Showing recent visits within past 90 days and meeting all other requirements Today's Visits Date Type Provider Dept  06/07/23 Procedure visit Edward Jolly, MD  [facility]  Showing today's visits and meeting all other requirements Future Appointments Date Type Provider Dept  07/10/23 Appointment Edward Jolly, MD [facility]  Showing future appointments within next 90 days and meeting all other requirements  Disposition: Discharge home  Discharge (Date  Time): 06/07/2023; 1436 hrs.   Primary Care Physician: [facility] Location: [facility] Note by: Edward Jolly, MD (TTS technology used. I apologize for any typographical errors that were not detected and corrected.) Date: 06/07/2023; Time: 3:56 PM  Disclaimer:  Medicine is not an Visual merchandiser. The only guarantee in medicine is that nothing is guaranteed. It is important to note that the decision to proceed with this intervention was based on the information collected from the patient. The Data and conclusions were drawn from the patient's questionnaire, the interview, and the physical examination. Because the information was provided in large part by the patient, it cannot be guaranteed that it has not been purposely or unconsciously manipulated. Every effort has been made to obtain as much relevant data as possible for this evaluation. It is important to note that the conclusions that lead to this procedure are derived in large part from the available data. Always take into account that the treatment will also be dependent on availability of resources and existing treatment guidelines, considered by other Pain Management Practitioners as being common knowledge and practice, at the time of the intervention. For Medico-Legal purposes, it is also important to point out that variation in procedural techniques and pharmacological choices are the acceptable norm. The indications, contraindications, technique, and results of the above procedure should only be interpreted and judged by a  Board-Certified Interventional Pain Specialist with  extensive familiarity and expertise in the same exact procedure and technique.

## 2023-06-07 NOTE — Progress Notes (Signed)
 Safety precautions to be maintained throughout the outpatient stay will include: orient to surroundings, keep bed in low position, maintain call bell within reach at all times, provide assistance with transfer out of bed and ambulation.

## 2023-06-07 NOTE — Patient Instructions (Signed)

## 2023-06-08 ENCOUNTER — Telehealth: Payer: Self-pay

## 2023-06-08 NOTE — Telephone Encounter (Signed)
 Post procedure follow up.  LM

## 2023-07-10 ENCOUNTER — Ambulatory Visit: Admitting: Student in an Organized Health Care Education/Training Program

## 2023-07-11 ENCOUNTER — Ambulatory Visit
Attending: Student in an Organized Health Care Education/Training Program | Admitting: Student in an Organized Health Care Education/Training Program

## 2023-07-11 ENCOUNTER — Encounter: Payer: Self-pay | Admitting: Student in an Organized Health Care Education/Training Program

## 2023-07-11 VITALS — BP 141/58 | HR 84 | Temp 98.1°F | Ht 68.0 in | Wt 274.0 lb

## 2023-07-11 DIAGNOSIS — G5732 Lesion of lateral popliteal nerve, left lower limb: Secondary | ICD-10-CM | POA: Insufficient documentation

## 2023-07-11 DIAGNOSIS — M79672 Pain in left foot: Secondary | ICD-10-CM | POA: Insufficient documentation

## 2023-07-11 DIAGNOSIS — G5793 Unspecified mononeuropathy of bilateral lower limbs: Secondary | ICD-10-CM | POA: Insufficient documentation

## 2023-07-11 NOTE — Patient Instructions (Signed)
 GENERAL RISKS AND COMPLICATIONS  What are the risk, side effects and possible complications? Generally speaking, most procedures are safe.  However, with any procedure there are risks, side effects, and the possibility of complications.  The risks and complications are dependent upon the sites that are lesioned, or the type of nerve block to be performed.  The closer the procedure is to the spine, the more serious the risks are.  Great care is taken when placing the radio frequency needles, block needles or lesioning probes, but sometimes complications can occur. Infection: Any time there is an injection through the skin, there is a risk of infection.  This is why sterile conditions are used for these blocks.  There are four possible types of infection. Localized skin infection. Central Nervous System Infection-This can be in the form of Meningitis, which can be deadly. Epidural Infections-This can be in the form of an epidural abscess, which can cause pressure inside of the spine, causing compression of the spinal cord with subsequent paralysis. This would require an emergency surgery to decompress, and there are no guarantees that the patient would recover from the paralysis. Discitis-This is an infection of the intervertebral discs.  It occurs in about 1% of discography procedures.  It is difficult to treat and it may lead to surgery.        2. Pain: the needles have to go through skin and soft tissues, will cause soreness.       3. Damage to internal structures:  The nerves to be lesioned may be near blood vessels or    other nerves which can be potentially damaged.       4. Bleeding: Bleeding is more common if the patient is taking blood thinners such as  aspirin, Coumadin, Ticiid, Plavix, etc., or if he/she have some genetic predisposition  such as hemophilia. Bleeding into the spinal canal can cause compression of the spinal  cord with subsequent paralysis.  This would require an emergency  surgery to  decompress and there are no guarantees that the patient would recover from the  paralysis.       5. Pneumothorax:  Puncturing of a lung is a possibility, every time a needle is introduced in  the area of the chest or upper back.  Pneumothorax refers to free air around the  collapsed lung(s), inside of the thoracic cavity (chest cavity).  Another two possible  complications related to a similar event would include: Hemothorax and Chylothorax.   These are variations of the Pneumothorax, where instead of air around the collapsed  lung(s), you may have blood or chyle, respectively.       6. Spinal headaches: They may occur with any procedures in the area of the spine.       7. Persistent CSF (Cerebro-Spinal Fluid) leakage: This is a rare problem, but may occur  with prolonged intrathecal or epidural catheters either due to the formation of a fistulous  track or a dural tear.       8. Nerve damage: By working so close to the spinal cord, there is always a possibility of  nerve damage, which could be as serious as a permanent spinal cord injury with  paralysis.       9. Death:  Although rare, severe deadly allergic reactions known as "Anaphylactic  reaction" can occur to any of the medications used.      10. Worsening of the symptoms:  We can always make thing worse.  What are the chances  of something like this happening? Chances of any of this occuring are extremely low.  By statistics, you have more of a chance of getting killed in a motor vehicle accident: while driving to the hospital than any of the above occurring .  Nevertheless, you should be aware that they are possibilities.  In general, it is similar to taking a shower.  Everybody knows that you can slip, hit your head and get killed.  Does that mean that you should not shower again?  Nevertheless always keep in mind that statistics do not mean anything if you happen to be on the wrong side of them.  Even if a procedure has a 1 (one) in a  1,000,000 (million) chance of going wrong, it you happen to be that one..Also, keep in mind that by statistics, you have more of a chance of having something go wrong when taking medications.  Who should not have this procedure? If you are on a blood thinning medication (e.g. Coumadin, Plavix, see list of "Blood Thinners"), or if you have an active infection going on, you should not have the procedure.  If you are taking any blood thinners, please inform your physician.  How should I prepare for this procedure? Do not eat or drink anything at least six hours prior to the procedure. Bring a driver with you .  It cannot be a taxi. Come accompanied by an adult that can drive you back, and that is strong enough to help you if your legs get weak or numb from the local anesthetic. Take all of your medicines the morning of the procedure with just enough water to swallow them. If you have diabetes, make sure that you are scheduled to have your procedure done first thing in the morning, whenever possible. If you have diabetes, take only half of your insulin dose and notify our nurse that you have done so as soon as you arrive at the clinic. If you are diabetic, but only take blood sugar pills (oral hypoglycemic), then do not take them on the morning of your procedure.  You may take them after you have had the procedure. Do not take aspirin or any aspirin-containing medications, at least eleven (11) days prior to the procedure.  They may prolong bleeding. Wear loose fitting clothing that may be easy to take off and that you would not mind if it got stained with Betadine or blood. Do not wear any jewelry or perfume Remove any nail coloring.  It will interfere with some of our monitoring equipment.  NOTE: Remember that this is not meant to be interpreted as a complete list of all possible complications.  Unforeseen problems may occur.  BLOOD THINNERS The following drugs contain aspirin or other products,  which can cause increased bleeding during surgery and should not be taken for 2 weeks prior to and 1 week after surgery.  If you should need take something for relief of minor pain, you may take acetaminophen which is found in Tylenol,m Datril, Anacin-3 and Panadol. It is not blood thinner. The products listed below are.  Do not take any of the products listed below in addition to any listed on your instruction sheet.  A.P.C or A.P.C with Codeine Codeine Phosphate Capsules #3 Ibuprofen Ridaura  ABC compound Congesprin Imuran rimadil  Advil Cope Indocin Robaxisal  Alka-Seltzer Effervescent Pain Reliever and Antacid Coricidin or Coricidin-D  Indomethacin Rufen  Alka-Seltzer plus Cold Medicine Cosprin Ketoprofen S-A-C Tablets  Anacin Analgesic Tablets or Capsules Coumadin  Korlgesic Salflex  Anacin Extra Strength Analgesic tablets or capsules CP-2 Tablets Lanoril Salicylate  Anaprox Cuprimine Capsules Levenox Salocol  Anexsia-D Dalteparin Magan Salsalate  Anodynos Darvon compound Magnesium Salicylate Sine-off  Ansaid Dasin Capsules Magsal Sodium Salicylate  Anturane Depen Capsules Marnal Soma  APF Arthritis pain formula Dewitt's Pills Measurin Stanback  Argesic Dia-Gesic Meclofenamic Sulfinpyrazone  Arthritis Bayer Timed Release Aspirin Diclofenac Meclomen Sulindac  Arthritis pain formula Anacin Dicumarol Medipren Supac  Analgesic (Safety coated) Arthralgen Diffunasal Mefanamic Suprofen  Arthritis Strength Bufferin Dihydrocodeine Mepro Compound Suprol  Arthropan liquid Dopirydamole Methcarbomol with Aspirin Synalgos  ASA tablets/Enseals Disalcid Micrainin Tagament  Ascriptin Doan's Midol Talwin  Ascriptin A/D Dolene Mobidin Tanderil  Ascriptin Extra Strength Dolobid Moblgesic Ticlid  Ascriptin with Codeine Doloprin or Doloprin with Codeine Momentum Tolectin  Asperbuf Duoprin Mono-gesic Trendar  Aspergum Duradyne Motrin or Motrin IB Triminicin  Aspirin plain, buffered or enteric coated  Durasal Myochrisine Trigesic  Aspirin Suppositories Easprin Nalfon Trillsate  Aspirin with Codeine Ecotrin Regular or Extra Strength Naprosyn Uracel  Atromid-S Efficin Naproxen Ursinus  Auranofin Capsules Elmiron Neocylate Vanquish  Axotal Emagrin Norgesic Verin  Azathioprine Empirin or Empirin with Codeine Normiflo Vitamin E  Azolid Emprazil Nuprin Voltaren  Bayer Aspirin plain, buffered or children's or timed BC Tablets or powders Encaprin Orgaran Warfarin Sodium  Buff-a-Comp Enoxaparin Orudis Zorpin  Buff-a-Comp with Codeine Equegesic Os-Cal-Gesic   Buffaprin Excedrin plain, buffered or Extra Strength Oxalid   Bufferin Arthritis Strength Feldene Oxphenbutazone   Bufferin plain or Extra Strength Feldene Capsules Oxycodone with Aspirin   Bufferin with Codeine Fenoprofen Fenoprofen Pabalate or Pabalate-SF   Buffets II Flogesic Panagesic   Buffinol plain or Extra Strength Florinal or Florinal with Codeine Panwarfarin   Buf-Tabs Flurbiprofen Penicillamine   Butalbital Compound Four-way cold tablets Penicillin   Butazolidin Fragmin Pepto-Bismol   Carbenicillin Geminisyn Percodan   Carna Arthritis Reliever Geopen Persantine   Carprofen Gold's salt Persistin   Chloramphenicol Goody's Phenylbutazone   Chloromycetin Haltrain Piroxlcam   Clmetidine heparin Plaquenil   Cllnoril Hyco-pap Ponstel   Clofibrate Hydroxy chloroquine Propoxyphen         Before stopping any of these medications, be sure to consult the physician who ordered them.  Some, such as Coumadin (Warfarin) are ordered to prevent or treat serious conditions such as "deep thrombosis", "pumonary embolisms", and other heart problems.  The amount of time that you may need off of the medication may also vary with the medication and the reason for which you were taking it.  If you are taking any of these medications, please make sure you notify your pain physician before you undergo any procedures.         Selective Nerve Root  Block Patient Information  Description: Specific nerve roots exit the spinal canal and these nerves can be compressed and inflamed by a bulging disc and bone spurs.  By injecting steroids on the nerve root, we can potentially decrease the inflammation surrounding these nerves, which often leads to decreased pain.  Also, by injecting local anesthesia on the nerve root, this can provide [place] helpful information to give to your referring doctor if it decreases your pain.  Selective nerve root blocks can be done along the spine from the neck to the low back depending on the location of your pain.   After numbing the skin with local anesthesia, a small needle is passed to the nerve root and the position of the needle is verified using x-ray pictures.  After the needle is  in correct position, we then deposit the medication.  You may experience a pressure sensation while this is being done.  The entire block usually lasts less than 15 minutes.  Conditions that may be treated with selective nerve root blocks: Low back and leg pain Spinal stenosis Diagnostic block prior to potential surgery Neck and arm pain Post laminectomy syndrome  Preparation for the injection:  Do not eat any solid food or dairy products within 8 hours of your appointment. You may drink clear liquids up to 3 hours before an appointment.  Clear liquids include water, black coffee, juice or soda.  No milk or cream please. You may take your regular medications, including pain medications, with a sip of water before your appointment.  Diabetics should hold regular insulin (if taken separately) and take 1/2 normal NPH dose the morning of the procedure.  Carry some sugar containing items with you to your appointment. A driver must accompany you and be prepared to drive you home after your procedure. Bring all your current medications with you. An IV may be inserted and sedation may be given at the discretion of the physician. A blood  pressure cuff, EKG, and other monitors will often be applied during the procedure.  Some patients may need to have extra oxygen administered for a short period. You will be asked to provide medical information, including allergies, prior to the procedure.  We must know immediately if you are taking blood  Thinners (like Coumadin) or if you are allergic to IV iodine contrast (dye).  Possible side-effects: All are usually temporary Bleeding from needle site Light headedness Numbness and tingling Decreased blood pressure Weakness in arms/legs Pressure sensation in back/neck Pain at injection site (several days)  Possible complications: All are extremely rare Infection Nerve injury Spinal headache (a headache wore with upright position)  Call if you experience: Fever/chills associated with headache or increased back/neck pain Headache worsened by an upright position New onset weakness or numbness of an extremity below the injection site Hives or difficulty breathing (go to the emergency room) Inflammation or drainage at the injection site(s) Severe back/neck pain greater than usual New symptoms which are concerning to you  Please note:  Although the local anesthetic injected can often make your back or neck feel good for several hours after the injection the pain will likely return.  It takes 3-5 days for steroids to work on the nerve root. You may not notice any pain relief for at least one week.  If effective, we will often do a series of 3 injections spaced 3-6 weeks apart to maximally decrease your pain.    If you have any questions, please call [phone] [facility]

## 2023-07-11 NOTE — Progress Notes (Signed)
 PROVIDER NOTE: Interpretation of information contained herein should be left to medically-trained personnel. Specific patient instructions are provided elsewhere under "Patient Instructions" section of medical record. This document was created in part using AI and STT-dictation technology, any transcriptional errors that may result from this process are unintentional.  Patient: Randy [name]  Service: E/M   PCP: [facility]  DOB: Mar 27, 1950  DOS: 07/11/2023  Provider: Cephus Collin, MD  MRN: [id number]  Delivery: Face-to-face  Specialty: Interventional Pain Management  Type: Established Patient  Setting: Ambulatory outpatient facility  Specialty designation: 09  Referring Prov.: [facility]  Location: Outpatient office facility       HPI  Mr. Randy [name], a 73 y.o. 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 Randy [name]'s primary complain today is Foot Pain (left)  Pain Assessment: Severity of Chronic pain is reported as a 7 /10. Location: Foot Left/Denies. Onset: More than a month ago. Quality: Aching, Burning, Throbbing, Pressure. Timing: Constant. Modifying factor(s): sit down. Vitals:  height is 5\' 8"  (1.727 m) and weight is 274 lb (124.3 kg). His temperature is 98.1 F (36.7 C). His blood pressure is 141/58 (abnormal) and his pulse is 84. His oxygen saturation is 98%.  BMI: Estimated body mass index is 41.66 kg/m as calculated from the following:   Height as of this encounter: 5\' 8"  (1.727 m).   Weight as of this encounter: 274 lb (124.3 kg). Last encounter: 05/04/2023. Last procedure: 06/07/2023.  Reason for encounter: post-procedure evaluation and assessment.   History of Present Illness   Randy [name] is a 73 year old male who presents with persistent foot pain following a previous nerve block.  He experiences pain primarily in his toe and on the top of his foot, which worsens with  weight-bearing activities. The pain is significant enough to limit wearing a shoe to three to four hours before needing to remove it due to discomfort.  He previously underwent a nerve block with sedation, which provided some relief, but the pain persists.         Procedure:          Anesthesia, Analgesia, Anxiolysis:  Left superficial peroneal nerve block at left lateral ankle under ultrasound guidance  Type: Local Anesthesia Local Anesthetic: Lidocaine  1-2% Sedation: None  Indication(s):  Analgesia Route: Infiltration ([place]/IM) IV Access: N/A   Position: Supine   1. Superficial peroneal nerve neuropathy, left   2. Left foot pain   3. Neuropathic pain of both feet    NAS-11 Pain score:   Pre-procedure: 10-Worst pain ever/10   Post-procedure: 2/10    Effectiveness:  Initial hour after procedure: 100 %  Subsequent 4-6 hours post-procedure: 80 %  Analgesia past initial 6 hours: 80 % (lasted for 2 weeks)  Ongoing improvement:  Analgesic:  50% currently Function: Somewhat improved ROM: Somewhat improved   ROS  Constitutional: Denies any fever or chills Gastrointestinal: No reported hemesis, hematochezia, vomiting, or acute GI distress Musculoskeletal: Denies any acute onset joint swelling, redness, loss of ROM, or weakness Neurological: left foot parasthesias  Medication Review  Insulin Syringe-Needle U-100, albuterol , allopurinol, aspirin, atorvastatin, cyanocobalamin , empagliflozin, ferrous sulfate , gabapentin , glucose blood, insulin glargine, insulin lispro, lidocaine , lisinopril, omeprazole , oxyCODONE -acetaminophen , and tamsulosin  History Review  Allergy: Randy [name] has no known allergies. Drug: Randy [name]  reports no history of drug use. Alcohol:  reports current alcohol use. Tobacco:  reports that he quit smoking about  31 years ago. His smoking use included cigarettes. He started smoking about 56 years ago. He has a 25 pack-year smoking history. He has been exposed  to tobacco smoke. He has never used smokeless tobacco. Social: Randy [name]  reports that he quit smoking about 31 years ago. His smoking use included cigarettes. He started smoking about 56 years ago. He has a 25 pack-year smoking history. He has been exposed to tobacco smoke. He has never used smokeless tobacco. He reports current alcohol use. He reports that he does not use drugs. Medical:  has a past medical history of Diabetes mellitus without complication (HCC), Hypercholesteremia, Hypertension, and Sleep apnea. Surgical: Randy [name]  has a past surgical history that includes Hernia repair; Esophagogastroduodenoscopy (N/A, 01/04/2022); and Colonoscopy with propofol  (N/A, 01/04/2022). Family: family history is not on file.  Laboratory Chemistry Profile   Renal Lab Results  Component Value Date   BUN 12 03/28/2023   CREATININE 0.81 03/28/2023   GFRAA >60 03/09/2018   GFRNONAA >60 03/28/2023    Hepatic Lab Results  Component Value Date   AST 19 03/23/2022   ALT 20 03/23/2022   ALBUMIN 3.9 03/23/2022   ALKPHOS 77 03/23/2022   LIPASE 163 (H) 11/11/2020    Electrolytes Lab Results  Component Value Date   NA 132 (L) 03/28/2023   K 3.8 03/28/2023   CL 98 03/28/2023   CALCIUM 8.7 (L) 03/28/2023    Bone No results found for: "VD25OH", "VD125OH2TOT", "ZO1096EA5", "WU9811BJ4", "25OHVITD1", "25OHVITD2", "25OHVITD3", "TESTOFREE", "TESTOSTERONE"  Inflammation (CRP: Acute Phase) (ESR: Chronic Phase) No results found for: "CRP", "ESRSEDRATE", "LATICACIDVEN"       Note: Above Lab results reviewed.  Recent Imaging Review  VAS US  LOWER EXTREMITY VENOUS REFLUX  Lower Venous Reflux Study  Patient Name:  Randy [name]  Date of Exam:   10/24/2022 Medical Rec #: [id number]         Accession #:    [id number] Date of Birth: 01/07/1951         Patient Gender: M Patient Age:   49 years Exam Location:  [facility] Procedure:      VAS US  LOWER EXTREMITY VENOUS REFLUX Referring Phys:  Sharla Davis  --------------------------------------------------------------------------------   Indications: Pain, Swelling, and Edema.   Performing Technologist: Oneta Bilberry RVT    Examination Guidelines: A complete evaluation includes B-mode imaging, spectral Doppler, color Doppler, and power Doppler as needed of all accessible portions of each vessel. Bilateral testing is considered an integral part of a complete examination. Limited examinations for reoccurring indications may be performed as noted. The reflux portion of the exam is performed with the patient in reverse Trendelenburg. Significant venous reflux is defined as >500 ms in the superficial venous system, and >1 second in the deep venous system.    +--------------+---------+------+-----------+------------+--------+ LEFT          Reflux NoRefluxReflux TimeDiameter cmsComments                         Yes                                  +--------------+---------+------+-----------+------------+--------+ CFV           no                                             +--------------+---------+------+-----------+------------+--------+  FV prox       no                                             +--------------+---------+------+-----------+------------+--------+ FV mid        no                                             +--------------+---------+------+-----------+------------+--------+ FV dist       no                                             +--------------+---------+------+-----------+------------+--------+ Popliteal     no                                             +--------------+---------+------+-----------+------------+--------+ GSV at [facility]    no                            0.86             +--------------+---------+------+-----------+------------+--------+ GSV prox thighno                            0.71              +--------------+---------+------+-----------+------------+--------+ GSV mid thigh no                            0.62             +--------------+---------+------+-----------+------------+--------+ GSV dist thighno                            0.53             +--------------+---------+------+-----------+------------+--------+ GSV at knee   no                            0.58             +--------------+---------+------+-----------+------------+--------+ GSV prox calf no                            0.55             +--------------+---------+------+-----------+------------+--------+ SSV Pop Fossa no                            0.31             +--------------+---------+------+-----------+------------+--------+ SSV prox calf no                            0.31             +--------------+---------+------+-----------+------------+--------+ SSV mid calf  no  0.30             +--------------+---------+------+-----------+------------+--------+       Summary: Left: - No evidence of deep vein thrombosis seen in the left lower extremity, from the common femoral through the popliteal veins. - No evidence of superficial venous thrombosis in the left lower extremity. - There is no evidence of venous reflux seen in the left lower extremity. - No evidence of superficial venous reflux seen in the left greater saphenous vein. - No evidence of superficial venous reflux seen in the left short saphenous vein.   *See table(s) above for measurements and observations.  Electronically signed by Devon Fogo MD on 11/03/2022 at 8:30:21 AM.      Final   Note: Reviewed         Physical Exam  General appearance: Well nourished, well developed, and well hydrated. In no apparent acute distress Mental status: Alert, oriented x 3 (person, place, & time)       Respiratory: No evidence of acute respiratory distress Eyes: PERLA Vitals: BP  (!) 141/58   Pulse 84   Temp 98.1 F (36.7 C)   Ht 5\' 8"  (1.727 m)   Wt 274 lb (124.3 kg)   SpO2 98%   BMI 41.66 kg/m  BMI: Estimated body mass index is 41.66 kg/m as calculated from the following:   Height as of this encounter: 5\' 8"  (1.727 m).   Weight as of this encounter: 274 lb (124.3 kg). Ideal: Ideal body weight: 68.4 kg (150 lb 12.7 oz) Adjusted ideal body weight: 90.8 kg (200 lb 1.2 oz) Neuropathic pain of left foot in distribution of left peroneal nerve  Assessment   Diagnosis  1. Superficial peroneal nerve neuropathy, left   2. Left foot pain   3. Neuropathic pain of both feet      Updated Problems: No problems updated.  Plan of Care  Problem-specific:  Assessment and Plan    Foot pain   Chronic foot pain in the toe and dorsum worsens with weight-bearing activities. Previous nerve block provided partial relief. Current management includes nerve blocks every three months to alleviate pain and enhance function. Schedule follow-up for repeat nerve block in early July, three months after the last injection.       1. Superficial peroneal nerve neuropathy, left (Primary) - Misc procedure; Future  2. Left foot pain - Misc procedure; Future  3. Neuropathic pain of both feet - Misc procedure; Future    Pharmacotherapy (Medications Ordered): No orders of the defined types were placed in this encounter.  Orders:  Orders Placed This Encounter  Procedures   Misc procedure    Standing Status:   Future    Expected Date:   09/06/2023    Expiration Date:   10/11/2023    Scheduling Instructions:     LEFT superficial peroneal nerve block under ultrasound guidance #2   Follow-up plan:   Return in about 8 weeks (around 09/06/2023) for LEFT superficial peroneal nerve block under ultrasound guidance #2, in clinic [facility].     Left superficial peroneal nerve block under ultrasound guidance 06/07/2023    Recent Visits Date Type Provider Dept  06/07/23 Procedure visit Cephus Collin, MD [facility]  05/04/23 Office Visit Cephus Collin, MD [facility]  Showing recent visits within past 90 days and meeting all other requirements Today's Visits Date Type Provider Dept  07/11/23 Office Visit Cephus Collin, MD [facility]  Showing today's visits and meeting all other requirements  Future Appointments Date Type Provider Dept  09/06/23 Appointment Cephus Collin, MD [facility]  Showing future appointments within next 90 days and meeting all other requirements   I discussed the assessment and treatment plan with the patient. The patient was provided an opportunity to ask questions and all were answered. The patient agreed with the plan and demonstrated an understanding of the instructions.  Patient advised to call back or seek an in-person evaluation if the symptoms or condition worsens.  Duration of encounter: .  Total time on encounter, as per AMA guidelines included both the face-to-face and non-face-to-face time personally spent by the physician and/or other qualified health care professional(s) on the day of the encounter (includes time in activities that require the physician or other qualified health care professional and does not include time in activities normally performed by clinical staff). Physician's time may include the following activities when performed: Preparing to see the patient (e.g., pre-charting review of records, searching for previously ordered imaging, lab work, and nerve conduction tests) Review of prior analgesic pharmacotherapies. Reviewing PMP Interpreting ordered tests (e.g., lab work, imaging, nerve conduction tests) Performing post-procedure evaluations, including interpretation of diagnostic procedures Obtaining and/or reviewing separately obtained history Performing a medically appropriate examination and/or evaluation Counseling and educating the patient/family/caregiver Ordering  medications, tests, or procedures Referring and communicating with other health care professionals (when not separately reported) Documenting clinical information in the electronic or other health record Independently interpreting results (not separately reported) and communicating results to the patient/ family/caregiver Care coordination (not separately reported)  Note by: Cephus Collin, MD (TTS and AI technology used. I apologize for any typographical errors that were not detected and corrected.) Date: 07/11/2023; Time: 11:04 AM

## 2023-07-11 NOTE — Progress Notes (Signed)
 Safety precautions to be maintained throughout the outpatient stay will include: orient to surroundings, keep bed in low position, maintain call bell within reach at all times, provide assistance with transfer out of bed and ambulation.

## 2023-09-06 ENCOUNTER — Encounter: Payer: Self-pay | Admitting: Student in an Organized Health Care Education/Training Program

## 2023-09-06 ENCOUNTER — Ambulatory Visit
Attending: Student in an Organized Health Care Education/Training Program | Admitting: Student in an Organized Health Care Education/Training Program

## 2023-09-06 VITALS — BP 146/64 | HR 78 | Temp 97.9°F | Resp 16 | Ht 68.0 in | Wt 274.0 lb

## 2023-09-06 DIAGNOSIS — G5732 Lesion of lateral popliteal nerve, left lower limb: Secondary | ICD-10-CM | POA: Insufficient documentation

## 2023-09-06 DIAGNOSIS — E1140 Type 2 diabetes mellitus with diabetic neuropathy, unspecified: Secondary | ICD-10-CM | POA: Insufficient documentation

## 2023-09-06 DIAGNOSIS — G5793 Unspecified mononeuropathy of bilateral lower limbs: Secondary | ICD-10-CM | POA: Diagnosis not present

## 2023-09-06 DIAGNOSIS — Z794 Long term (current) use of insulin: Secondary | ICD-10-CM | POA: Insufficient documentation

## 2023-09-06 DIAGNOSIS — M79672 Pain in left foot: Secondary | ICD-10-CM | POA: Diagnosis not present

## 2023-09-06 DIAGNOSIS — Z79899 Other long term (current) drug therapy: Secondary | ICD-10-CM | POA: Diagnosis not present

## 2023-09-06 DIAGNOSIS — Z7984 Long term (current) use of oral hypoglycemic drugs: Secondary | ICD-10-CM | POA: Insufficient documentation

## 2023-09-06 DIAGNOSIS — Z7982 Long term (current) use of aspirin: Secondary | ICD-10-CM | POA: Diagnosis not present

## 2023-09-06 MED ORDER — LIDOCAINE HCL 2 % IJ SOLN
INTRAMUSCULAR | Status: AC
Start: 2023-09-06 — End: 2023-09-06
  Filled 2023-09-06: qty 20

## 2023-09-06 MED ORDER — DEXAMETHASONE SODIUM PHOSPHATE 10 MG/ML IJ SOLN
INTRAMUSCULAR | Status: AC
  Filled 2023-09-06: qty 1

## 2023-09-06 MED ORDER — DEXAMETHASONE SODIUM PHOSPHATE 10 MG/ML IJ SOLN
10.0000 mg | Freq: Once | INTRAMUSCULAR | Status: AC
  Administered 2023-09-06: 10 mg

## 2023-09-06 MED ORDER — LIDOCAINE HCL 2 % IJ SOLN
20.0000 mL | Freq: Once | INTRAMUSCULAR | Status: AC
  Administered 2023-09-06: 400 mg

## 2023-09-06 NOTE — Progress Notes (Signed)
 Safety precautions to be maintained throughout the outpatient stay will include: orient to surroundings, keep bed in low position, maintain call bell within reach at all times, provide assistance with transfer out of bed and ambulation.

## 2023-09-06 NOTE — Patient Instructions (Signed)

## 2023-09-06 NOTE — Progress Notes (Signed)
 PROVIDER NOTE: Interpretation of information contained herein should be left to medically-trained personnel. Specific patient instructions are provided elsewhere under Patient Instructions section of medical record. This document was created in part using STT-dictation technology, any transcriptional errors that may result from this process are unintentional.  Patient: Randy [name] Type: Established DOB: 1950-08-08 MRN: [id number] PCP: [facility]  Service: Procedure DOS: 09/06/2023 Setting: Ambulatory Location: Ambulatory outpatient facility Delivery: Face-to-face Provider: Wallie Sherry, MD Specialty: Interventional Pain Management Specialty designation: 09 Location: Outpatient facility Ref. Prov.: [facility]       Interventional Therapy     Procedure:          Anesthesia, Analgesia, Anxiolysis:  Left superficial peroneal nerve block at left lateral ankle under ultrasound guidance  Type: Local Anesthesia Local Anesthetic: Lidocaine  1-2% Sedation: None  Indication(s):  Analgesia Route: Infiltration ([place]/IM) IV Access: N/A   Position: Supine   1. Chronic painful diabetic neuropathy (HCC)   2. Superficial peroneal nerve neuropathy, left   3. Left foot pain   4. Neuropathic pain of both feet     NAS-11 Pain score:   Pre-procedure: 9 /10   Post-procedure: 5/10     H&P (Pre-op Assessment):  Randy [name] is a 73 y.o. (year old), male patient, seen today for interventional treatment. He  has a past surgical history that includes Hernia repair; Esophagogastroduodenoscopy (N/A, 01/04/2022); and Colonoscopy with propofol  (N/A, 01/04/2022). Mr. Plain has a current medication list which includes the following prescription(s): accu-chek aviva plus, albuterol , allopurinol, aspirin, atorvastatin, bd insulin syringe u/f, cyanocobalamin , ferrous sulfate , gabapentin , insulin glargine, insulin lispro, jardiance, lidocaine , lisinopril, omeprazole ,  oxycodone -acetaminophen , and tamsulosin. His primarily concern today is the Foot Pain  Initial Vital Signs:  Pulse/HCG Rate: 78ECG Heart Rate: 73 Temp: 97.9 F (36.6 C) Resp: 18 BP: (!) 152/70 SpO2: 96 %  BMI: Estimated body mass index is 41.66 kg/m as calculated from the following:   Height as of this encounter: 5' 8 (1.727 m).   Weight as of this encounter: 274 lb (124.3 kg).  Risk Assessment: Allergies: Reviewed. He has no known allergies.  Allergy Precautions: None required Coagulopathies: Reviewed. None identified.  Blood-thinner therapy: None at this time Active Infection(s): Reviewed. None identified. Randy [name] is afebrile  Site Confirmation: Randy [name] was asked to confirm the procedure and laterality before marking the site Procedure checklist: Completed Consent: Before the procedure and under the influence of no sedative(s), amnesic(s), or anxiolytics, the patient was informed of the treatment options, risks and possible complications. To fulfill our ethical and legal obligations, as recommended by the American Medical Association's Code of Ethics, I have informed the patient of my clinical impression; the nature and purpose of the treatment or procedure; the risks, benefits, and possible complications of the intervention; the alternatives, including doing nothing; the risk(s) and benefit(s) of the alternative treatment(s) or procedure(s); and the risk(s) and benefit(s) of doing nothing. The patient was provided information about the general risks and possible complications associated with the procedure. These may include, but are not limited to: failure to achieve desired goals, infection, bleeding, organ or nerve damage, allergic reactions, paralysis, and death. In addition, the patient was informed of those risks and complications associated to the procedure, such as failure to decrease pain; infection; bleeding; organ or nerve damage with subsequent damage to sensory, motor,  and/or autonomic systems, resulting in permanent pain, numbness, and/or weakness of one or several areas of the body; allergic reactions; (i.e.: anaphylactic reaction); and/or  death. Furthermore, the patient was informed of those risks and complications associated with the medications. These include, but are not limited to: allergic reactions (i.e.: anaphylactic or anaphylactoid reaction(s)); adrenal axis suppression; blood sugar elevation that in diabetics may result in ketoacidosis or comma; water retention that in patients with history of congestive heart failure may result in shortness of breath, pulmonary edema, and decompensation with resultant heart failure; weight gain; swelling or edema; medication-induced neural toxicity; particulate matter embolism and blood vessel occlusion with resultant organ, and/or nervous system infarction; and/or aseptic necrosis of one or more joints. Finally, the patient was informed that Medicine is not an exact science; therefore, there is also the possibility of unforeseen or unpredictable risks and/or possible complications that may result in a catastrophic outcome. The patient indicated having understood very clearly. We have given the patient no guarantees and we have made no promises. Enough time was given to the patient to ask questions, all of which were answered to the patient's satisfaction. Randy [name] has indicated that he wanted to continue with the procedure. Attestation: I, the ordering provider, attest that I have discussed with the patient the benefits, risks, side-effects, alternatives, likelihood of achieving goals, and potential problems during recovery for the procedure that I have provided informed consent. Date  Time: 09/06/2023  9:12 AM  Pre-Procedure Preparation:  Monitoring: As per clinic protocol. Respiration, ETCO2, SpO2, BP, heart rate and rhythm monitor placed and checked for adequate function Safety Precautions: Patient was assessed for  positional comfort and pressure points before starting the procedure. Time-out: I initiated and conducted the Time-out before starting the procedure, as per protocol. The patient was asked to participate by confirming the accuracy of the Time Out information. Verification of the correct person, site, and procedure were performed and confirmed by me, the nursing staff, and the patient. Time-out conducted as per Joint Commission's Universal Protocol (UP.01.01.01). Time: 1012 Start Time: 1012 hrs.  Description of Procedure:          Approach: Dorsal approach. Area Prepped: Entire dorsal foot Region ChloraPrep (2% chlorhexidine gluconate and 70% isopropyl alcohol)   The patient was placed in the supine position with the left leg externally rotated. Standard timeout was performed.  The lateral aspect of the left lower leg (mid to distal third) was prepped with chlorhexidine and draped in a sterile fashion.  Using a high-frequency linear ultrasound probe, the superficial peroneal nerve was identified in the subcutaneous tissue between the peroneus muscles and the extensor digitorum longus muscle, just above where it emerges through the fascia into the subcutaneous plane.  A 25-gauge, pajunk needle was inserted in-plane under ultrasound guidance and advanced toward the nerve in a lateral-to-medial approach. After negative aspiration, the following injectate was administered:  8mL of 0.2% ropivacaine  + 1 mL of dexamethasone  (10 mg/cc)   The spread of the anesthetic was visualized around the nerve. The needle was withdrawn, and hemostasis was achieved.                                   Vitals:   09/06/23 0916 09/06/23 1010 09/06/23 1018  BP: (!) 152/70 (!) 146/66 (!) 146/64  Pulse: 78    Resp:  18 16  Temp: 97.9 F (36.6 C)    SpO2: 96% 99% 98%  Weight: 274 lb (124.3 kg)    Height: 5' 8 (1.727 m)      Start Time: 1012 hrs. End  Time: 1018 hrs.   Type of Imaging  Technique: Ultrasound Guidance Ultrasound Guidance: Permanently recorded images stored by scanning into EMR.    Post-operative Assessment:  Post-procedure Vital Signs:  Pulse/HCG Rate: 7874 Temp: 97.9 F (36.6 C) Resp: 16 BP: (!) 146/64 SpO2: 98 %  EBL: None  Complications: No immediate post-treatment complications observed by team, or reported by patient.  Note: The patient tolerated the entire procedure well. A repeat set of vitals were taken after the procedure and the patient was kept under observation following institutional policy, for this type of procedure. Post-procedural neurological assessment was performed, showing return to baseline, prior to discharge. The patient was provided with post-procedure discharge instructions, including a section on how to identify potential problems. Should any problems arise concerning this procedure, the patient was given instructions to immediately contact us , at any time, without hesitation. In any case, we plan to contact the patient by telephone for a follow-up status report regarding this interventional procedure.  Comments:  No additional relevant information.  Plan of Care (POC)     Medications ordered for procedure: Meds ordered this encounter  Medications   lidocaine  (XYLOCAINE ) 2 % (with pres) injection 400 mg   dexamethasone  (DECADRON ) injection 10 mg   Medications administered: We administered lidocaine  and dexamethasone .  See the medical record for exact dosing, route, and time of administration.  Follow-up plan:   Return in about 6 weeks (around 10/18/2023) for [place]  (& PPE).       Left superficial peroneal nerve block under ultrasound guidance 06/07/2023, 09/06/23    Recent Visits Date Type Provider Dept  07/11/23 Office Visit Marcelino Nurse, MD [facility]  Showing recent visits within past 90 days and meeting all other requirements Today's Visits Date Type Provider Dept  09/06/23 Procedure visit  Marcelino Nurse, MD [facility]  Showing today's visits and meeting all other requirements Future Appointments Date Type Provider Dept  10/18/23 Appointment Marcelino Nurse, MD [facility]  Showing future appointments within next 90 days and meeting all other requirements  Disposition: Discharge home  Discharge (Date  Time): 09/06/2023; 1030 hrs.   Primary Care Physician: [facility] Location: [facility] Outpatient [facility] Management Facility Note by: Nurse Marcelino, MD (TTS technology used. I apologize for any typographical errors that were not detected and corrected.) Date: 09/06/2023; Time: 10:25 AM  Disclaimer:  Medicine is not an Visual merchandiser. The only guarantee in medicine is that nothing is guaranteed. It is important to note that the decision to proceed with this intervention was based on the information collected from the patient. The Data and conclusions were drawn from the patient's questionnaire, the interview, and the physical examination. Because the information was provided in large part by the patient, it cannot be guaranteed that it has not been purposely or unconsciously manipulated. Every effort has been made to obtain as much relevant data as possible for this evaluation. It is important to note that the conclusions that lead to this procedure are derived in large part from the available data. Always take into account that the treatment will also be dependent on availability of resources and existing treatment guidelines, considered by other Pain Management Practitioners as being common knowledge and practice, at the time of the intervention. For Medico-Legal purposes, it is also important to point out that variation in procedural techniques and pharmacological choices are the acceptable norm. The indications, contraindications, technique, and results of the above procedure should only be interpreted and judged by  a Board-Certified Interventional Pain  Specialist with extensive familiarity and expertise in the same exact procedure and technique.

## 2023-09-07 ENCOUNTER — Telehealth: Payer: Self-pay | Admitting: Student in an Organized Health Care Education/Training Program

## 2023-09-07 ENCOUNTER — Telehealth: Payer: Self-pay

## 2023-09-07 NOTE — Telephone Encounter (Signed)
Called PP. No answer, left message to call if needed.

## 2023-09-07 NOTE — Telephone Encounter (Signed)
 PT called back stated that he is doing well today. FYI

## 2023-09-30 ENCOUNTER — Other Ambulatory Visit: Payer: Self-pay | Admitting: *Deleted

## 2023-09-30 DIAGNOSIS — E538 Deficiency of other specified B group vitamins: Secondary | ICD-10-CM

## 2023-09-30 DIAGNOSIS — D509 Iron deficiency anemia, unspecified: Secondary | ICD-10-CM

## 2023-10-03 ENCOUNTER — Inpatient Hospital Stay (HOSPITAL_BASED_OUTPATIENT_CLINIC_OR_DEPARTMENT_OTHER): Payer: 59 | Admitting: Internal Medicine

## 2023-10-03 ENCOUNTER — Encounter: Payer: Self-pay | Admitting: Internal Medicine

## 2023-10-03 ENCOUNTER — Inpatient Hospital Stay: Payer: 59 | Attending: Internal Medicine

## 2023-10-03 VITALS — BP 131/55 | HR 84 | Temp 97.4°F | Resp 20 | Ht 68.0 in | Wt 274.1 lb

## 2023-10-03 DIAGNOSIS — G4730 Sleep apnea, unspecified: Secondary | ICD-10-CM | POA: Insufficient documentation

## 2023-10-03 DIAGNOSIS — Z87891 Personal history of nicotine dependence: Secondary | ICD-10-CM | POA: Diagnosis not present

## 2023-10-03 DIAGNOSIS — Z79899 Other long term (current) drug therapy: Secondary | ICD-10-CM | POA: Diagnosis not present

## 2023-10-03 DIAGNOSIS — D649 Anemia, unspecified: Secondary | ICD-10-CM | POA: Diagnosis not present

## 2023-10-03 DIAGNOSIS — I10 Essential (primary) hypertension: Secondary | ICD-10-CM | POA: Insufficient documentation

## 2023-10-03 DIAGNOSIS — Z794 Long term (current) use of insulin: Secondary | ICD-10-CM | POA: Diagnosis not present

## 2023-10-03 DIAGNOSIS — E538 Deficiency of other specified B group vitamins: Secondary | ICD-10-CM

## 2023-10-03 DIAGNOSIS — D509 Iron deficiency anemia, unspecified: Secondary | ICD-10-CM | POA: Diagnosis present

## 2023-10-03 DIAGNOSIS — E119 Type 2 diabetes mellitus without complications: Secondary | ICD-10-CM | POA: Diagnosis not present

## 2023-10-03 DIAGNOSIS — E7800 Pure hypercholesterolemia, unspecified: Secondary | ICD-10-CM | POA: Diagnosis not present

## 2023-10-03 LAB — CBC WITH DIFFERENTIAL (CANCER CENTER ONLY)
Abs Immature Granulocytes: 0.03 K/uL (ref 0.00–0.07)
Basophils Absolute: 0 K/uL (ref 0.0–0.1)
Basophils Relative: 1 %
Eosinophils Absolute: 0.4 K/uL (ref 0.0–0.5)
Eosinophils Relative: 5 %
HCT: 35.7 % — ABNORMAL LOW (ref 39.0–52.0)
Hemoglobin: 11.8 g/dL — ABNORMAL LOW (ref 13.0–17.0)
Immature Granulocytes: 0 %
Lymphocytes Relative: 31 %
Lymphs Abs: 2.4 K/uL (ref 0.7–4.0)
MCH: 27.3 pg (ref 26.0–34.0)
MCHC: 33.1 g/dL (ref 30.0–36.0)
MCV: 82.6 fL (ref 80.0–100.0)
Monocytes Absolute: 0.9 K/uL (ref 0.1–1.0)
Monocytes Relative: 12 %
Neutro Abs: 3.8 K/uL (ref 1.7–7.7)
Neutrophils Relative %: 51 %
Platelet Count: 304 K/uL (ref 150–400)
RBC: 4.32 MIL/uL (ref 4.22–5.81)
RDW: 15.6 % — ABNORMAL HIGH (ref 11.5–15.5)
WBC Count: 7.5 K/uL (ref 4.0–10.5)
nRBC: 0 % (ref 0.0–0.2)

## 2023-10-03 LAB — IRON AND TIBC
Iron: 31 ug/dL — ABNORMAL LOW (ref 45–182)
Saturation Ratios: 9 % — ABNORMAL LOW (ref 17.9–39.5)
TIBC: 343 ug/dL (ref 250–450)
UIBC: 312 ug/dL

## 2023-10-03 LAB — FERRITIN: Ferritin: 18 ng/mL — ABNORMAL LOW (ref 24–336)

## 2023-10-03 NOTE — Progress Notes (Signed)
  Cancer Center CONSULT NOTE  Patient Care Team: [facility] as PCP - General (General Practice) Rennie Cindy SAUNDERS, MD as Consulting Physician (Oncology)  CHIEF COMPLAINTS/PURPOSE OF CONSULTATION: anemia  Oncology History   No history exists.    HISTORY OF PRESENTING ILLNESS: Patient ambulating-independently Alone   Randy [name] 73 y.o.  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   Patient states to be taking iron pill for any side effects.  Denies any constipation or diarrhea.  Complains of mild fatigue otherwise no unusual shortness of breath or cough.  Complains of intermittent black-colored stool-but no blood in stool.  Complains of left foot pain chronic.  Review of Systems  Constitutional:  Positive for malaise/fatigue. Negative for chills, diaphoresis, fever and weight loss.  HENT:  Negative for nosebleeds and sore throat.   Eyes:  Negative for double vision.  Respiratory:  Negative for cough, hemoptysis, sputum production, shortness of breath and wheezing.   Cardiovascular:  Negative for chest pain, palpitations, orthopnea and leg swelling.  Gastrointestinal:  Negative for abdominal pain, blood in stool, constipation, diarrhea, heartburn, melena, nausea and vomiting.  Genitourinary:  Negative for dysuria, frequency and urgency.  Musculoskeletal:  Positive for joint pain. Negative for back pain.  Skin: Negative.  Negative for itching and rash.  Neurological:  Negative for dizziness, tingling, focal weakness, weakness and headaches.  Endo/Heme/Allergies:  Does not bruise/bleed easily.  Psychiatric/Behavioral:  Negative for depression. The patient is not nervous/anxious and does not have insomnia.     MEDICAL HISTORY:  Past Medical History:  Diagnosis Date   Diabetes mellitus without complication (HCC)    Hypercholesteremia    Hypertension    Sleep apnea      SURGICAL HISTORY: Past Surgical History:  Procedure Laterality Date   COLONOSCOPY WITH PROPOFOL  N/A 01/04/2022   Procedure: COLONOSCOPY WITH PROPOFOL ;  Surgeon: Therisa Bi, MD;  Location: [facility] ENDOSCOPY;  Service: Gastroenterology;  Laterality: N/A;   ESOPHAGOGASTRODUODENOSCOPY N/A 01/04/2022   Procedure: ESOPHAGOGASTRODUODENOSCOPY (EGD);  Surgeon: Therisa Bi, MD;  Location: [facility] ENDOSCOPY;  Service: Gastroenterology;  Laterality: N/A;   HERNIA REPAIR      SOCIAL HISTORY: Social History   Socioeconomic History   Marital status: Single    Spouse name: Not on file   Number of children: Not on file   Years of education: Not on file   Highest education level: Not on file  Occupational History   Not on file  Tobacco Use   Smoking status: Former    Current packs/day: 0.00    Average packs/day: 1 pack/day for 25.0 years (25.0 ttl pk-yrs)    Types: Cigarettes    Start date: 03/01/1967    Quit date: 02/29/1992    Years since quitting: 31.6    Passive exposure: Past   Smokeless tobacco: Never  Vaping Use   Vaping status: Never Used  Substance and Sexual Activity   Alcohol use: Yes    Comment: infreqently   Drug use: No   Sexual activity: Yes  Other Topics Concern   Not on file  Social History Narrative   Not on file   Social Drivers of Health   Financial Resource Strain: Low Risk  (04/18/2023)   Received from [facility]   Overall Financial Resource Strain (CARDIA)    Difficulty of Paying Living Expenses: Not very hard  Food Insecurity: No Food Insecurity (  04/18/2023)   [facility]   Hunger Vital Sign    Within the past 12 months, you worried that your food would run out before you got the money to buy more.: Never true    Within the past 12 months, the food you bought just didn't last and you didn't have money to get more.: Never true  Transportation Needs: No Transportation Needs (04/18/2023)   Received from [facility] - Transportation    In the past 12 months, has lack of transportation kept you from medical appointments or from getting medications?: No    Lack of Transportation (Non-Medical): No  Physical Activity: Not on file  Stress: Not on file  Social Connections: Not on file  Intimate Partner Violence: Not on file    FAMILY HISTORY: Family History  Problem Relation Age of Onset   Prostate cancer Neg Hx    Bladder Cancer Neg Hx    Kidney cancer Neg Hx     ALLERGIES:  has no known allergies.  MEDICATIONS:  Current Outpatient Medications  Medication Sig Dispense Refill   ACCU-CHEK AVIVA PLUS test strip      albuterol  (VENTOLIN  HFA) 108 (90 Base) MCG/ACT inhaler Inhale into the lungs.     allopurinol (ZYLOPRIM) 100 MG tablet Take 1 tablet by mouth daily.     aspirin 81 MG tablet Take 81 mg by mouth daily.     atorvastatin (LIPITOR) 40 MG tablet Take 40 mg by mouth daily.     BD INSULIN SYRINGE U/F 31G X 5/16 0.5 ML MISC 4 (four) times daily.     cyanocobalamin  (VITAMIN B12) 1000 MCG tablet Take 1,000 mcg by mouth daily.     ferrous sulfate  325 (65 FE) MG tablet Take 1 tablet (325 mg total) by mouth every other day. 45 tablet 2   gabapentin  (NEURONTIN ) 400 MG capsule Take 1 capsule (400 mg total) by mouth every 8 (eight) hours. 90 capsule 2   insulin glargine (LANTUS) 100 UNIT/ML injection Inject 50 Units into the skin 2 (two) times daily.     insulin lispro (HUMALOG) 100 UNIT/ML injection Inject 30 Units into the skin 3 (three) times daily with meals.     JARDIANCE 10 MG TABS tablet Take 10 mg by mouth daily.     lidocaine  (XYLOCAINE ) 5 % ointment Apply 1 Application topically daily as needed.     lisinopril (PRINIVIL,ZESTRIL) 10 MG tablet Take 10 mg by mouth daily.     omeprazole  (PRILOSEC) 20 MG capsule Take 20 mg by mouth daily.     oxyCODONE -acetaminophen  (PERCOCET/ROXICET) 5-325 MG tablet Take 1 tablet by mouth every 4 (four) hours as needed for  severe pain. 30 tablet 0   tamsulosin (FLOMAX) 0.4 MG CAPS capsule TAKE 1 CAPSULE BY MOUTH EVERY DAY 30 MINUTES AFTER THE SAME MEAL     No current facility-administered medications for this visit.    PHYSICAL EXAMINATION:   Vitals:   10/03/23 1413  BP: (!) 131/55  Pulse: 84  Resp: 20  Temp: (!) 97.4 F (36.3 C)  SpO2: 97%   Filed Weights   10/03/23 1413  Weight: 274 lb 1.6 oz (124.3 kg)    Physical Exam Vitals and nursing note reviewed.  HENT:     Head: Normocephalic and atraumatic.     Mouth/Throat:     Pharynx: Oropharynx is clear.  Eyes:     Extraocular Movements: Extraocular movements intact.     Pupils:  Pupils are equal, round, and reactive to light.  Cardiovascular:     Rate and Rhythm: Normal rate and regular rhythm.  Pulmonary:     Comments: Decreased breath sounds bilaterally.  Abdominal:     Palpations: Abdomen is soft.  Musculoskeletal:        General: Normal range of motion.     Cervical back: Normal range of motion.  Skin:    General: Skin is warm.  Neurological:     General: No focal deficit present.     Mental Status: He is alert and oriented to person, place, and time.  Psychiatric:        Behavior: Behavior normal.        Judgment: Judgment normal.     LABORATORY DATA:  I have reviewed the data as listed Lab Results  Component Value Date   WBC 7.5 10/03/2023   HGB 11.8 (L) 10/03/2023   HCT 35.7 (L) 10/03/2023   MCV 82.6 10/03/2023   PLT 304 10/03/2023   Recent Labs    03/28/23 1408  NA 132*  K 3.8  CL 98  CO2 23  GLUCOSE 227*  BUN 12  CREATININE 0.81  CALCIUM 8.7*  GFRNONAA >60    RADIOGRAPHIC STUDIES: I have personally reviewed the radiological images as listed and agreed with the findings in the report. No results found.   Symptomatic anemia # Iron deficiency anemia -  Colonoscopy and endoscopy was performed by Dr. Therisa on 01/14/2022.  Showed single small non bleeding angioectasia in the stomach and multiple bleeding  angioectasias in the duodenum treated with argon coagulation. Two polyps removed from ascending colon showing tubular adenomas.  # Hemoglobin is 11.3.  Iron studies consistent with iron deficiency.  Proceed with Feraheme  x 2.   # B12 deficiency: -Celiac panel was negative; -Continue with B12 1000 mcg daily.  B12 improved to around 600.   # H pylori positive: -Triple drug therapy on 11/17/2021 by GI.   # Joint pain-on narcotics defer to Dr. Lazarus.  Will hold off prescribing any narcotics.  # DISPOSITION: # ferrahem- weekly x 2 - start next week.  # follow up in 4 months- MD; labs- bc/cmp; iron studies; ferritin- possible ferrahem- Dr.B    Above plan of care was discussed with patient/family in detail.  My contact information was given to the patient/family.       Cindy JONELLE Joe, MD 10/03/2023 4:34 PM

## 2023-10-03 NOTE — Assessment & Plan Note (Addendum)
#   Iron deficiency anemia -  Colonoscopy and endoscopy was performed by Dr. Therisa on 01/14/2022.  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 Two polyps removed from ascending colon showing tubular adenomas.  # Hemoglobin is 11.3.  Iron studies consistent with iron deficiency.  Proceed with Feraheme  x 2.   # B12 deficiency: -Celiac panel was negative; -Continue with B12 1000 mcg daily.  B12 improved to around 600.   # H pylori positive: -Triple drug therapy on 11/17/2021 by GI.   # Joint pain-on narcotics defer to Dr. Lazarus.  Will hold off prescribing any narcotics.  # DISPOSITION: # ferrahem- weekly x 2 - start next week.  # follow up in 4 months- MD; labs- bc/cmp; iron studies; ferritin- possible ferrahem- Dr.B

## 2023-10-03 NOTE — Progress Notes (Signed)
 Fatigue/weakness: NO Dyspena: NO  Light headedness: NO Blood in stool: NO  C/O lt foot pain 8/10, asking for refill of oxy until he gets his appt with [facility] pain 10/18/23.

## 2023-10-05 LAB — VITAMIN B1: Vitamin B1 (Thiamine): 76.4 nmol/L (ref 66.5–200.0)

## 2023-10-06 ENCOUNTER — Encounter: Payer: Self-pay | Admitting: Internal Medicine

## 2023-10-06 NOTE — Progress Notes (Signed)
 No show

## 2023-10-09 ENCOUNTER — Ambulatory Visit: Admitting: Internal Medicine

## 2023-10-09 ENCOUNTER — Inpatient Hospital Stay

## 2023-10-09 VITALS — BP 113/57 | HR 70 | Temp 96.4°F | Resp 18

## 2023-10-09 DIAGNOSIS — D509 Iron deficiency anemia, unspecified: Secondary | ICD-10-CM

## 2023-10-09 MED ORDER — SODIUM CHLORIDE 0.9 % IV SOLN
INTRAVENOUS | Status: DC
  Filled 2023-10-09: qty 250

## 2023-10-09 MED ORDER — SODIUM CHLORIDE 0.9 % IV SOLN
510.0000 mg | Freq: Once | INTRAVENOUS | Status: AC
  Administered 2023-10-09 (×2): 510 mg via INTRAVENOUS
  Filled 2023-10-09: qty 510

## 2023-10-09 NOTE — Patient Instructions (Signed)

## 2023-10-16 ENCOUNTER — Inpatient Hospital Stay

## 2023-10-16 VITALS — BP 123/61 | HR 78 | Temp 96.8°F | Resp 18

## 2023-10-16 DIAGNOSIS — D509 Iron deficiency anemia, unspecified: Secondary | ICD-10-CM | POA: Diagnosis not present

## 2023-10-16 MED ORDER — SODIUM CHLORIDE 0.9 % IV SOLN
510.0000 mg | Freq: Once | INTRAVENOUS | Status: AC
  Administered 2023-10-16: 510 mg via INTRAVENOUS
  Filled 2023-10-16: qty 510

## 2023-10-16 NOTE — Patient Instructions (Signed)

## 2023-10-18 ENCOUNTER — Encounter: Payer: Self-pay | Admitting: Student in an Organized Health Care Education/Training Program

## 2023-10-18 ENCOUNTER — Ambulatory Visit
Attending: Student in an Organized Health Care Education/Training Program | Admitting: Student in an Organized Health Care Education/Training Program

## 2023-10-18 VITALS — BP 126/68 | HR 83 | Temp 97.2°F | Resp 20 | Ht 68.0 in | Wt 271.0 lb

## 2023-10-18 DIAGNOSIS — E1140 Type 2 diabetes mellitus with diabetic neuropathy, unspecified: Secondary | ICD-10-CM | POA: Diagnosis present

## 2023-10-18 MED ORDER — CAPSAICIN-CLEANSING GEL 8 % EX KIT
2.0000 | PACK | Freq: Once | CUTANEOUS | Status: AC
  Administered 2023-10-18: 2 via TOPICAL
  Filled 2023-10-18: qty 2

## 2023-10-18 NOTE — Progress Notes (Signed)
 PROVIDER NOTE: Interpretation of information contained herein should be left to medically-trained personnel. Specific patient instructions are provided elsewhere under Patient Instructions section of medical record. This document was created in part using STT-dictation technology, any transcriptional errors that may result from this process are unintentional.  Patient: Randy [name] Type: Established DOB: 06-May-1950 MRN: [id number] PCP: [facility]  Service: Procedure DOS: 10/18/2023 Setting: Ambulatory Location: Ambulatory outpatient facility Delivery: Face-to-face Provider: Wallie Sherry, MD Specialty: Interventional Pain Management Specialty designation: 09 Location: Outpatient facility Ref. Prov.: [facility]       Interventional Therapy   Interventional Treatment:           Type: Qutenza  Neurolysis #3  Laterality:  Left Area treated: Feet Imaging Guidance: None Anesthesia/analgesia/anxiolysis/sedation: None required Medication (Right): Qutenza  (capsaicin  8%) topical system Medication (Left): Qutenza  (capsaicin  8%) topical system Date: 10/18/2023 Performed by: Wallie Sherry, MD Rationale (medical necessity): procedure needed and proper for the treatment of Randy [name]'s medical symptoms and needs. 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 1. Chronic painful diabetic neuropathy (HCC)      NAS-11 Pain score:   Pre-procedure: 9 /10   Post-procedure: 9 /10     Position / Prep / Materials:  Position: Supine  Materials: Qutenza  Kit  Pre-op H&P Assessment:  Randy [name] is a 73 y.o. (year old), male patient, seen today for interventional treatment. He  has a past surgical history that includes Hernia repair; Esophagogastroduodenoscopy (N/A, 01/04/2022); and Colonoscopy with propofol  (N/A, 01/04/2022). Randy [name] has a current medication list which includes  the following prescription(s): accu-chek aviva plus, albuterol , allopurinol, aspirin, atorvastatin, bd insulin syringe u/f, cyanocobalamin , ferrous sulfate , gabapentin , insulin glargine, insulin lispro, jardiance, lidocaine , lisinopril, omeprazole , oxycodone -acetaminophen , and tamsulosin. His primarily concern today is the Foot Pain (left)  Initial Vital Signs:  Pulse/HCG Rate: 83  Temp: (!) 97.2 F (36.2 C) Resp: 20 BP: 126/68 SpO2: 95 %  BMI: Estimated body mass index is 41.21 kg/m as calculated from the following:   Height as of this encounter: 5' 8 (1.727 m).   Weight as of this encounter: 271 lb (122.9 kg).  Risk Assessment: Allergies: Reviewed. He has no known allergies.  Allergy Precautions: None required Coagulopathies: Reviewed. None identified.  Blood-thinner therapy: None at this time Active Infection(s): Reviewed. None identified. Randy [name] is afebrile  Site Confirmation: Randy [name] was asked to confirm the procedure and laterality before marking the site Procedure checklist: Completed Consent: Before the procedure and under the influence of no sedative(s), amnesic(s), or anxiolytics, the patient was informed of the treatment options, risks and possible complications. To fulfill our ethical and legal obligations, as recommended by the American Medical Association's Code of Ethics, I have informed the patient of my clinical impression; the nature and purpose of the treatment or procedure; the risks, benefits, and possible complications of the intervention; the alternatives, including doing nothing; the risk(s) and benefit(s) of the alternative treatment(s) or procedure(s); and the risk(s) and benefit(s) of doing nothing. The patient was provided information about the general risks and possible complications associated with the procedure. These may include, but are not limited to: failure to achieve desired goals, infection, bleeding, organ or nerve damage, allergic reactions,  paralysis, and death. In addition, the patient was informed of those risks and complications associated to the procedure, such as failure to decrease pain; infection; bleeding; organ or nerve damage with subsequent damage to sensory, motor, and/or autonomic systems,  resulting in permanent pain, numbness, and/or weakness of one or several areas of the body; allergic reactions; (i.e.: anaphylactic reaction); and/or death. Furthermore, the patient was informed of those risks and complications associated with the medications. These include, but are not limited to: allergic reactions (i.e.: anaphylactic or anaphylactoid reaction(s)); adrenal axis suppression; blood sugar elevation that in diabetics may result in ketoacidosis or comma; water retention that in patients with history of congestive heart failure may result in shortness of breath, pulmonary edema, and decompensation with resultant heart failure; weight gain; swelling or edema; medication-induced neural toxicity; particulate matter embolism and blood vessel occlusion with resultant organ, and/or nervous system infarction; and/or aseptic necrosis of one or more joints. Finally, the patient was informed that Medicine is not an exact science; therefore, there is also the possibility of unforeseen or unpredictable risks and/or possible complications that may result in a catastrophic outcome. The patient indicated having understood very clearly. We have given the patient no guarantees and we have made no promises. Enough time was given to the patient to ask questions, all of which were answered to the patient's satisfaction. Randy [name] has indicated that he wanted to continue with the procedure. Attestation: I, the ordering provider, attest that I have discussed with the patient the benefits, risks, side-effects, alternatives, likelihood of achieving goals, and potential problems during recovery for the procedure that I have provided informed consent. Date   Time: 10/18/2023  8:40 AM  Pre-Procedure Preparation:  Monitoring: As per clinic protocol. Respiration, ETCO2, SpO2, BP, heart rate and rhythm monitor placed and checked for adequate function Safety Precautions: Patient was assessed for positional comfort and pressure points before starting the procedure. Time-out: I initiated and conducted the Time-out before starting the procedure, as per protocol. The patient was asked to participate by confirming the accuracy of the Time Out information. Verification of the correct person, site, and procedure were performed and confirmed by me, the nursing staff, and the patient. Time-out conducted as per Joint Commission's Universal Protocol (UP.01.01.01). Time: 0856 Start Time: 0902 hrs.  Description/Narrative of Procedure:          Region: Distal lower extremity Target Area: Sensory peripheral nerves affected by diabetic peripheral neuropathy Site: Feet Approach: Percutaneous  No./Series: Not applicable  Type: Percutaneous  Purpose: Therapeutic  Region: Distal lower extremities  Start Time: 0902 hrs.  Description of the Procedure: Protocol guidelines were followed. The patient was assisted into a comfortable position.  Informed consent was obtained in the patient monitored in the usual manner.  All questions were answered prior to the procedure.  They Qutenza  patches were applied to the affected area and then covered with the wrap.  The Patient was kept under observation until the treatment was completed.  The patches were removed and the treated area was inspected.  Vitals:   10/18/23 0844  BP: 126/68  Pulse: 83  Resp: 20  Temp: (!) 97.2 F (36.2 C)  TempSrc: Temporal  SpO2: 95%  Weight: 271 lb (122.9 kg)  Height: 5' 8 (1.727 m)     End Time: 0942 hrs.  Imaging Guidance:          Type of Imaging Technique: None used Indication(s): N/A Exposure Time: No patient exposure Contrast: None used. Fluoroscopic Guidance:  N/A Ultrasound Guidance: N/A Interpretation: N/A  Post-operative Assessment:  Post-procedure Vital Signs:  Pulse/HCG Rate: 83  Temp: (!) 97.2 F (36.2 C) Resp: 20 BP: 126/68 SpO2: 95 %  EBL: None  Complications: No immediate post-treatment complications observed  by team, or reported by patient.  Note: The patient tolerated the entire procedure well. A repeat set of vitals were taken after the procedure and the patient was kept under observation following institutional policy, for this type of procedure. Post-procedural neurological assessment was performed, showing return to baseline, prior to discharge. The patient was provided with post-procedure discharge instructions, including a section on how to identify potential problems. Should any problems arise concerning this procedure, the patient was given instructions to immediately contact us , at any time, without hesitation. In any case, we plan to contact the patient by telephone for a follow-up status report regarding this interventional procedure.  Comments:  No additional relevant information.  Plan of Care (POC)  Orders:  Orders Placed This Encounter  Procedures   NEUROLYSIS    Please order Qutenza  patches    Standing Status:   Future    Expected Date:   01/18/2024    Expiration Date:   10/17/2024    Where will this procedure be performed?:   [facility]     Medications ordered for procedure: Meds ordered this encounter  Medications   capsaicin  topical system 8 % patch 2 patch    Disregard order if transmitted to pharmacy. Qutenza  Kit to [facility] used from [facility].   Medications administered: We administered capsaicin  topical system.  See the medical record for exact dosing, route, and time of administration.  Follow-up plan:   Return in about 3 months (around 01/18/2024) for Qutenza  #4.       Recent Visits Date Type Provider Dept  09/06/23 Procedure visit Marcelino Nurse, MD [facility]  Showing recent visits within past 90 days and meeting all other requirements Today's Visits Date Type Provider Dept  10/18/23 Procedure visit Marcelino Nurse, MD [facility]  Showing today's visits and meeting all other requirements Future Appointments No visits were found meeting these conditions. Showing future appointments within next 90 days and meeting all other requirements  Disposition: Discharge home  Discharge (Date  Time): 10/18/2023; 0945 hrs.   Primary Care Physician: [facility] Location: [facility] Note by: Nurse Marcelino, MD (TTS technology used. I apologize for any typographical errors that were not detected and corrected.) Date: 10/18/2023; Time: 9:50 AM  Disclaimer:  Medicine is not an Visual merchandiser. The only guarantee in medicine is that nothing is guaranteed. It is important to note that the decision to proceed with this intervention was based on the information collected from the patient. The Data and conclusions were drawn from the patient's questionnaire, the interview, and the physical examination. Because the information was provided in large part by the patient, it cannot be guaranteed that it has not been purposely or unconsciously manipulated. Every effort has been made to obtain as much relevant data as possible for this evaluation. It is important to note that the conclusions that lead to this procedure are derived in large part from the available data. Always take into account that the treatment will also be dependent on availability of resources and existing treatment guidelines, considered by other Pain Management Practitioners as being common knowledge and practice, at the time of the intervention. For Medico-Legal purposes, it is also important to point out that variation in procedural techniques and pharmacological choices are the acceptable norm. The indications, contraindications, technique,  and results of the above procedure should only be interpreted and judged by a Board-Certified Interventional Pain Specialist with extensive familiarity and  expertise in the same exact procedure and technique.

## 2023-10-19 ENCOUNTER — Telehealth: Payer: Self-pay

## 2023-10-19 NOTE — Telephone Encounter (Signed)
 Post procedure follow up.  LM

## 2023-10-25 ENCOUNTER — Telehealth: Payer: Self-pay | Admitting: Student in an Organized Health Care Education/Training Program

## 2023-10-25 NOTE — Telephone Encounter (Signed)
 Had Qutenza  last week. It helped with the pain for a couple of days, but now pain is worse again.  Asking for something to take for pain. I informed him that it would not be an opioid.

## 2023-10-25 NOTE — Telephone Encounter (Signed)
 PT called stated that he is having pain in his left feet. Wants to see will Lateef giving him something to take. Please give patient a call. TY

## 2023-10-30 ENCOUNTER — Emergency Department
Admission: EM | Admit: 2023-10-30 | Discharge: 2023-10-30 | Disposition: A | Discharge status: Home / Self Care | Attending: Emergency Medicine | Admitting: Emergency Medicine

## 2023-10-30 ENCOUNTER — Emergency Department

## 2023-10-30 ENCOUNTER — Other Ambulatory Visit: Payer: Self-pay

## 2023-10-30 DIAGNOSIS — G319 Degenerative disease of nervous system, unspecified: Secondary | ICD-10-CM | POA: Diagnosis not present

## 2023-10-30 DIAGNOSIS — I672 Cerebral atherosclerosis: Secondary | ICD-10-CM | POA: Insufficient documentation

## 2023-10-30 DIAGNOSIS — M542 Cervicalgia: Secondary | ICD-10-CM | POA: Diagnosis present

## 2023-10-30 DIAGNOSIS — R519 Headache, unspecified: Secondary | ICD-10-CM | POA: Insufficient documentation

## 2023-10-30 DIAGNOSIS — M436 Torticollis: Secondary | ICD-10-CM | POA: Insufficient documentation

## 2023-10-30 DIAGNOSIS — M47812 Spondylosis without myelopathy or radiculopathy, cervical region: Secondary | ICD-10-CM | POA: Insufficient documentation

## 2023-10-30 DIAGNOSIS — J029 Acute pharyngitis, unspecified: Secondary | ICD-10-CM | POA: Insufficient documentation

## 2023-10-30 DIAGNOSIS — M50321 Other cervical disc degeneration at C4-C5 level: Secondary | ICD-10-CM | POA: Insufficient documentation

## 2023-10-30 DIAGNOSIS — I700 Atherosclerosis of aorta: Secondary | ICD-10-CM | POA: Insufficient documentation

## 2023-10-30 LAB — BASIC METABOLIC PANEL WITH GFR
Anion gap: 10 (ref 5–15)
BUN: 7 mg/dL — ABNORMAL LOW (ref 8–23)
CO2: 27 mmol/L (ref 22–32)
Calcium: 9.1 mg/dL (ref 8.9–10.3)
Chloride: 100 mmol/L (ref 98–111)
Creatinine, Ser: 0.81 mg/dL (ref 0.61–1.24)
GFR, Estimated: >60 mL/min (ref >60)
Glucose, Bld: 89 mg/dL (ref 70–99)
Potassium: 3.5 mmol/L (ref 3.5–5.1)
Sodium: 137 mmol/L (ref 135–145)

## 2023-10-30 LAB — CBC WITH DIFFERENTIAL/PLATELET
Abs Immature Granulocytes: 0.04 K/uL (ref 0.00–0.07)
Basophils Absolute: 0 K/uL (ref 0.0–0.1)
Basophils Relative: 0 %
Eosinophils Absolute: 0.3 K/uL (ref 0.0–0.5)
Eosinophils Relative: 4 %
HCT: 38.9 % — ABNORMAL LOW (ref 39.0–52.0)
Hemoglobin: 12.8 g/dL — ABNORMAL LOW (ref 13.0–17.0)
Immature Granulocytes: 1 %
Lymphocytes Relative: 21 %
Lymphs Abs: 1.7 K/uL (ref 0.7–4.0)
MCH: 28.2 pg (ref 26.0–34.0)
MCHC: 32.9 g/dL (ref 30.0–36.0)
MCV: 85.7 fL (ref 80.0–100.0)
Monocytes Absolute: 1 K/uL (ref 0.1–1.0)
Monocytes Relative: 12 %
Neutro Abs: 5.1 K/uL (ref 1.7–7.7)
Neutrophils Relative %: 62 %
Platelets: 225 K/uL (ref 150–400)
RBC: 4.54 MIL/uL (ref 4.22–5.81)
RDW: 17.4 % — ABNORMAL HIGH (ref 11.5–15.5)
WBC: 8.2 K/uL (ref 4.0–10.5)
nRBC: 0 % (ref 0.0–0.2)

## 2023-10-30 LAB — GROUP A STREP BY PCR: Group A Strep by PCR: NOT DETECTED

## 2023-10-30 LAB — CBG MONITORING, ED: Glucose-Capillary: 87 mg/dL (ref 70–99)

## 2023-10-30 LAB — RESP PANEL BY RT-PCR (RSV, FLU A&B, COVID)  RVPGX2
Influenza A by PCR: NEGATIVE
Influenza B by PCR: NEGATIVE
Resp Syncytial Virus by PCR: NEGATIVE
SARS Coronavirus 2 by RT PCR: NEGATIVE

## 2023-10-30 MED ORDER — CYCLOBENZAPRINE HCL 5 MG PO TABS: 2.5000 mg | ORAL_TABLET | Freq: Three times a day (TID) | ORAL | 0 refills | Status: AC | PRN

## 2023-10-30 MED ORDER — KETOROLAC TROMETHAMINE 30 MG/ML IJ SOLN
15.0000 mg | Freq: Once | INTRAMUSCULAR | Status: AC
  Administered 2023-10-30: 15 mg via INTRAVENOUS
  Filled 2023-10-30: qty 1

## 2023-10-30 MED ORDER — CYCLOBENZAPRINE HCL 10 MG PO TABS
5.0000 mg | ORAL_TABLET | Freq: Once | ORAL | Status: AC
  Administered 2023-10-30: 5 mg via ORAL
  Filled 2023-10-30: qty 1

## 2023-10-30 MED ORDER — IOHEXOL 300 MG/ML  SOLN
75.0000 mL | Freq: Once | INTRAMUSCULAR | Status: AC | PRN
Start: 2023-10-30 — End: 2023-10-30
  Administered 2023-10-30: 75 mL via INTRAVENOUS

## 2023-10-30 MED ORDER — MORPHINE SULFATE (PF) 4 MG/ML IV SOLN
4.0000 mg | Freq: Once | INTRAVENOUS | Status: AC
  Administered 2023-10-30: 4 mg via INTRAVENOUS
  Filled 2023-10-30: qty 1

## 2023-10-30 MED ORDER — ONDANSETRON HCL 4 MG/2ML IJ SOLN
4.0000 mg | INTRAMUSCULAR | Status: AC
  Administered 2023-10-30: 4 mg via INTRAVENOUS
  Filled 2023-10-30: qty 2

## 2023-10-30 NOTE — ED Provider Notes (Signed)
 [facility] Provider Note    Event Date/Time   First MD Initiated Contact with Patient 10/30/23 0002     (approximate)   History   Neck Pain and Back Pain   HPI Randy [name] is a 73 y.o. male who presents for evaluation of sore throat that has developed into bilateral anterior neck pain.  He said that he first started noticing pain in his throat and the front of his neck about 48 hours ago.  He woke up from sleep and his neck was sore and he thought maybe it was the position that he was sleeping in, but the pain is steadily gotten worse over the last couple days and now he feels like it is tough to swallow.  He said that he has a tooth in the front that he is post to get pulled at some point later this month, but he has no known infections at this time in his mouth or throat.  He has not been running a fever.  He has not been coughing or having any shortness of breath.  He said that the pain is severe and is hard for him to move his neck from side-to-side.  It hurts when he flexes and extends his head as well but it hurts in the front, not in the back.  He has had no recent falls or any kind of trauma.     Physical Exam   Triage Vital Signs: ED Triage Vitals  Encounter Vitals Group     BP 10/30/23 0008 134/68     Girls Systolic BP Percentile --      Girls Diastolic BP Percentile --      Boys Systolic BP Percentile --      Boys Diastolic BP Percentile --      Pulse Rate 10/30/23 0008 81     Resp 10/30/23 0008 20     Temp 10/30/23 0008 98.4 F (36.9 C)     Temp Source 10/30/23 0008 Oral     SpO2 10/30/23 0008 96 %     Weight 10/30/23 0006 123 kg (271 lb 2.7 oz)     Height 10/30/23 0006 1.727 m (5' 8)     Head Circumference --      Peak Flow --      Pain Score 10/30/23 0006 10     Pain Loc --      Pain Education --      Exclude from Growth Chart --     Most recent vital signs: Vitals:   10/30/23 0405 10/30/23 0414  BP: 127/65 127/65  Pulse:  67 67  Resp:  18  Temp:  98.3 F (36.8 C)  SpO2: 99% 99%    General: Awake, alert, appears uncomfortable but nontoxic. HEENT: Patient has severe tenderness to submandibular palpation on bilateral sides of the neck/throat.  No palpable induration but it is very tender.  He is able to turn his head from side-to-side minimally and is able to flex and extend minimally.  His symptoms are not consistent with meningismus as they seem to be isolated to the anterior neck.  His oropharynx shows no obvious abnormalities although the view of the posterior oropharynx is limited, but from the somewhat limited view there is no evidence of peritonsillar abscess or tonsillitis.  He has tenderness to manipulation of the larynx.  No tenderness to palpation of the cervical spine.  He is tolerating his secretions and is in a semirecumbent position but his  voice is a bit hoarse. CV:  Good peripheral perfusion.  Regular rate and rhythm. Resp:  Normal effort. Speaking easily and comfortably, no accessory muscle usage nor intercostal retractions.   Abd:  Obese, soft, no tenderness to palpation.  Patient has an abdominal hernia that is soft and nontender.   ED Results / Procedures / Treatments   Labs (all labs ordered are listed, but only abnormal results are displayed) Labs Reviewed  BASIC METABOLIC PANEL WITH GFR - Abnormal; Notable for the following components:      Result Value   BUN 7 (*)    All other components within normal limits  CBC WITH DIFFERENTIAL/PLATELET - Abnormal; Notable for the following components:   Hemoglobin 12.8 (*)    HCT 38.9 (*)    RDW 17.4 (*)    All other components within normal limits  GROUP A STREP BY PCR  RESP PANEL BY RT-PCR (RSV, FLU A&B, COVID)  [id number]  CBG MONITORING, ED      RADIOLOGY See ED course for details   PROCEDURES:  Critical Care performed: No  Procedures    IMPRESSION / MDM / ASSESSMENT AND PLAN / ED COURSE  I reviewed the triage vital signs and  the nursing notes.                              Differential diagnosis includes, but is not limited to, retropharyngeal infection, odontogenic infection, Ludwig's angina, lymphadenopathy/lymphadenitis, viral illness, less likely meningitis.  Patient's presentation is most consistent with acute presentation with potential threat to life or bodily function.  Labs/studies ordered: Respiratory viral panel, group A strep, CBC with differential, BMP, CT head, CT soft tissue neck, CT C-spine no charge  Interventions/Medications given:  Medications  morphine  (PF) 4 MG/ML injection 4 mg (4 mg Intravenous Given 10/30/23 0222)  ondansetron  (ZOFRAN ) injection 4 mg (4 mg Intravenous Given 10/30/23 0221)  ketorolac  (TORADOL ) 30 MG/ML injection 15 mg (15 mg Intravenous Given 10/30/23 0221)  iohexol  (OMNIPAQUE ) 300 MG/ML solution 75 mL (75 mLs Intravenous Contrast Given 10/30/23 0229)  cyclobenzaprine  (FLEXERIL ) tablet 5 mg (5 mg Oral Given 10/30/23 0402)    (Note:  hospital course my include additional interventions and/or labs/studies not listed above.)   Patient has reassuring vitals with no positive SIRS criteria.  However the tenderness and pain in the anterior neck is worrisome.  I will proceed with lab work including viral testing (respiratory viral panel) and strep PCR.  Will obtain CT soft tissue neck.  I am obtaining a noncontrast head CT as well on the off chance that the clinical picture becomes more consistent with meningitis.  However at this point, I think it is a very low probability and I do not think he would benefit from emergent LP.  Patient agrees with the plan.  I ordered morphine  4 mg IV, Zofran  4 mg IV, and Toradol  15 mg IV to help with the symptoms while performing the workup.     Clinical Course as of 10/30/23 0440  Mon Oct 30, 2023  0238 Group A Strep by PCR: NOT DETECTED [CF]  0259 Resp panel by RT-PCR (RSV, Flu A&B, Covid) Anterior Nasal Swab [CF]  0347 I independently viewed and  interpreted the patient's CT soft tissue neck, head, and C-spine.  I see no evidence of any intracranial issues and no obvious inflammation or infection in his neck.  This was confirmed by radiology. [CF]  [id number] I reassessed the  patient and he said he is feeling better.  We went through range of motion and he is able to flex and extend his neck without any reproduction of pain and with no meningismus.  When he turns his head to the right he feels some discomfort in the soft tissues, less so when he turns to the left.  There is no evidence he is suffering from an emergent infectious process. Patient he continues to believe that he slept in an awkward position and I think it is likely musculoskeletal at this point after the extensive evaluation that has been reassuring.  We discussed it and he will try a low-dose muscle relaxer and follow-up as an outpatient.  I think that is very reasonable and appropriate as there is no evidence of an acute or emergent condition at this time requiring him to stay in the hospital.  I gave my usual return precautions. [CF]    Clinical Course User Index [CF] Gordan Huxley, MD     FINAL CLINICAL IMPRESSION(S) / ED DIAGNOSES   Final diagnoses:  Torticollis     Rx / DC Orders   ED Discharge Orders          Ordered    cyclobenzaprine  (FLEXERIL ) 5 MG tablet  3 times daily PRN        10/30/23 0351             Note:  This document was prepared using Dragon voice recognition software and may include unintentional dictation errors.   Gordan Huxley, MD 10/30/23 [phone]

## 2023-10-30 NOTE — ED Notes (Signed)
Pt given ice water and crackers

## 2023-10-30 NOTE — ED Notes (Signed)
 Pt called out and stated that he wanted his sugar checked.  Pt was informed when labs were drawn his GLUCOSE WAS 89. The pt stated that is low for him and that he usually is in the 130s.  Finger stick CBG will be done and provider will be notified of same.

## 2023-10-30 NOTE — Discharge Instructions (Signed)
 Please review the included information.  You may want to stick with your regular medication including over-the-counter Tylenol , but if you need to use the prescribed muscle relaxer, do so carefully as it may make you sleepy.  Follow-up with your regular doctor at the next available opportunity.  Return to the emergency department if you develop new or worsening symptoms that concern you.

## 2023-10-30 NOTE — ED Triage Notes (Addendum)
 To ER from home via EMS for report of neck pain and lower back pain that began yesterday morning. No injury. Reports being unable to move neck. Was very tender to lightest touch. Denies any fevers or sick contacts. Has not tried anything for pain at home.

## 2023-10-30 NOTE — ED Notes (Addendum)
 Pt reports back and neck pain that started today with no prior injury to same.

## 2024-01-17 ENCOUNTER — Ambulatory Visit
Attending: Student in an Organized Health Care Education/Training Program | Admitting: Student in an Organized Health Care Education/Training Program

## 2024-01-17 ENCOUNTER — Encounter: Payer: Self-pay | Admitting: Student in an Organized Health Care Education/Training Program

## 2024-01-17 VITALS — BP 143/67 | HR 92 | Resp 20 | Ht 68.0 in | Wt 271.0 lb

## 2024-01-17 DIAGNOSIS — E1140 Type 2 diabetes mellitus with diabetic neuropathy, unspecified: Secondary | ICD-10-CM | POA: Insufficient documentation

## 2024-01-17 MED ORDER — CAPSAICIN-CLEANSING GEL 8 % EX KIT
4.0000 | PACK | Freq: Once | CUTANEOUS | Status: AC
  Administered 2024-01-17: 2 via TOPICAL

## 2024-01-17 NOTE — Progress Notes (Signed)
 PROVIDER NOTE: Interpretation of information contained herein should be left to medically-trained personnel. Specific patient instructions are provided elsewhere under Patient Instructions section of medical record. This document was created in part using STT-dictation technology, any transcriptional errors that may result from this process are unintentional.  Patient: Randy [name] Type: Established DOB: August 13, 1950 MRN: [id number] [facility]CP: [facility]  Service: Procedure DOS: 01/17/2024 Setting: Ambulatory Location: Ambulatory outpatient facility Delivery: Face-to-face Provider: Wallie Sherry, MD Specialty: Interventional Pain Management Specialty designation: 09 Location: Outpatient facility Ref. Prov.: [facility]       Interventional Therapy   Interventional Treatment:           Type: Qutenza  Neurolysis #4  Laterality:  Left Area treated: Feet Imaging Guidance: None Anesthesia/analgesia/anxiolysis/sedation: None required Medication (Right): Qutenza  (capsaicin  8%) topical system Medication (Left): Qutenza  (capsaicin  8%) topical system Date: 01/17/2024 Performed by: Wallie Sherry, MD Rationale (medical necessity): procedure needed and proper for the treatment of Mr. Hearne's medical symptoms and needs. 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 1. Chronic painful diabetic neuropathy (HCC)       NAS-11 Pain score:   Pre-procedure: 10-Worst pain ever/10   Post-procedure: 10-Worst pain ever/10     Position / Prep / Materials:  Position: Supine  Materials: Qutenza  Kit  Pre-op H&P Assessment:  Mr. [name] is a 73 y.o. (year old), male patient, seen today for interventional treatment. He  has a past surgical history that includes Hernia repair; Esophagogastroduodenoscopy (N/A, 01/04/2022); and Colonoscopy with propofol  (N/A, 01/04/2022). Mr. [name] has a  current medication list which includes the following prescription(s): accu-chek aviva plus, albuterol , allopurinol, aspirin, atorvastatin, bd insulin syringe u/f, cyanocobalamin , cyclobenzaprine , ferrous sulfate , gabapentin , insulin glargine, insulin lispro, jardiance, lidocaine , lisinopril, omeprazole , oxycodone -acetaminophen , and tamsulosin, and the following Facility-Administered Medications: capsaicin  topical system. His primarily concern today is the Foot Pain  Initial Vital Signs:  Pulse/HCG Rate: 92  Temp:   Resp: 20 BP: (!) 143/67 SpO2: 97 %  BMI: Estimated body mass index is 41.21 kg/m as calculated from the following:   Height as of this encounter: 5' 8 (1.727 m).   Weight as of this encounter: 271 lb (122.9 kg).  Risk Assessment: Allergies: Reviewed. He has no known allergies.  Allergy Precautions: None required Coagulopathies: Reviewed. None identified.  Blood-thinner therapy: None at this time Active Infection(s): Reviewed. None identified. Mr. Min is afebrile  Site Confirmation: Mr. [name] was asked to confirm the procedure and laterality before marking the site Procedure checklist: Completed Consent: Before the procedure and under the influence of no sedative(s), amnesic(s), or anxiolytics, the patient was informed of the treatment options, risks and possible complications. To fulfill our ethical and legal obligations, as recommended by the American Medical Association's Code of Ethics, I have informed the patient of my clinical impression; the nature and purpose of the treatment or procedure; the risks, benefits, and possible complications of the intervention; the alternatives, including doing nothing; the risk(s) and benefit(s) of the alternative treatment(s) or procedure(s); and the risk(s) and benefit(s) of doing nothing. The patient was provided information about the general risks and possible complications associated with the procedure. These may include, but are not  limited to: failure to achieve desired goals, infection, bleeding, organ or nerve damage, allergic reactions, paralysis, and death. In addition, the patient was informed of those risks and complications associated to the procedure, such as failure to decrease pain; infection; bleeding; organ or nerve damage  with subsequent damage to sensory, motor, and/or autonomic systems, resulting in permanent pain, numbness, and/or weakness of one or several areas of the body; allergic reactions; (i.e.: anaphylactic reaction); and/or death. Furthermore, the patient was informed of those risks and complications associated with the medications. These include, but are not limited to: allergic reactions (i.e.: anaphylactic or anaphylactoid reaction(s)); adrenal axis suppression; blood sugar elevation that in diabetics may result in ketoacidosis or comma; water retention that in patients with history of congestive heart failure may result in shortness of breath, pulmonary edema, and decompensation with resultant heart failure; weight gain; swelling or edema; medication-induced neural toxicity; particulate matter embolism and blood vessel occlusion with resultant organ, and/or nervous system infarction; and/or aseptic necrosis of one or more joints. Finally, the patient was informed that Medicine is not an exact science; therefore, there is also the possibility of unforeseen or unpredictable risks and/or possible complications that may result in a catastrophic outcome. The patient indicated having understood very clearly. We have given the patient no guarantees and we have made no promises. Enough time was given to the patient to ask questions, all of which were answered to the patient's satisfaction. Mr. [name] has indicated that he wanted to continue with the procedure. Attestation: I, the ordering provider, attest that I have discussed with the patient the benefits, risks, side-effects, alternatives, likelihood of achieving  goals, and potential problems during recovery for the procedure that I have provided informed consent. Date  Time: 01/17/2024  8:45 AM  Pre-Procedure Preparation:  Monitoring: As per clinic protocol. Respiration, ETCO2, SpO2, BP, heart rate and rhythm monitor placed and checked for adequate function Safety Precautions: Patient was assessed for positional comfort and pressure points before starting the procedure. Time-out: I initiated and conducted the Time-out before starting the procedure, as per protocol. The patient was asked to participate by confirming the accuracy of the Time Out information. Verification of the correct person, site, and procedure were performed and confirmed by me, the nursing staff, and the patient. Time-out conducted as per Joint Commission's Universal Protocol (UP.01.01.01). Time: 0900 Start Time: 0900 hrs.  Description/Narrative of Procedure:          Region: Distal lower extremity Target Area: Sensory peripheral nerves affected by diabetic peripheral neuropathy Site: Feet Approach: Percutaneous  No./Series: Not applicable  Type: Percutaneous  Purpose: Therapeutic  Region: Distal lower extremities  Start Time: 0900 hrs.  Description of the Procedure: Protocol guidelines were followed. The patient was assisted into a comfortable position.  Informed consent was obtained in the patient monitored in the usual manner.  All questions were answered prior to the procedure.  They Qutenza  patches were applied to the affected area and then covered with the wrap.  The Patient was kept under observation until the treatment was completed.  The patches were removed and the treated area was inspected.  Vitals:   01/17/24 0847  BP: (!) 143/67  Pulse: 92  Resp: 20  SpO2: 97%  Weight: 271 lb (122.9 kg)  Height: 5' 8 (1.727 m)     End Time: 0940 hrs.  Imaging Guidance:          Type of Imaging Technique: None used Indication(s): N/A Exposure Time: No patient  exposure Contrast: None used. Fluoroscopic Guidance: N/A Ultrasound Guidance: N/A Interpretation: N/A  Post-operative Assessment:  Post-procedure Vital Signs:  Pulse/HCG Rate: 92  Temp:   Resp: 20 BP: (!) 143/67 SpO2: 97 %  EBL: None  Complications: No immediate post-treatment complications observed by team,  or reported by patient.  Note: The patient tolerated the entire procedure well. A repeat set of vitals were taken after the procedure and the patient was kept under observation following institutional policy, for this type of procedure. Post-procedural neurological assessment was performed, showing return to baseline, prior to discharge. The patient was provided with post-procedure discharge instructions, including a section on how to identify potential problems. Should any problems arise concerning this procedure, the patient was given instructions to immediately contact us , at any time, without hesitation. In any case, we plan to contact the patient by telephone for a follow-up status report regarding this interventional procedure.  Comments:  No additional relevant information.  Plan of Care (POC)  Orders:  Orders Placed This Encounter  Procedures   NEUROLYSIS    Please order Qutenza  patches    Standing Status:   Future    Expected Date:   04/18/2024    Expiration Date:   01/16/2025    Where will this procedure be performed?:   [facility]     Medications ordered for procedure: Meds ordered this encounter  Medications   capsaicin  topical system 8 % patch 4 patch   Medications administered: Burnis MICAEL Penman had no medications administered during this visit.  See the medical record for exact dosing, route, and time of administration.  Follow-up plan:   Return in about 3 months (around 04/18/2024) for Qutenza .       Recent Visits No visits were found meeting these conditions. Showing recent visits within past 90 days and meeting all other  requirements Today's Visits Date Type Provider Dept  01/17/24 Procedure visit Marcelino Nurse, MD [facility]  Showing today's visits and meeting all other requirements Future Appointments No visits were found meeting these conditions. Showing future appointments within next 90 days and meeting all other requirements  Disposition: Discharge home  Discharge (Date  Time): 01/17/2024; 0945 hrs.   Primary Care Physician: [facility] Location: [facility] Note by: Nurse Marcelino, MD (TTS technology used. I apologize for any typographical errors that were not detected and corrected.) Date: 01/17/2024; Time: 10:26 AM  Disclaimer:  Medicine is not an visual merchandiser. The only guarantee in medicine is that nothing is guaranteed. It is important to note that the decision to proceed with this intervention was based on the information collected from the patient. The Data and conclusions were drawn from the patient's questionnaire, the interview, and the physical examination. Because the information was provided in large part by the patient, it cannot be guaranteed that it has not been purposely or unconsciously manipulated. Every effort has been made to obtain as much relevant data as possible for this evaluation. It is important to note that the conclusions that lead to this procedure are derived in large part from the available data. Always take into account that the treatment will also be dependent on availability of resources and existing treatment guidelines, considered by other Pain Management Practitioners as being common knowledge and practice, at the time of the intervention. For Medico-Legal purposes, it is also important to point out that variation in procedural techniques and pharmacological choices are the acceptable norm. The indications, contraindications, technique, and results of the above procedure should only be interpreted and  judged by a Board-Certified Interventional Pain Specialist with extensive familiarity and expertise in the same exact procedure and technique.

## 2024-01-17 NOTE — Progress Notes (Signed)
 Safety precautions to be maintained throughout the outpatient stay will include: orient to surroundings, keep bed in low position, maintain call bell within reach at all times, provide assistance with transfer out of bed and ambulation.

## 2024-01-17 NOTE — Patient Instructions (Signed)
 Capsaicin Topical System What is this medication? CAPSAICIN (cap SAY sin) treats nerve pain. It works by making your skin feel warm or cool, which blocks pain signals going to the brain. This medicine may be used for other purposes; ask your health care provider or pharmacist if you have questions. COMMON BRAND NAME(S): Qutenza What should I tell my care team before I take this medication? They need to know if you have any of these conditions: Have had a heart attack or stroke High blood pressure Large areas of burned or damaged skin An unusual or allergic reaction to capsaicin, hot peppers, other medications, foods, dyes, or preservatives Pregnant or trying to get pregnant Breastfeeding How should I use this medication? This medication is for external use only. It is applied by your care team in a hospital or clinic setting. Talk to your care team about the use of this medication in children. Special care may be needed. Overdosage: If you think you have taken too much of this medicine contact a poison control center or emergency room at once. NOTE: This medicine is only for you. Do not share this medicine with others. What if I miss a dose? This does not apply. What may interact with this medication? Interactions are not expected. Do not use any other skin products on the affected area without asking your care team. This list may not describe all possible interactions. Give your health care provider a list of all the medicines, herbs, non-prescription drugs, or dietary supplements you use. Also tell them if you smoke, drink alcohol, or use illegal drugs. Some items may interact with your medicine. What should I watch for while using this medication? Your condition will be monitored carefully while you are receiving this medication. Tell your care team if your symptoms do not start to get better or if they get worse. Talk to your care team about how to treat discomfort. You may place a  cooling pack from the refrigerator (not the freezer) on the area. Do not place it directly on the skin. Try not to touch the area where the patch was applied. If you do, wash your hands with soap and water right away. Your skin may be sensitive to heat for a few days after treatment. Avoid hot baths or showers, heating pads, and direct sunlight on the treated area. What side effects may I notice from receiving this medication? Side effects that you should report to your care team as soon as possible: Allergic reactions--skin rash, itching, hives, swelling of the face, lips, tongue, or throat Burning, itching, crusting, or peeling of treated skin Increase in blood pressure Numbness, decrease in sense of touch or sensation Side effects that usually do not require medical attention (report these to your care team if they continue or are bothersome): Mild skin irritation, redness, or dryness This list may not describe all possible side effects. Call your doctor for medical advice about side effects. You may report side effects to FDA at 1-800-FDA-1088. Where should I keep my medication? This medication is given in a hospital or clinic. It will not be stored at home. NOTE: This sheet is a summary. It may not cover all possible information. If you have questions about this medicine, talk to your doctor, pharmacist, or health care provider.  2024 Elsevier/Gold Standard (2023-01-27 00:00:00)

## 2024-01-18 ENCOUNTER — Telehealth: Payer: Self-pay | Admitting: *Deleted

## 2024-01-18 NOTE — Telephone Encounter (Signed)
 Post procedure call;  qutenza  Left foot.  Voicemail unable to be left.

## 2024-01-22 ENCOUNTER — Ambulatory Visit: Admitting: Student in an Organized Health Care Education/Training Program

## 2024-02-05 ENCOUNTER — Inpatient Hospital Stay: Admitting: Internal Medicine

## 2024-02-05 ENCOUNTER — Encounter: Payer: Self-pay | Admitting: Internal Medicine

## 2024-02-05 ENCOUNTER — Ambulatory Visit

## 2024-02-05 ENCOUNTER — Inpatient Hospital Stay: Attending: Internal Medicine

## 2024-02-05 VITALS — BP 129/66 | HR 72 | Resp 16

## 2024-02-05 VITALS — BP 148/67 | HR 76 | Temp 97.6°F | Resp 17 | Ht 68.0 in | Wt 276.5 lb

## 2024-02-05 DIAGNOSIS — D509 Iron deficiency anemia, unspecified: Secondary | ICD-10-CM

## 2024-02-05 DIAGNOSIS — Z79899 Other long term (current) drug therapy: Secondary | ICD-10-CM | POA: Diagnosis not present

## 2024-02-05 DIAGNOSIS — D649 Anemia, unspecified: Secondary | ICD-10-CM

## 2024-02-05 LAB — CBC WITH DIFFERENTIAL (CANCER CENTER ONLY)
Abs Immature Granulocytes: 0.05 K/uL (ref 0.00–0.07)
Basophils Absolute: 0.1 K/uL (ref 0.0–0.1)
Basophils Relative: 1 %
Eosinophils Absolute: 0.3 K/uL (ref 0.0–0.5)
Eosinophils Relative: 5 %
HCT: 38.1 % — ABNORMAL LOW (ref 39.0–52.0)
Hemoglobin: 12.7 g/dL — ABNORMAL LOW (ref 13.0–17.0)
Immature Granulocytes: 1 %
Lymphocytes Relative: 29 %
Lymphs Abs: 1.9 K/uL (ref 0.7–4.0)
MCH: 29.4 pg (ref 26.0–34.0)
MCHC: 33.3 g/dL (ref 30.0–36.0)
MCV: 88.2 fL (ref 80.0–100.0)
Monocytes Absolute: 0.8 K/uL (ref 0.1–1.0)
Monocytes Relative: 12 %
Neutro Abs: 3.5 K/uL (ref 1.7–7.7)
Neutrophils Relative %: 52 %
Platelet Count: 261 K/uL (ref 150–400)
RBC: 4.32 MIL/uL (ref 4.22–5.81)
RDW: 14.3 % (ref 11.5–15.5)
WBC Count: 6.6 K/uL (ref 4.0–10.5)
nRBC: 0 % (ref 0.0–0.2)

## 2024-02-05 LAB — CMP (CANCER CENTER ONLY)
ALT: 18 U/L (ref 0–44)
AST: 21 U/L (ref 15–41)
Albumin: 4 g/dL (ref 3.5–5.0)
Alkaline Phosphatase: 82 U/L (ref 38–126)
Anion gap: 12 (ref 5–15)
BUN: 7 mg/dL — ABNORMAL LOW (ref 8–23)
CO2: 25 mmol/L (ref 22–32)
Calcium: 9.2 mg/dL (ref 8.9–10.3)
Chloride: 100 mmol/L (ref 98–111)
Creatinine: 0.82 mg/dL (ref 0.61–1.24)
GFR, Estimated: >60 mL/min (ref >60)
Glucose, Bld: 145 mg/dL — ABNORMAL HIGH (ref 70–99)
Potassium: 4 mmol/L (ref 3.5–5.1)
Sodium: 137 mmol/L (ref 135–145)
Total Bilirubin: 0.5 mg/dL (ref 0.0–1.2)
Total Protein: 6.9 g/dL (ref 6.5–8.1)

## 2024-02-05 LAB — FERRITIN: Ferritin: 80 ng/mL (ref 24–336)

## 2024-02-05 LAB — IRON AND TIBC
Iron: 64 ug/dL (ref 45–182)
Saturation Ratios: 21 % (ref 17.9–39.5)
TIBC: 298 ug/dL (ref 250–450)
UIBC: 234 ug/dL

## 2024-02-05 MED ORDER — SODIUM CHLORIDE 0.9 % IV SOLN
510.0000 mg | Freq: Once | INTRAVENOUS | Status: AC
  Administered 2024-02-05: 510 mg via INTRAVENOUS
  Filled 2024-02-05: qty 510

## 2024-02-05 MED ORDER — SODIUM CHLORIDE 0.9 % IV SOLN
INTRAVENOUS | Status: DC
  Filled 2024-02-05: qty 250

## 2024-02-05 NOTE — Assessment & Plan Note (Addendum)
#   Iron deficiency anemia -  Colonoscopy and endoscopy was performed by Dr. Therisa on 01/14/2022.  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 Two polyps removed from ascending colon showing tubular adenomas.  # Hemoglobin is 12 today-continue oral iron.  Iron studies-pending Proceed with Feraheme   today   # B12 deficiency: -Celiac panel was negative; -Continue with B12 1000 mcg daily.  B12 improved to around 600.   # H pylori positive: -Triple drug therapy on 11/17/2021 by GI.   # Joint pain-on narcotics defer to Dr. Lazarus.  Will hold off prescribing any narcotics.  # DISPOSITION: # ferrahem-  today # follow up in 6 months- MD; labs- bc/cmp; iron studies; ferritin- possible ferrahem- Dr.B

## 2024-02-05 NOTE — Progress Notes (Signed)
 Patient has no concerns

## 2024-02-05 NOTE — Patient Instructions (Signed)

## 2024-02-05 NOTE — Progress Notes (Signed)
 [facility] CONSULT NOTE  Patient Care Team: Center, [facility] as PCP - General (General Practice) Rennie Cindy SAUNDERS, MD as Consulting Physician (Oncology)  CHIEF COMPLAINTS/PURPOSE OF CONSULTATION: anemia  Oncology History   No history exists.    HISTORY OF PRESENTING ILLNESS: Patient ambulating-independently Alone   Randy [name] 73 y.o.  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   Discussed the use of AI scribe software for clinical note transcription with the patient, who gave verbal consent to proceed.  History of Present Illness   Randy [name] is a 73 year old male who presents for a follow-up visit regarding his anemia.  He has hemoglobin levels at 12.7 g/dL, consistent with previous results. He has been taking oral iron supplements regularly without any reported issues. In August, he received two iron infusions.  No adverse effects have been reported from the oral iron therapy.     Review of Systems  Constitutional:  Positive for malaise/fatigue. Negative for chills, diaphoresis, fever and weight loss.  HENT:  Negative for nosebleeds and sore throat.   Eyes:  Negative for double vision.  Respiratory:  Negative for cough, hemoptysis, sputum production, shortness of breath and wheezing.   Cardiovascular:  Negative for chest pain, palpitations, orthopnea and leg swelling.  Gastrointestinal:  Negative for abdominal pain, blood in stool, constipation, diarrhea, heartburn, melena, nausea and vomiting.  Genitourinary:  Negative for dysuria, frequency and urgency.  Musculoskeletal:  Positive for joint pain. Negative for back pain.  Skin: Negative.  Negative for itching and rash.  Neurological:  Negative for dizziness, tingling, focal weakness, weakness and headaches.  Endo/Heme/Allergies:  Does not bruise/bleed easily.  Psychiatric/Behavioral:  Negative for  depression. The patient is not nervous/anxious and does not have insomnia.     MEDICAL HISTORY:  Past Medical History:  Diagnosis Date   Diabetes mellitus without complication (HCC)    Hypercholesteremia    Hypertension    Sleep apnea     SURGICAL HISTORY: Past Surgical History:  Procedure Laterality Date   COLONOSCOPY WITH PROPOFOL  N/A 01/04/2022   Procedure: COLONOSCOPY WITH PROPOFOL ;  Surgeon: Therisa Bi, MD;  Location: [facility] ENDOSCOPY;  Service: Gastroenterology;  Laterality: N/A;   ESOPHAGOGASTRODUODENOSCOPY N/A 01/04/2022   Procedure: ESOPHAGOGASTRODUODENOSCOPY (EGD);  Surgeon: Therisa Bi, MD;  Location: [facility] ENDOSCOPY;  Service: Gastroenterology;  Laterality: N/A;   HERNIA REPAIR      SOCIAL HISTORY: Social History   Socioeconomic History   Marital status: Single    Spouse name: Not on file   Number of children: Not on file   Years of education: Not on file   Highest education level: Not on file  Occupational History   Not on file  Tobacco Use   Smoking status: Former    Current packs/day: 0.00    Average packs/day: 1 pack/day for 25.0 years (25.0 ttl pk-yrs)    Types: Cigarettes    Start date: 03/01/1967    Quit date: 02/29/1992    Years since quitting: 31.9    Passive exposure: Past   Smokeless tobacco: Never  Vaping Use   Vaping status: Never Used  Substance and Sexual Activity   Alcohol use: Yes    Comment: infreqently   Drug use: No   Sexual activity: Yes  Other Topics Concern   Not on file  Social History Narrative   Not on file   Social  Drivers of Corporate Investment Banker Strain: Low Risk  (04/18/2023)   Received from [facility]   Overall Financial Resource Strain (CARDIA)    Difficulty of Paying Living Expenses: Not very hard  Food Insecurity: No Food Insecurity (04/18/2023)   Received from [facility]   Hunger Vital Sign    Within the past 12 months, you worried that your food would run out before you got  the money to buy more.: Never true    Within the past 12 months, the food you bought just didn't last and you didn't have money to get more.: Never true  Transportation Needs: No Transportation Needs (04/18/2023)   Received from [facility] - Transportation    In the past 12 months, has lack of transportation kept you from medical appointments or from getting medications?: No    Lack of Transportation (Non-Medical): No  Physical Activity: Not on file  Stress: Not on file  Social Connections: Not on file  Intimate Partner Violence: Not on file    FAMILY HISTORY: Family History  Problem Relation Age of Onset   Prostate cancer Neg Hx    Bladder Cancer Neg Hx    Kidney cancer Neg Hx     ALLERGIES:  has no known allergies.  MEDICATIONS:  Current Outpatient Medications  Medication Sig Dispense Refill   ACCU-CHEK AVIVA PLUS test strip      albuterol  (VENTOLIN  HFA) 108 (90 Base) MCG/ACT inhaler Inhale into the lungs.     allopurinol (ZYLOPRIM) 100 MG tablet Take 1 tablet by mouth daily.     aspirin 81 MG tablet Take 81 mg by mouth daily.     atorvastatin (LIPITOR) 40 MG tablet Take 40 mg by mouth daily.     BD INSULIN SYRINGE U/F 31G X 5/16 0.5 ML MISC 4 (four) times daily.     cyanocobalamin  (VITAMIN B12) 1000 MCG tablet Take 1,000 mcg by mouth daily.     cyclobenzaprine  (FLEXERIL ) 5 MG tablet Take 0.5 tablets (2.5 mg total) by mouth 3 (three) times daily as needed for muscle spasms. 30 tablet 0   ferrous sulfate  325 (65 FE) MG tablet Take 1 tablet (325 mg total) by mouth every other day. 45 tablet 2   gabapentin  (NEURONTIN ) 400 MG capsule Take 1 capsule (400 mg total) by mouth every 8 (eight) hours. 90 capsule 2   insulin glargine (LANTUS) 100 UNIT/ML injection Inject 50 Units into the skin 2 (two) times daily.     insulin lispro (HUMALOG) 100 UNIT/ML injection Inject 30 Units into the skin 3 (three) times daily with meals.     JARDIANCE 10 MG TABS tablet  Take 10 mg by mouth daily.     lidocaine  (XYLOCAINE ) 5 % ointment Apply 1 Application topically daily as needed.     lisinopril (PRINIVIL,ZESTRIL) 10 MG tablet Take 10 mg by mouth daily.     omeprazole  (PRILOSEC) 20 MG capsule Take 20 mg by mouth daily.     oxyCODONE -acetaminophen  (PERCOCET/ROXICET) 5-325 MG tablet Take 1 tablet by mouth every 4 (four) hours as needed for severe pain. 30 tablet 0   tamsulosin (FLOMAX) 0.4 MG CAPS capsule TAKE 1 CAPSULE BY MOUTH EVERY DAY 30 MINUTES AFTER THE SAME MEAL     No current facility-administered medications for this visit.   Facility-Administered Medications Ordered in Other Visits  Medication Dose Route Frequency Provider Last Rate Last Admin   0.9 %  sodium chloride  infusion  Intravenous Continuous Daiquan Resnik R, MD 10 mL/hr at 02/05/24 1045 New Bag at 02/05/24 1045   ferumoxytol  (FERAHEME ) 510 mg in sodium chloride  0.9 % 100 mL IVPB  510 mg Intravenous Once Deigo Alonso R, MD        PHYSICAL EXAMINATION:   Vitals:   02/05/24 0949  BP: (!) 148/67  Pulse: 76  Resp: 17  Temp: 97.6 F (36.4 C)  SpO2: 97%   Filed Weights   02/05/24 0949  Weight: 276 lb 8 oz (125.4 kg)    Physical Exam Vitals and nursing note reviewed.  HENT:     Head: Normocephalic and atraumatic.     Mouth/Throat:     Pharynx: Oropharynx is clear.  Eyes:     Extraocular Movements: Extraocular movements intact.     Pupils: Pupils are equal, round, and reactive to light.  Cardiovascular:     Rate and Rhythm: Normal rate and regular rhythm.  Pulmonary:     Comments: Decreased breath sounds bilaterally.  Abdominal:     Palpations: Abdomen is soft.  Musculoskeletal:        General: Normal range of motion.     Cervical back: Normal range of motion.  Skin:    General: Skin is warm.  Neurological:     General: No focal deficit present.     Mental Status: He is alert and oriented to person, place, and time.  Psychiatric:        Behavior: Behavior  normal.        Judgment: Judgment normal.     LABORATORY DATA:  I have reviewed the data as listed Lab Results  Component Value Date   WBC 6.6 02/05/2024   HGB 12.7 (L) 02/05/2024   HCT 38.1 (L) 02/05/2024   MCV 88.2 02/05/2024   PLT 261 02/05/2024   Recent Labs    03/28/23 1408 10/30/23 0145  NA 132* 137  K 3.8 3.5  CL 98 100  CO2 23 27  GLUCOSE 227* 89  BUN 12 7*  CREATININE 0.81 0.81  CALCIUM 8.7* 9.1  GFRNONAA >60 >60    RADIOGRAPHIC STUDIES: I have personally reviewed the radiological images as listed and agreed with the findings in the report. No results found.   Symptomatic anemia # Iron deficiency anemia -  Colonoscopy and endoscopy was performed by Dr. Therisa on 01/14/2022.  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 Two polyps removed from ascending colon showing tubular adenomas.  # Hemoglobin is 12 today-continue oral iron.  Iron studies-pending Proceed with Feraheme   today   # B12 deficiency: -Celiac panel was negative; -Continue with B12 1000 mcg daily.  B12 improved to around 600.   # H pylori positive: -Triple drug therapy on 11/17/2021 by GI.   # Joint pain-on narcotics defer to Dr. Lazarus.  Will hold off prescribing any narcotics.  # DISPOSITION: # ferrahem-  today # follow up in 6 months- MD; labs- bc/cmp; iron studies; ferritin- possible ferrahem- Dr.B  Above plan of care was discussed with patient/family in detail.  My contact information was given to the patient/family.     Cindy JONELLE Joe, MD 02/05/2024 10:47 AM

## 2024-03-05 ENCOUNTER — Telehealth: Payer: Self-pay | Admitting: Student in an Organized Health Care Education/Training Program

## 2024-03-05 NOTE — Telephone Encounter (Signed)
 Needs eval appt.  He can see Seema.

## 2024-03-05 NOTE — Telephone Encounter (Signed)
 Patient states he is in pain and needs something for the pain in his lower leg. He is scheduled for [facility] in Feb. Please advise patient

## 2024-03-05 NOTE — Telephone Encounter (Signed)
 Pt stated that he has appt on the 18th but he is in so much pain that he cannot wait.

## 2024-03-05 NOTE — Telephone Encounter (Signed)
 Can we offer him Toradol /Methocarbamol?

## 2024-03-06 NOTE — Progress Notes (Signed)
 PROVIDER NOTE: Interpretation of information contained herein should be left to medically-trained personnel. Specific patient instructions are provided elsewhere under Patient Instructions section of medical record. This document was created in part using AI and STT-dictation technology, any transcriptional errors that may result from this process are unintentional.  Patient: Randy [name]  Service: E/M   [facility]P: [facility]  DOB: 1950-08-09  DOS: 03/07/2024  Provider: Emmy MARLA Blanch, NP  MRN: [id number]  Delivery: Face-to-face  Specialty: Interventional Pain Management  Type: Established Patient  Setting: Ambulatory outpatient facility  Specialty designation: 09  Referring Prov.: [facility]  Location: Outpatient office facility       History of present illness (HPI) Mr. JAVARIE CRISP, a 74 y.o. 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 Mr. Beretta's primary complain today is Foot Pain  Pertinent problems: Mr. Armond has Osteoarthritis of ankle; Diabetes (HCC); Degenerative joint disease (DJD) of lumbar spine; Chronic painful diabetic neuropathy (HCC); Neuropathic pain of both feet; Neuropathic pain; Pain in joint, ankle and foot; and Pain in left foot on their pertinent problem list.  Pain Assessment: Severity of Chronic pain is reported as a 10-Worst pain ever/10. Location: Foot Left/Radiates up into let leg. Onset: More than a month ago. Quality: Burning. Timing: Constant. Modifying factor(s): Denies. Vitals:  height is 5' 8 (1.727 m) and weight is 271 lb (122.9 kg). His temporal temperature is 97.3 F (36.3 C) (abnormal). His blood pressure is 143/69 (abnormal) and his pulse is 85. His respiration is 20 and oxygen saturation is 97%.  BMI: Estimated body mass index is 41.21 kg/m as calculated from the following:   Height as of this encounter: 5' 8 (1.727 m).   Weight as of this encounter:  271 lb (122.9 kg).  Last encounter: Visit date not found. Last procedure: Visit date not found.  Reason for encounter: evaluation of worsening, or previously known (established) problem and postprocedure evaluation.   Discussed the use of AI scribe software for clinical note transcription with the patient, who gave verbal consent to proceed.  History of Present Illness   Randy [name] is a 74 year old male who presents with pain and burning sensation in the leg following Qutenza  treatment.  He developed a red spot on his leg after the last Qutenza  treatment in November. The spot became redder and started to hurt and burn approximately two to three days post-treatment. He describes the sensation as burning with tingling and pain radiating up his leg.  He is currently taking gabapentin , 400 mg in the morning and 800 mg at night. While gabapentin  provided some relief initially, it has not been effective in managing his symptoms recently. He has experienced difficulty sleeping since the last treatment.     Procedure Type: Qutenza  Neurolysis #4  Laterality:  Left Area treated: Feet Imaging Guidance: None Anesthesia/analgesia/anxiolysis/sedation: None required Medication (Right): Qutenza  (capsaicin  8%) topical system Medication (Left): Qutenza  (capsaicin  8%) topical system Date: 01/17/2024 Performed by: Wallie Sherry, MD Rationale (medical necessity): procedure needed and proper for the treatment of Mr. Gambill's medical symptoms and needs. 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 1. Chronic painful diabetic neuropathy (HCC)       NAS-11 Pain score:        Pre-procedure: 10-Worst pain ever/10        Post-procedure: 10-Worst pain ever/10   Post-Procedure Evaluation  Effectiveness:  Initial hour after procedure: 80 % . Subsequent 4-6 hours post-procedure: 40 % . Analgesia past initial 6 hours: 80  %  Ongoing improvement:  Analgesic:   80 %  Function: Mr. Pasquariello reports improvement in function ROM: Mr. Lederman reports improvement in ROM Interpretation: Mr. Vicuna received a Qutenza  treatment on January 17, 2024.  He reports initially 80% pain relief, followed sustained ongoing 80% pain relief and symptoms improvement, however, he reports the procedure does provide short-term pain relief and neuropathic symptoms back to baseline. Pharmacotherapy Assessment   Nonformulary compound cream for neuropathics symptoms (Compounded cream: 6% Gabapentin , 2.5% Lidocaine , 0.5% Meloxicam , 5% Methocarbamol, 2.5% Prilocaine.) Monitoring: [place] PMP: PDMP not reviewed this encounter.       Pharmacotherapy: No side-effects or adverse reactions reported. Compliance: No problems identified. Effectiveness: Clinically acceptable.  Randy [name], [place]  03/07/2024 10:38 AM  Sign when Signing Visit Safety precautions to be maintained throughout the outpatient stay will include: orient to surroundings, keep bed in low position, maintain call bell within reach at all times, provide assistance with transfer out of bed and ambulation.     UDS:  No results found for: SUMMARY  No results found for: CBDTHCR No results found for: D8THCCBX No results found for: D9THCCBX  ROS  Constitutional: Denies any fever or chills Gastrointestinal: No reported hemesis, hematochezia, vomiting, or acute GI distress Musculoskeletal: Bilateral foot pain Neurological: No reported episodes of acute onset apraxia, aphasia, dysarthria, agnosia, amnesia, paralysis, loss of coordination, or loss of consciousness  Medication Review  Insulin Syringe-Needle U-100, NONFORMULARY OR COMPOUNDED ITEM, albuterol , allopurinol, aspirin, atorvastatin, cyanocobalamin , cyclobenzaprine , empagliflozin, ferrous sulfate , gabapentin , glucose blood, insulin glargine, insulin lispro, lidocaine , lisinopril, omeprazole , oxyCODONE -acetaminophen , and  tamsulosin  History Review  Allergy: Mr. Randy [name] has no known allergies. Drug: Mr. Randy [name]  reports no history of drug use. Alcohol:  reports current alcohol use. Tobacco:  reports that he quit smoking about 32 years ago. His smoking use included cigarettes. He started smoking about 57 years ago. He has a 25 pack-year smoking history. He has been exposed to tobacco smoke. He has never used smokeless tobacco. Social: Mr. Shanahan  reports that he quit smoking about 32 years ago. His smoking use included cigarettes. He started smoking about 57 years ago. He has a 25 pack-year smoking history. He has been exposed to tobacco smoke. He has never used smokeless tobacco. He reports current alcohol use. He reports that he does not use drugs. Medical:  has a past medical history of Diabetes mellitus without complication (HCC), Hypercholesteremia, Hypertension, and Sleep apnea. Surgical: Mr. Engelhard  has a past surgical history that includes Hernia repair; Esophagogastroduodenoscopy (N/A, 01/04/2022); and Colonoscopy with propofol  (N/A, 01/04/2022). Family: family history is not on file.  Laboratory Chemistry Profile   Renal Lab Results  Component Value Date   BUN 7 (L) 02/05/2024   CREATININE 0.82 02/05/2024   GFRAA >60 03/09/2018   GFRNONAA >60 02/05/2024    Hepatic Lab Results  Component Value Date   AST 21 02/05/2024   ALT 18 02/05/2024   ALBUMIN 4.0 02/05/2024   ALKPHOS 82 02/05/2024   LIPASE 163 (H) 11/11/2020    Electrolytes Lab Results  Component Value Date   NA 137 02/05/2024   K 4.0 02/05/2024   CL 100 02/05/2024   CALCIUM 9.2 02/05/2024    Bone No results found for: VD25OH, VD125OH2TOT, CI6874NY7, CI7874NY7, 25OHVITD1, 25OHVITD2, 25OHVITD3, TESTOFREE, TESTOSTERONE  Inflammation (CRP: Acute Phase) (ESR: Chronic Phase) No results found for: CRP,  ESRSEDRATE, LATICACIDVEN       Note: Above Lab results reviewed.  Recent Imaging Review  CT C-SPINE NO  CHARGE CLINICAL DATA:  Initial evaluation for acute neck pain.  EXAM: CT CERVICAL SPINE WITHOUT CONTRAST  TECHNIQUE: Multidetector CT imaging of the cervical spine was performed without intravenous contrast. Multiplanar CT image reconstructions were also generated.  RADIATION DOSE REDUCTION: This exam was performed according to the departmental dose-optimization program which includes automated exposure control, adjustment of the mA and/or kV according to patient size and/or use of iterative reconstruction technique.  COMPARISON:  None Available.  FINDINGS: Alignment: Straightening with mild smooth reversal of the normal cervical lordosis. Trace facet mediated anterolisthesis of C3 on C4.  Skull base and vertebrae: Skull base intact. Normal C1-2 articulations are preserved and the dens is intact. Vertebral body heights maintained. No acute fracture.  Soft tissues and spinal canal: Soft tissues of the neck demonstrate no acute finding. No abnormal prevertebral edema. Spinal canal within normal limits.  Disc levels: Mild degenerative disc disease at C4-5 through C6-7. Mild-to-moderate multilevel facet hypertrophy, most pronounced at C3-4 on the left. No significant spinal stenosis by CT.  Upper chest: Visualized upper chest demonstrates no acute finding. Partially visualized lung apices are clear.  Other: None.  IMPRESSION: 1. No acute osseous abnormality within the cervical spine. 2. Mild degenerative disc disease at C4-5 through C6-7. 3. Mild-to-moderate multilevel facet hypertrophy, most pronounced at C3-4 on the left.  Electronically Signed   By: Morene Hoard M.D.   On: 10/30/2023 03:31 CT Head Wo Contrast CLINICAL DATA:  Initial evaluation for acute headache.  EXAM: CT HEAD WITHOUT CONTRAST  TECHNIQUE: Contiguous axial images were obtained from the base of the skull through the vertex without intravenous contrast.  RADIATION DOSE REDUCTION: This  exam was performed according to the departmental dose-optimization program which includes automated exposure control, adjustment of the mA and/or kV according to patient size and/or use of iterative reconstruction technique.  COMPARISON:  Prior study from 05/19/2021  FINDINGS: Brain: Generalized age-related cerebral atrophy. Patchy hypodensity involving the supratentorial cerebral white matter, consistent with chronic small vessel ischemic disease, moderate in nature. No acute intracranial hemorrhage. No acute large vessel territory infarct. No mass lesion or midline shift. No hydrocephalus or extra-axial fluid collection.  Vascular: No abnormal hyperdense vessel. Calcified atherosclerosis present at the skull base.  Skull: Scalp soft tissues demonstrate no acute finding. Calvarium intact.  Sinuses/Orbits: Globes and orbital soft tissues within normal limits. Mild mucosal thickening present about the ethmoidal air cells. Paranasal sinuses are otherwise largely clear. No mastoid effusion.  Other: None.  IMPRESSION: 1. No acute intracranial abnormality. 2. Generalized age-related cerebral atrophy with moderate chronic small vessel ischemic disease.  Electronically Signed   By: Morene Hoard M.D.   On: 10/30/2023 03:20 CT Soft Tissue Neck W Contrast CLINICAL DATA:  Initial evaluation for acute neck pain, epiglottitis or tonsillitis suspected.  EXAM: CT NECK WITH CONTRAST  TECHNIQUE: Multidetector CT imaging of the neck was performed using the standard protocol following the bolus administration of intravenous contrast.  RADIATION DOSE REDUCTION: This exam was performed according to the departmental dose-optimization program which includes automated exposure control, adjustment of the mA and/or kV according to patient size and/or use of iterative reconstruction technique.  CONTRAST:  75mL OMNIPAQUE  IOHEXOL  300 MG/ML  SOLN  COMPARISON:  None  Available.  FINDINGS: Pharynx and larynx: Oral cavity within normal limits. No acute abnormality about the dentition. [place]atine tonsils fairly symmetric and within normal  limits. No evidence for acute tonsillitis. Remainder of the oropharynx and nasopharynx within normal limits. No retropharyngeal collection or swelling. Epiglottis within normal limits. Hypopharynx and supraglottic larynx within normal limits. Glottis symmetric without abnormality. Subglottic airway clear.  Salivary glands: Salivary glands including the parotid and submandibular glands are within normal limits.  Thyroid: Normal.  Lymph nodes: No enlarged or pathologic lymph nodes within the neck.  Vascular: Normal intravascular enhancement seen within the neck. Atheromatous change about the aortic arch, carotid bifurcations, and skull base.  Limited intracranial: No acute finding.  Visualized orbits: Globes orbital soft tissues within normal limits.  Mastoids and visualized paranasal sinuses: Scattered mucosal thickening present about the ethmoidal air cells. Paranasal sinuses are otherwise clear. Mastoid air cells and middle ear cavities are clear.  Skeleton: No discrete or worrisome osseous lesions. Mild cervical spondylosis for age.  Upper chest: Focal atelectasis and/or scarring present at the medial left upper lobe. No other acute finding.  Other: 1.4 cm sebaceous cyst noted within the left face.  IMPRESSION: Negative CT of the neck. No acute inflammatory changes or other abnormality identified.  Aortic Atherosclerosis (ICD10-I70.0).  Electronically Signed   By: Morene Hoard M.D.   On: 10/30/2023 03:13 Note: Reviewed        Physical Exam  Vitals: BP (!) 143/69 (BP Location: Right Arm, Patient Position: Sitting, Cuff Size: Normal)   Pulse 85   Temp (!) 97.3 F (36.3 C) (Temporal)   Resp 20   Ht 5' 8 (1.727 m)   Wt 271 lb (122.9 kg)   SpO2 97%   BMI 41.21 kg/m  BMI: Estimated  body mass index is 41.21 kg/m as calculated from the following:   Height as of this encounter: 5' 8 (1.727 m).   Weight as of this encounter: 271 lb (122.9 kg). Ideal: Ideal body weight: 68.4 kg (150 lb 12.7 oz) Adjusted ideal body weight: 90.2 kg (198 lb 14 oz) General appearance: Well nourished, well developed, and well hydrated. In no apparent acute distress Mental status: Alert, oriented x 3 (person, place, & time)       Respiratory: No evidence of acute respiratory distress Eyes: PERLA  Musculoskeletal : bilateral foot pain Assessment   Diagnosis Status  1. Chronic painful diabetic neuropathy (HCC)   2. Superficial peroneal nerve neuropathy, left   3. Left foot pain   4. Neuropathic pain of both feet   5. Neuropathic pain    Persistent Controlled Controlled   Updated Problems: No problems updated.   Plan of Care  Problem-specific:  Assessment and Plan    Neuropathic pain of the left foot Chronic neuropathic pain with burning and tingling, worsened post-Qutenza . Gabapentin  provides limited relief, affecting sleep. - Started on Compounded cream: 6% Gabapentin , 2.5% Lidocaine , 0.5% Meloxicam , 5% Methocarbamol, 2.5% Prilocaine.  The patient was advised if the prescribed medication is costly then, he can get over the counter Capsaicin  cream or gel.       Mr. KALETH KOY has a current medication list which includes the following long-term medication(s): atorvastatin, ferrous sulfate , gabapentin , insulin lispro, and lisinopril.  Pharmacotherapy (Medications Ordered): Meds ordered this encounter  Medications   NONFORMULARY OR COMPOUNDED ITEM    Sig: Sig: Apply 1-2 gm(s) (2-4 pumps) to affected area, 3-4 times/day. (1 pump = 0.5 gm)    Dispense:  1 each    Refill:  2    Compounded cream: 6% Gabapentin , 2.5% Lidocaine , 0.5% Meloxicam , 5% Methocarbamol, 2.5% Prilocaine. Dispense: 120 gm Pump Bottle. (Dispenser:  1 pump = 0.5 gm.)   Orders:  No orders of the defined  types were placed in this encounter.       No follow-ups on file.    Recent Visits Date Type Provider Dept  01/17/24 Procedure visit Marcelino Nurse, MD [facility]  Showing recent visits within past 90 days and meeting all other requirements Today's Visits Date Type Provider Dept  03/07/24 Office Visit Annai Heick K, NP [facility]  Showing today's visits and meeting all other requirements Future Appointments Date Type Provider Dept  04/17/24 Appointment Marcelino Nurse, MD [facility]  Showing future appointments within next 90 days and meeting all other requirements  I discussed the assessment and treatment plan with the patient. The patient was provided an opportunity to ask questions and all were answered. The patient agreed with the plan and demonstrated an understanding of the instructions.  Patient advised to call back or seek an in-person evaluation if the symptoms or condition worsens.  I personally spent a total of 30 minutes in the care of the patient today including preparing to see the patient, getting/reviewing separately obtained history, performing a medically appropriate exam/evaluation, counseling and educating, placing orders, referring and communicating with other health care professionals, documenting clinical information in the EHR, independently interpreting results, communicating results, and coordinating care.   Note by: Joee Iovine K Tirzah Fross, NP (TTS and AI technology used. I apologize for any typographical errors that were not detected and corrected.) Date: 03/07/2024; Time: 11:48 AM

## 2024-03-07 ENCOUNTER — Ambulatory Visit: Attending: Nurse Practitioner | Admitting: Nurse Practitioner

## 2024-03-07 ENCOUNTER — Encounter: Payer: Self-pay | Admitting: Nurse Practitioner

## 2024-03-07 VITALS — BP 143/69 | HR 85 | Temp 97.3°F | Resp 20 | Ht 68.0 in | Wt 271.0 lb

## 2024-03-07 DIAGNOSIS — E1140 Type 2 diabetes mellitus with diabetic neuropathy, unspecified: Secondary | ICD-10-CM | POA: Diagnosis present

## 2024-03-07 DIAGNOSIS — Z794 Long term (current) use of insulin: Secondary | ICD-10-CM

## 2024-03-07 DIAGNOSIS — G5732 Lesion of lateral popliteal nerve, left lower limb: Secondary | ICD-10-CM | POA: Diagnosis present

## 2024-03-07 DIAGNOSIS — M792 Neuralgia and neuritis, unspecified: Secondary | ICD-10-CM | POA: Insufficient documentation

## 2024-03-07 DIAGNOSIS — G5793 Unspecified mononeuropathy of bilateral lower limbs: Secondary | ICD-10-CM | POA: Diagnosis present

## 2024-03-07 DIAGNOSIS — M79672 Pain in left foot: Secondary | ICD-10-CM | POA: Diagnosis present

## 2024-03-07 MED ORDER — NONFORMULARY OR COMPOUNDED ITEM: 2 refills | Status: AC

## 2024-03-07 NOTE — Progress Notes (Signed)
 Safety precautions to be maintained throughout the outpatient stay will include: orient to surroundings, keep bed in low position, maintain call bell within reach at all times, provide assistance with transfer out of bed and ambulation.

## 2024-04-17 ENCOUNTER — Ambulatory Visit: Admitting: Student in an Organized Health Care Education/Training Program

## 2024-08-07 ENCOUNTER — Inpatient Hospital Stay

## 2024-08-07 ENCOUNTER — Inpatient Hospital Stay: Admitting: Internal Medicine
